# Patient Record
Sex: Female | Born: 1950 | Race: Asian | Hispanic: No | Marital: Married | Smoking: Never smoker
Health system: Southern US, Community
[De-identification: ages and names within clinical notes are randomized; demographics above are authoritative.]

## PROBLEM LIST (undated history)

## (undated) DIAGNOSIS — E785 Hyperlipidemia, unspecified: Secondary | ICD-10-CM

## (undated) DIAGNOSIS — K317 Polyp of stomach and duodenum: Secondary | ICD-10-CM

## (undated) DIAGNOSIS — K602 Anal fissure, unspecified: Secondary | ICD-10-CM

## (undated) DIAGNOSIS — N390 Urinary tract infection, site not specified: Secondary | ICD-10-CM

## (undated) DIAGNOSIS — K219 Gastro-esophageal reflux disease without esophagitis: Secondary | ICD-10-CM

## (undated) DIAGNOSIS — E039 Hypothyroidism, unspecified: Secondary | ICD-10-CM

## (undated) DIAGNOSIS — M1990 Unspecified osteoarthritis, unspecified site: Secondary | ICD-10-CM

## (undated) DIAGNOSIS — K5790 Diverticulosis of intestine, part unspecified, without perforation or abscess without bleeding: Secondary | ICD-10-CM

## (undated) DIAGNOSIS — K589 Irritable bowel syndrome without diarrhea: Secondary | ICD-10-CM

## (undated) DIAGNOSIS — M8580 Other specified disorders of bone density and structure, unspecified site: Secondary | ICD-10-CM

## (undated) DIAGNOSIS — K2970 Gastritis, unspecified, without bleeding: Secondary | ICD-10-CM

## (undated) DIAGNOSIS — F419 Anxiety disorder, unspecified: Secondary | ICD-10-CM

## (undated) DIAGNOSIS — K2270 Barrett's esophagus without dysplasia: Secondary | ICD-10-CM

## (undated) DIAGNOSIS — K8020 Calculus of gallbladder without cholecystitis without obstruction: Secondary | ICD-10-CM

## (undated) HISTORY — PX: DILATION AND CURETTAGE OF UTERUS: SHX78

## (undated) HISTORY — DX: Barrett's esophagus without dysplasia: K22.70

## (undated) HISTORY — DX: Gastritis, unspecified, without bleeding: K29.70

## (undated) HISTORY — PX: CATARACT EXTRACTION: SUR2

## (undated) HISTORY — DX: Anal fissure, unspecified: K60.2

## (undated) HISTORY — DX: Hypothyroidism, unspecified: E03.9

## (undated) HISTORY — DX: Other specified disorders of bone density and structure, unspecified site: M85.80

## (undated) HISTORY — DX: Diverticulosis of intestine, part unspecified, without perforation or abscess without bleeding: K57.90

## (undated) HISTORY — DX: Irritable bowel syndrome, unspecified: K58.9

## (undated) HISTORY — DX: Calculus of gallbladder without cholecystitis without obstruction: K80.20

## (undated) HISTORY — PX: TUBAL LIGATION: SHX77

## (undated) HISTORY — DX: Anxiety disorder, unspecified: F41.9

## (undated) HISTORY — DX: Gastro-esophageal reflux disease without esophagitis: K21.9

## (undated) HISTORY — DX: Hyperlipidemia, unspecified: E78.5

## (undated) HISTORY — DX: Urinary tract infection, site not specified: N39.0

## (undated) HISTORY — DX: Polyp of stomach and duodenum: K31.7

---

## 1998-07-20 ENCOUNTER — Other Ambulatory Visit: Admission: RE | Admit: 1998-07-20 | Discharge: 1998-07-20 | Payer: Self-pay | Admitting: Obstetrics and Gynecology

## 1999-09-13 ENCOUNTER — Other Ambulatory Visit: Admission: RE | Admit: 1999-09-13 | Discharge: 1999-09-13 | Payer: Self-pay | Admitting: Obstetrics and Gynecology

## 2002-05-15 ENCOUNTER — Other Ambulatory Visit: Admission: RE | Admit: 2002-05-15 | Discharge: 2002-05-15 | Payer: Self-pay | Admitting: Obstetrics and Gynecology

## 2003-08-11 ENCOUNTER — Other Ambulatory Visit: Admission: RE | Admit: 2003-08-11 | Discharge: 2003-08-11 | Payer: Self-pay | Admitting: Obstetrics and Gynecology

## 2004-08-31 ENCOUNTER — Other Ambulatory Visit: Admission: RE | Admit: 2004-08-31 | Discharge: 2004-08-31 | Payer: Self-pay | Admitting: Obstetrics and Gynecology

## 2004-09-04 ENCOUNTER — Ambulatory Visit: Payer: Self-pay | Admitting: Internal Medicine

## 2005-07-04 ENCOUNTER — Ambulatory Visit: Payer: Self-pay | Admitting: Internal Medicine

## 2005-07-09 ENCOUNTER — Ambulatory Visit: Payer: Self-pay | Admitting: Internal Medicine

## 2005-10-17 ENCOUNTER — Ambulatory Visit: Payer: Self-pay | Admitting: Internal Medicine

## 2006-02-13 ENCOUNTER — Other Ambulatory Visit: Admission: RE | Admit: 2006-02-13 | Discharge: 2006-02-13 | Payer: Self-pay | Admitting: Obstetrics and Gynecology

## 2006-06-21 ENCOUNTER — Ambulatory Visit: Payer: Self-pay | Admitting: Internal Medicine

## 2006-06-24 ENCOUNTER — Ambulatory Visit: Payer: Self-pay | Admitting: Internal Medicine

## 2006-06-24 ENCOUNTER — Ambulatory Visit (HOSPITAL_COMMUNITY): Admission: RE | Admit: 2006-06-24 | Discharge: 2006-06-24 | Payer: Self-pay | Admitting: Internal Medicine

## 2006-07-01 ENCOUNTER — Ambulatory Visit: Payer: Self-pay | Admitting: Internal Medicine

## 2006-12-09 ENCOUNTER — Ambulatory Visit: Payer: Self-pay | Admitting: Internal Medicine

## 2006-12-10 ENCOUNTER — Ambulatory Visit: Payer: Self-pay | Admitting: Internal Medicine

## 2006-12-10 ENCOUNTER — Encounter: Payer: Self-pay | Admitting: Internal Medicine

## 2006-12-17 ENCOUNTER — Ambulatory Visit: Payer: Self-pay | Admitting: Internal Medicine

## 2006-12-20 ENCOUNTER — Encounter: Payer: Self-pay | Admitting: Pulmonary Disease

## 2006-12-20 ENCOUNTER — Ambulatory Visit: Payer: Self-pay | Admitting: Internal Medicine

## 2006-12-23 ENCOUNTER — Encounter: Payer: Self-pay | Admitting: Pulmonary Disease

## 2007-04-11 ENCOUNTER — Ambulatory Visit: Payer: Self-pay | Admitting: Pulmonary Disease

## 2007-04-11 DIAGNOSIS — F411 Generalized anxiety disorder: Secondary | ICD-10-CM | POA: Insufficient documentation

## 2007-04-11 DIAGNOSIS — J45909 Unspecified asthma, uncomplicated: Secondary | ICD-10-CM | POA: Insufficient documentation

## 2007-04-11 DIAGNOSIS — J309 Allergic rhinitis, unspecified: Secondary | ICD-10-CM | POA: Insufficient documentation

## 2007-04-11 DIAGNOSIS — E785 Hyperlipidemia, unspecified: Secondary | ICD-10-CM | POA: Insufficient documentation

## 2007-04-11 DIAGNOSIS — K219 Gastro-esophageal reflux disease without esophagitis: Secondary | ICD-10-CM | POA: Insufficient documentation

## 2007-06-03 ENCOUNTER — Other Ambulatory Visit: Admission: RE | Admit: 2007-06-03 | Discharge: 2007-06-03 | Payer: Self-pay | Admitting: Obstetrics and Gynecology

## 2007-07-07 ENCOUNTER — Telehealth: Payer: Self-pay | Admitting: Internal Medicine

## 2007-07-07 ENCOUNTER — Encounter: Payer: Self-pay | Admitting: Internal Medicine

## 2007-10-24 ENCOUNTER — Ambulatory Visit: Payer: Self-pay | Admitting: Obstetrics and Gynecology

## 2007-12-11 DIAGNOSIS — Z872 Personal history of diseases of the skin and subcutaneous tissue: Secondary | ICD-10-CM | POA: Insufficient documentation

## 2007-12-11 DIAGNOSIS — K5730 Diverticulosis of large intestine without perforation or abscess without bleeding: Secondary | ICD-10-CM | POA: Insufficient documentation

## 2007-12-11 DIAGNOSIS — E119 Type 2 diabetes mellitus without complications: Secondary | ICD-10-CM | POA: Insufficient documentation

## 2007-12-11 DIAGNOSIS — K2270 Barrett's esophagus without dysplasia: Secondary | ICD-10-CM | POA: Insufficient documentation

## 2007-12-11 DIAGNOSIS — K589 Irritable bowel syndrome without diarrhea: Secondary | ICD-10-CM | POA: Insufficient documentation

## 2007-12-16 ENCOUNTER — Ambulatory Visit: Payer: Self-pay | Admitting: Internal Medicine

## 2008-01-29 ENCOUNTER — Ambulatory Visit: Payer: Self-pay | Admitting: Internal Medicine

## 2008-02-03 ENCOUNTER — Encounter: Payer: Self-pay | Admitting: Internal Medicine

## 2008-02-03 ENCOUNTER — Ambulatory Visit: Payer: Self-pay | Admitting: Internal Medicine

## 2008-02-04 ENCOUNTER — Encounter: Payer: Self-pay | Admitting: Internal Medicine

## 2008-06-01 ENCOUNTER — Telehealth: Payer: Self-pay | Admitting: Internal Medicine

## 2008-06-01 ENCOUNTER — Encounter: Payer: Self-pay | Admitting: Internal Medicine

## 2008-10-28 ENCOUNTER — Telehealth: Payer: Self-pay | Admitting: Internal Medicine

## 2008-11-01 ENCOUNTER — Other Ambulatory Visit: Admission: RE | Admit: 2008-11-01 | Discharge: 2008-11-01 | Payer: Self-pay | Admitting: Obstetrics and Gynecology

## 2008-11-01 ENCOUNTER — Encounter: Payer: Self-pay | Admitting: Obstetrics and Gynecology

## 2008-11-01 ENCOUNTER — Ambulatory Visit: Payer: Self-pay | Admitting: Obstetrics and Gynecology

## 2008-11-05 ENCOUNTER — Ambulatory Visit: Payer: Self-pay | Admitting: Internal Medicine

## 2009-05-24 ENCOUNTER — Ambulatory Visit: Payer: Self-pay | Admitting: Obstetrics and Gynecology

## 2009-06-01 ENCOUNTER — Ambulatory Visit: Payer: Self-pay | Admitting: Obstetrics and Gynecology

## 2009-06-29 ENCOUNTER — Ambulatory Visit: Payer: Self-pay | Admitting: Obstetrics and Gynecology

## 2009-09-08 ENCOUNTER — Ambulatory Visit: Payer: Self-pay | Admitting: Obstetrics and Gynecology

## 2009-11-08 ENCOUNTER — Ambulatory Visit: Payer: Self-pay | Admitting: Obstetrics and Gynecology

## 2009-11-08 ENCOUNTER — Other Ambulatory Visit: Admission: RE | Admit: 2009-11-08 | Discharge: 2009-11-08 | Payer: Self-pay | Admitting: Obstetrics and Gynecology

## 2009-11-22 ENCOUNTER — Ambulatory Visit: Payer: Self-pay | Admitting: Obstetrics and Gynecology

## 2009-11-28 ENCOUNTER — Ambulatory Visit: Payer: Self-pay | Admitting: Internal Medicine

## 2010-01-18 ENCOUNTER — Encounter: Payer: Self-pay | Admitting: Internal Medicine

## 2010-02-07 NOTE — Assessment & Plan Note (Signed)
Summary: ROUTINE F-UP/REFILL F-UP/FH    History of Present Illness Visit Type: follow up Primary GI MD: [name] MD Primary Provider: Johnn Hai MD Chief Complaint: f/u nexium refill History of Present Illness:   This is a 60 year old [place] female with a diagnosis of Barrett's esophagus which was made on an upper endoscopy in December 2008. She has been on Nexium 40 mg twice a day with complete relief of her symptoms. She comes for a refill of her medications. Patient is due for a recall endoscopy. She has been started on cimetidine 400 mg 3 times a day by her dermatologist for histamine 2 receptor suppression. Patient has a history of anal fissure which  is currently asymptomatic.   GI Review of Systems      Denies abdominal pain, acid reflux, belching, bloating, chest pain, dysphagia with liquids, dysphagia with solids, heartburn, loss of appetite, nausea, vomiting, vomiting blood, weight loss, and  weight gain.        Denies anal fissure, black tarry stools, change in bowel habit, constipation, diarrhea, diverticulosis, fecal incontinence, heme positive stool, hemorrhoids, irritable bowel syndrome, jaundice, light color stool, liver problems, rectal bleeding, and  rectal pain.     Prior Medications Reviewed Using: Patient Recall  Updated Prior Medication List: NEXIUM 40 MG  CPDR (ESOMEPRAZOLE MAGNESIUM) One tablet two times a day CIMETIDINE 400 MG TABS (CIMETIDINE) 1 three times a day ALLEGRA-D 24 HOUR 180-240 MG XR24H-TAB (FEXOFENADINE-PSEUDOEPHEDRINE) 1 by mouth once daily  Current Allergies (reviewed today): ! ERYTHROMYCIN ! PCN  Past Medical History:    Reviewed history from 12/11/2007 and no changes required:       Current Problems:        DIABETES MELLITUS (ICD-250.00)       DIVERTICULOSIS, COLON (ICD-562.10)       IRRITABLE BOWEL SYNDROME (ICD-564.1)       ANAL FISSURE, HX OF (ICD-V13.3)       BARRETTS ESOPHAGUS (ICD-530.85)       HYPERLIPIDEMIA  (ICD-272.4)       ASTHMA (ICD-493.90)       ANXIETY STATE, UNSPECIFIED (ICD-300.00)       G E REFLUX (ICD-530.81)       ASTHMA NOS W/O STATUS ASTHMATICUS (ICD-493.90)       ALLERGIC  RHINITIS (ICD-477.9)         Past Surgical History:    Reviewed history from 04/11/2007 and no changes required:       tubal ligation   Family History:    Reviewed history from 12/11/2007 and no changes required:       Allergies - son       Asthma - son       Cancer (lymphoma) - brother        No FH of Colon Cancer:  Social History:    Reviewed history from 12/11/2007 and no changes required:       Married       Futures trader       Recent travel to [place]       Patient never smoked.        Alcohol Use - no       Illicit Drug Use - no    Review of Systems       The patient complains of itching and skin rash.     Vital Signs:  Patient Profile:   60 Years Old Female Height:     63 inches Weight:      157.38 pounds BMI:  27.98 Pulse rate:   70 / minute BP sitting:   98 / 62  (left arm)  Vitals Entered By: Chales Abrahams CMA (December 16, 2007 8:32 AM)                  Physical Exam  General:     Well developed, well nourished, no acute distress. Lungs:     Clear throughout to auscultation. Heart:     Regular rate and rhythm; no murmurs, rubs,  or bruits. Abdomen:     Soft, nontender and nondistended. No masses, hepatosplenomegaly or hernias noted. Normal bowel sounds.    Impression & Recommendations:  Problem # 1:  BARRETTS ESOPHAGUS (ICD-530.85) asymptomatic Barrett's esophagus on Nexium 40 mg twice a day and Tagamet 400 mg 3 tabs a day. Patient denies any dysphagia or chest pain. We will schedule her for an upper endoscopy with repeat biopsies. We discussed the possibility of doing a 24-hour intraesophageal pH probe in case she has breakthrough symptoms on the current regimen.  Problem # 2:  ANAL FISSURE, HX OF (ICD-V13.3) Patient's fissure is asymptomatic. She very  rarely has a small amount of bleeding per rectum. I have suggested probiotics daily to regulate her bowel habits.  Problem # 3:  DIABETES MELLITUS (ICD-250.00) patient has lost intentionally 18 pounds by walking daily. Her hemoglobin A1c has improved and she is no longer on metformin.   Patient Instructions: 1)  refill on Nexium 40 mg twice a day 2)  Continue on Tagamet 400 mg p.o. t.i.d as per her dermatologist 3)  May hold Nexium 40 mg at bedtime and instead use Tagamet 4)  Antireflux measures 5)  Samples of probiotics 6)  Copy Sent To:Dr Johnn Hai  EGD has been scheduled for 02/03/08.  Patient to have a previsit for prep instructions on 01/29/08. Hortense Ramal CMA  December 16, 2007 10:05 AM   Prescriptions: NEXIUM 40 MG  CPDR (ESOMEPRAZOLE MAGNESIUM) One tablet two times a day  #60 x 11   Entered by:   Hortense Ramal CMA   Authorized by:   Hart Carwin MD   Signed by:   Hortense Ramal CMA on 12/16/2007   Method used:   Electronically to        [facility] [phone]* (retail)       [address]       [place], [place]  [address]       Ph: [phone] or [phone]       Fax: [phone]   RxID:   [id number]  ]

## 2010-02-07 NOTE — Progress Notes (Signed)
Summary: REQUESTING SAMPLES ASAP   Phone Note Call from Patient Call back at Home Phone [phone]   Caller: Patient Call For: BRODIE  Reason for Call: Talk to Nurse Summary of Call: BRODIE PT....REQUESTING NEXIUM SAMPLES ENOUGH FOR 2X PER DAY UNTIL [employer] SENDS NEXIUM THRU MAIL, PLEASE CONFIRM W/PT....LM IF NO ANSWER. Initial call taken by: Magdalen Spatz [facility],  July 07, 2007 12:05 PM  Follow-up for Phone Call        I am not sure that we have enough samples for any pt to take two times a day for 10-14 days ( 5 boxes?), she could get 2 boxes and supplement with OTC Prelosec 20mg  Follow-up by: Hart Carwin MD,  July 07, 2007 5:53 PM         Appended Document: REQUESTING SAMPLES ASAP Patient advised I have put some Nexium samples at front desk for her.  Appended Document: REQUESTING SAMPLES ASAP Patient never came to pick up samples.  Will put back in sample cabinet.

## 2010-02-07 NOTE — Procedures (Signed)
Summary: COLON   Colonoscopy  Procedure date:  06/24/2006  Findings:      Location:  [facility].   Patient Name: Catherine [name], Catherine [name] MRN:  Procedure Procedures: Colonoscopy CPT: [phone].  Personnel: Endoscopist: Tyna Huertas L. Juanda Chance, MD.  Exam Location: Exam performed in Endoscopy Suite.  Patient Consent: Procedure, Alternatives, Risks and Benefits discussed, consent obtained, from patient. Consent was obtained by the RN.  Indications Symptoms: Hematochezia. anal fissure, chronic, recurrent, .  Comments: intermittent rectal bleeding with history of recurrent ana fissure, pt not responding to conservative treatment History  Current Medications: Patient is not currently taking Coumadin.  Pre-Exam Physical: Performed Jun 24, 2006. Entire physical exam was normal. Rectal exam abnormal. Abnormal PE findings include: fibrous ridge at 3 o'clock at the site of the anal fissure, feels tender.  Comments: Pt. history reviewed/updated, physical exam performed prior to initiation of sedation?yes Exam Exam: Extent of exam reached: Cecum, extent intended: Cecum.  The cecum was identified by appendiceal orifice and IC valve. Duration of exam: In 3:30, out 6:05 min minutes. Colon retroflexion performed. Images taken. ASA Classification: I. Tolerance: good.  Monitoring: Pulse and BP monitoring, Oximetry used. Supplemental O2 given.  Colon Prep Used Miralax for colon prep. Prep results: good.  Sedation Meds: Patient assessed and found to be appropriate for moderate (conscious) sedation. Fentanyl 100 mcg. given IV. Versed 8 mg. given IV.  Findings - DIVERTICULOSIS: Sigmoid Colon. ICD9: Diverticulosis: 562.10. Comments: mild sigmoid diverticulosis, no obstruction.  - NORMAL EXAM: Cecum.  - FISSURE / FISTULA: Rectum. ICD9: Anal Fissure: 565.   Assessment Abnormal examination, see findings above.  Diagnoses: 562.10: Diverticulosis.  565: Anal Fissure.   Comments: no  polyps, healed anal fissure Events  Unplanned Interventions: No intervention was required.  Unplanned Events: There were no complications. Plans Medication Plan: Analpram @.5 %: prn, starting Jun 24, 2006   Patient Education: Patient given standard instructions for: Patient instructed to get routine colonoscopy every 10 years.  Comments: pt has an appointment at [facility] for assessment of anal fissure/ recurrent rectal bleeding, Disposition: After procedure patient sent to recovery.    This report was created from the original endoscopy report, which was reviewed and signed by the above listed endoscopist.

## 2010-02-07 NOTE — Progress Notes (Signed)
Summary: TRIAGE  9 Phone Note Call from Patient Call back at Home Phone [phone]   Caller: Patient Call For: Dr. Juanda Chance Reason for Call: Talk to Nurse Summary of Call: pt is leaving for a 2 month vacation in late November and would like to be seen by Dr. Juanda Chance for her annual exam before she leaves... nothing available before then... pt says she is not having any GI problems except needing a refill on Nexium esp since she'll be gone for 2 months Initial call taken by: Vallarie Mare,  October 28, 2008 9:57 AM  Follow-up for Phone Call        Pt. will see Dr.Tyona Nilsen on 11-05-08 at 2pm. Pt. instructed to call back as needed.  Follow-up by: Laureen Ochs LPN,  October 28, 2008 10:13 AM

## 2010-02-07 NOTE — Letter (Signed)
Summary: Patient Notice-Barrett's [facility]  [address] [place], [place] [address]   Phone: [phone]  Fax: [phone]        February 04, 2008 MRN: [id number]    [facility] [address] [place], [place]  [address]    Dear Ms. Lemarr,  I am pleased to inform you that the biopsies taken during your recent endoscopic examination did not show any evidence of cancer upon pathologic examination.The biopsies again show evidence of Barrett's esophagus  However, your biopsies indicate you have a condition known as Barrett's esophagus. While not cancer, it is pre-cancerous (can progress to cancer) and needs to be monitored with repeat endoscopic examination and biopsies.  Fortunately, it is quite rare that this develops into cancer, but careful monitoring of the condition along with taking your medication as prescribed is important in reducing the risk of developing cancer.  It is my recommendation that you have a repeat upper gastrointestinal endoscopic examination in _2 years.  Additional information/recommendations:  __Please call [phone] to schedule a return visit to further      evaluate your condition.  _x_Continue with treatment plan as outlined the day of your exam.  Please call us if you have or develop heartburn, reflux symptoms, any swallowing problems, or if you have questions about your condition that have not been fully answered at this time.  Sincerely,  Hart Carwin MD  This letter has been electronically signed by your physician.

## 2010-02-07 NOTE — Procedures (Signed)
Summary: F/U BARRETTS

## 2010-02-07 NOTE — Procedures (Signed)
Summary: EGD   EGD  Procedure date:  12/10/2006  Findings:      Location: [facility]   Patient Name: Catherine [name], Catherine [name] MRN:  Procedure Procedures: Panendoscopy (EGD) CPT: 43235.    with biopsy(s)/brushing(s). CPT: [id number].  Personnel: Endoscopist: Iliany Losier L. Juanda Chance, MD.  Exam Location: Exam performed in Outpatient Clinic. Outpatient  Patient Consent: Procedure, Alternatives, Risks and Benefits discussed, consent obtained, from patient. Consent was obtained by the RN.  Indications Symptoms: Chest Pain. Pulmonary symptoms, including:  Cough. shortness of breath.  History  Current Medications: Patient is not currently taking Coumadin.  Pre-Exam Physical: Performed Dec 10, 2006  Entire physical exam was normal. Abnormal PE findings include: fibrous ridge at 3 o'clock at the site of the anal fissure, feels tender.  Comments: Pt. history reviewed/updated, physical exam performed prior to initiation of sedation?yes Exam Exam Info: Maximum depth of insertion Duodenum, intended Duodenum. Vocal cords visualized. Gastric retroflexion performed. Images taken. ASA Classification: II. Tolerance: good.  Sedation Meds: Patient assessed and found to be appropriate for moderate (conscious) sedation. Fentanyl 50 mcg. given IV. Versed 7 mg. given IV. Cetacaine Spray 2 sprays given aerosolized.  Monitoring: BP and pulse monitoring done. Oximetry used. Supplemental O2 given  Findings - Normal: Distal Esophagus. Biopsy/Normal taken. Comments: r/o Barrett's esophagus, z-line appears fine.  - POLYP: in Fundus. Maximum size: 3 mm. Diminutive, sessile polyp. 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 Comments: multiple small fundic gland polyps present in the proximal stomach.  DIAGNOSTIC TEST: from Antrum. RUT done, results pending  Normal: Duodenal Apex.   Assessment Abnormal examination, see findings above.    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   Comments: small fundic gland polyps, no endoscopic evidence of reflux esophagitis, s/p biopsies from the g-e junction Events  Unplanned Intervention: No unplanned interventions were required.  Unplanned Events: There were no complications. Plans Medication(s): Await pathology. PPI: Esomeprazole/Nexium 40 mg BID, starting Dec 10, 2006   Comments: consider adding Reglan to her regimen Disposition: After procedure patient sent to recovery. After recovery patient sent home.    FINAL DIAGNOSIS    ***MICROSCOPIC EXAMINATION AND DIAGNOSIS***    1. STOMACH, POLYPS: BENIGN FUNDIC GLAND POLYPS.    2. ESOPHAGOGASTRIC JUNCTION, BIOPSIES:   - FRAGMENTS OF INFLAMED ESOPHAGEAL AND GASTRIC-TYPE MUCOSA   WITH FOCAL   INTESTINAL METAPLASIA CONSISTENT WITH BARRETT' S   MUCOSA.   - NO DYSPLASIA IDENTIFIED    COMMENT   1. A Warthin-Starry stain is performed to determine the   possibility of the presence of Helicobacter pylori. The   Warthin-Starry stain is negative for organisms of Helicobacter   pylori. The control(s) stained appropriately.    2. An Alcian Blue stain is performed to determine the presence   of intestinal metaplasia (goblet cell metaplasia). The Alcian   Blue stain shows focal intestinal metaplasia (goblet cell   metaplasia). The control stained appropriately.   (EAA:kv 12-12-06)    kv   Date Reported: 12/12/2006 Lyn Hollingshead. Delila Spence, MD   *** Electronically Signed Out By EAA ***    Clinical information   1. R/O adenoma   2. R/O Barrett' s   Chest pain. (gt)    specimen(s) obtained   1: Stomach, polyp(s)   2: Esophagogastric junction, biopsy    Gross Description   1. Received in formalin are tan, soft tissue fragments that are   submitted in toto. Number: 2   Size: Each 0.3 cm.    2.  Received in formalin are tan, soft tissue fragments that are   submitted in toto. Number: Multiple   Size: 0.3 to 0.5 cm. (GP:cdc  12/11/06)     cc/   Additional Information  [id number] RESULT STATUS : F  External IF Update Timestamp : 2006-12-11:13:21:00.000000 This report was created from the original endoscopy report, which was reviewed and signed by the above listed endoscopist.

## 2010-02-07 NOTE — Progress Notes (Signed)
Summary: prior autho   Phone Note Call from Patient Call back at [facility] Phone [phone]   Caller: Patient Call For: Brodi Nery  Reason for Call: Talk to Nurse Summary of Call: ins co [employer] needs prior approval on Nexium will expire in June please call ins co # [phone] ---ID   #[id number] Initial call taken by: Guadlupe Spanish [facility],  Jun 01, 2008 12:11 PM  Follow-up for Phone Call        Approved thru [facility] for lifetime of patient's plan.  Patient to recieve automated call to  advise her of this. Follow-up by: Hortense Ramal CMA,  Jun 01, 2008 12:22 PM

## 2010-02-07 NOTE — Assessment & Plan Note (Signed)
Summary: PERSISTANT COUGH////KP   Visit Type:  Initial Consult Referred by:  Dr. Laurine Blazer PCP:  Laurine Blazer  Chief Complaint:  Pulmonary Consult - persistant cough & asthma.  History of Present Illness: 60 y.o woman fro [place] emigrated to [place] since '73 developed URI >> 12/08  chest tightness & productive cough, no wheezing cardiac evaln by Dr. Juel Burrow ([place]) neg stress test & echo (for valvular dz) Spirometry - mild obstruction , 'asthma ' since '97 , advair made her jittery & nervous Xanax seemed to help, denies stressors in family or marriage, younger son's wedding planned in 7/09 recurrent URI >> z-pak, mucinex did not help much , Codeine syrup 'really helped' within 2-3 doses.  GERD - since '95, Dr. Juanda Chance, Barrett's esophagus on reecent EGD       Current Allergies: No known allergies   Past Medical History:    Reviewed history and no changes required:       Asthma       Diabetes       Hyperlipidemia  Past Surgical History:    Reviewed history and no changes required:       tubal ligation   Family History:    Reviewed history and no changes required:       Allergies - son       Asthma - son       Cance (lymphoma)r - brother   Social History:    Reviewed history and no changes required:       Married       Futures trader       Recent travel to [place]       Patient never smoked.    Risk Factors:  Tobacco use:  never    Vital Signs:  Patient Profile:   60 Years Old Female Height:     63 inches Weight:      156 pounds O2 Sat:      97 % Temp:     98.4 degrees F oral Pulse rate:   78 / minute BP sitting:   118 / 82  (left arm)  Vitals Entered By: Marinus Maw (April 11, 2007 10:51 AM)             Comments Medications reviewed with patient ..................................................................Marland KitchenMarinus Maw  April 11, 2007 10:52 AM       Physical Exam  General:     HEENT - no thrush, no post nasal drip No  JVD, no lymphadenopathy CVS- s1s2 nml, no murmur RS- clear, no crackles or rhonchi Abd- soft, non-tender, no organomegaly CNS- non focal Ext - no edema  Mouth:     postnasal drip.       Problem # 1:  ALLERGIC  RHINITIS (ICD-477.9) ct nasonex 1 spray each nare q hs  Problem # 2:  ASTHMA NOS W/O STATUS ASTHMATICUS (ICD-493.90) Proventil MDI as needed only, mild obstrauction on spirometry (Dr. Fredna Dow office) Advair made her jittery, nervous FEV1/ FVC 70, FEV1 1.89 CXR neg  Problem # 3:  G E REFLUX (ICD-530.81) Barrett's per Dr. Juanda Chance will need surveillance. No breakthrough symptoms but cough may be the manifestation Her updated medication list for this problem includes:    Nexium 40 Mg Cpdr (Esomeprazole magnesium) ..... One tablet two times a day   Problem # 4:  ANXIETY STATE, UNSPECIFIED (ICD-300.00) No clear stressors except son's planned wedding - she wil stay on hs xanax until 7/09 then attempt to taper - Doubt need for psychiatric evaln.Did not tolerate celexa  Her updated medication list for this problem includes:    Alprazolam 0.25 Mg Tabs (Alprazolam) .Marland Kitchen... Take once daily   Medications Added to Medication List This Visit: 1)  Metformin Hcl 500 Mg Tabs (Metformin hcl) .... Take one tablet once daily 2)  Nexium 40 Mg Cpdr (Esomeprazole magnesium) .... One tablet two times a day 3)  Alprazolam 0.25 Mg Tabs (Alprazolam) .... Take once daily 4)  Centrum Silver Tabs (Multiple vitamins-minerals) 5)  Promethazine-codeine 6.25-10 Mg/109ml Syrp (Promethazine-codeine) .... Take one teaspoon at night -   Patient Instructions: 1)  Please schedule a follow-up appointment in 3 months.    ]

## 2010-02-07 NOTE — Assessment & Plan Note (Signed)
Summary: f/u--ch.    History of Present Illness Visit Type: Follow-up Visit Primary GI MD: [name] MD Primary Provider: Johnn Hai MD Chief Complaint: Annual visit, patient has a small spot of blood from fissure once and a while, with some constipation History of Present Illness:   This is a 60 year old [place] female with long-standing gastroesophageal reflux and Barrett's esophagus found on an upper endoscopy in December 2008 and again in January 2010. 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 A colonoscopy in June 2008 showed an anal fissure. Her anal fissure has been intermittently symptomatic. She uses Vaseline. She denies being constipated. She takes Nexium 40 mg twice a day.Her anal fissure was evaluated at [facility] by Dr Luciano Cutter and he felt that the fissure is very small and did not think surgery was indicated. he was concerned about pt using too much topical steroids .   GI Review of Systems      Denies abdominal pain, acid reflux, belching, bloating, chest pain, dysphagia with liquids, dysphagia with solids, heartburn, loss of appetite, nausea, vomiting, vomiting blood, weight loss, and  weight gain.      Reports anal fissure and constipation.     Denies black tarry stools, change in bowel habit, diarrhea, diverticulosis, fecal incontinence, heme positive stool, hemorrhoids, irritable bowel syndrome, jaundice, light color stool, liver problems, rectal bleeding, and  rectal pain.    Current Medications (verified): 1)  Nexium 40 Mg  Cpdr (Esomeprazole Magnesium) .... Take 1 Tablet By Mouth Two Times A Day 2)  Allegra-D 24 Hour 180-240 Mg Xr24h-Tab (Fexofenadine-Pseudoephedrine) .Marland Kitchen.. 1 By Mouth Once Daily 3)  Culturelle Digestive Health  Caps (Lactobacillus-Inulin) .... One Tablet By Mouth Once Daily 4)  Actos 15 Mg Tabs (Pioglitazone Hcl) .... Once Daily 5)  Multivitamins  Tabs (Multiple Vitamin) .... Once  Daily  Allergies (verified): 1)  ! Erythromycin 2)  ! Pcn  Past History:  Past Medical History: Reviewed history from 12/11/2007 and no changes required. Current Problems:  DIABETES MELLITUS (ICD-250.00) DIVERTICULOSIS, COLON (ICD-562.10) IRRITABLE BOWEL SYNDROME (ICD-564.1) ANAL FISSURE, HX OF (ICD-V13.3) BARRETTS ESOPHAGUS (ICD-530.85) HYPERLIPIDEMIA (ICD-272.4) ASTHMA (ICD-493.90) ANXIETY STATE, UNSPECIFIED (ICD-300.00) G E REFLUX (ICD-530.81) ASTHMA NOS W/O STATUS ASTHMATICUS (ICD-493.90) ALLERGIC  RHINITIS (ICD-477.9)  Past Surgical History: Reviewed history from 04/11/2007 and no changes required. tubal ligation  Family History: Reviewed history from 12/11/2007 and no changes required. Allergies - son Asthma - son Cancer (lymphoma) - brother  No FH of Colon Cancer:  Social History: Reviewed history from 12/11/2007 and no changes required. 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  Alcohol Use - no Illicit Drug Use - no  Review of Systems       The patient complains of allergy/sinus.  The patient denies anemia, anxiety-new, arthritis/joint pain, back pain, blood in urine, breast changes/lumps, change in vision, confusion, cough, coughing up blood, depression-new, fainting, fatigue, fever, headaches-new, hearing problems, heart murmur, heart rhythm changes, itching, menstrual pain, muscle pains/cramps, night sweats, nosebleeds, pregnancy symptoms, shortness of breath, skin rash, sleeping problems, sore throat, swelling of feet/legs, swollen lymph glands, thirst - excessive , urination - excessive , urination changes/pain, urine leakage, vision changes, and voice change.         Pertinent positive and negative review of systems were noted in the above HPI. All other ROS was otherwise negative.   Vital Signs:  Patient profile:   60 year old female Height:  63 inches Weight:      175.13 pounds BMI:     31.14 Pulse rate:   68 /  minute Pulse rhythm:   regular BP sitting:   100 / 60  (right arm) Cuff size:   regular  Vitals Entered By: June McMurray CMA [name]) (November 28, 2009 4:23 PM)  Physical Exam  General:  Well developed, well nourished, no acute distress. Eyes:  PERRLA, no icterus. Mouth:  No deformity or lesions, dentition normal. Breasts:  No masses, tenderness or gynecomastia noted. Lungs:  Clear throughout to auscultation. Heart:  Regular rate and rhythm; no murmurs, rubs,  or bruits. Abdomen:  Soft, nontender and nondistended. No masses, hepatosplenomegaly or hernias noted. Normal bowel sounds. Rectal:  rectal and anoscopic exam showed a small skin tag externally. 2 tiny superficial anal fissures at 3 and 7:00 showed some bleeding. Minimal tenderness. There were small internal hemorrhoids but no prolapse. Stool is Hemoccult negative.   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 She will continue to use topical lubricants, bacitracin or vitamin D ointment. She is up-to-date on her colonoscopy.  Problem # 2:  BARRETTS ESOPHAGUS (ICD-530.85) She is due for repeat upper endoscopy in January 2012. We will schedule it at that time. She should resume Nexium 40 mg twice a day.  Patient Instructions: 1)  Please pick up your prescriptions at the pharmacy. Electronic prescription(s) has already been sent for Nexium 40 mg two times a day. 2)  You have been given samples of Nexium to take. 3)  Copy sent to : Johnn Hai, MD 4)  The medication list was reviewed and reconciled.  All changed / newly prescribed medications were explained.  A complete medication list was provided to the patient / caregiver. Prescriptions: NEXIUM 40 MG  CPDR (ESOMEPRAZOLE MAGNESIUM) Take 1 tablet by mouth two times a day  #60 x 10   Entered by:   Lamona Curl CMA (AAMA)   Authorized by:   Hart Carwin MD    Signed by:   Lamona Curl CMA (AAMA) on 11/28/2009   Method used:   Electronically to        [phone][facility] (retail)       [address]       [place], [place]  [address]       Ph: [phone] or [phone]       Fax: [phone]   RxID:   [id number]

## 2010-02-07 NOTE — Miscellaneous (Signed)
Summary: EGD 02/03/08 - pre visit  Clinical Lists Changes

## 2010-02-07 NOTE — Assessment & Plan Note (Signed)
Summary: ANNUAL OV               Catherine [name]    History of Present Illness Visit Type: Follow-up Visit Primary GI MD: [name] MD Primary Provider: Johnn Hai MD Chief Complaint: Yearly f/u. Pt states she put her self on a probiotic that is helping her BM's become more regular.  History of Present Illness:   This is a 60 year old [place] female with a diagnosis of Barrett's esophagus which was made on an upper endoscopy in December 2008. She has been on Nexium 40 mg twice a day with complete relief of her symptoms. Patient's last endoscopy done 02/03/08 showed a normal GE junction and multiple polyps in the body of the stomach. Patient had a normal duodenum and  normal stomach in the antrum. 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 A stomach polyp was found to be hyperplastic. Patient comes today for refills of her medications and for a routine check up. She denies any current gastroentestinal symptoms.    GI Review of Systems      Denies abdominal pain, acid reflux, belching, bloating, chest pain, dysphagia with liquids, dysphagia with solids, heartburn, loss of appetite, nausea, vomiting, vomiting blood, weight loss, and  weight gain.        Denies anal fissure, black tarry stools, change in bowel habit, constipation, diarrhea, diverticulosis, fecal incontinence, heme positive stool, hemorrhoids, irritable bowel syndrome, jaundice, light color stool, liver problems, rectal bleeding, and  rectal pain.    Current Medications (verified): 1)  Nexium 40 Mg  Cpdr (Esomeprazole Magnesium) .... One Tablet Two Times A Day 2)  Allegra-D 24 Hour 180-240 Mg Xr24h-Tab (Fexofenadine-Pseudoephedrine) .Marland Kitchen.. 1 By Mouth Once Daily 3)  Culturelle Digestive Health  Caps (Lactobacillus-Inulin) .... One Tablet By Mouth Once Daily  Allergies (verified): 1)  ! Erythromycin 2)  ! Pcn  Past History:  Past Medical History: Reviewed history  from 12/11/2007 and no changes required. Current Problems:  DIABETES MELLITUS (ICD-250.00) DIVERTICULOSIS, COLON (ICD-562.10) IRRITABLE BOWEL SYNDROME (ICD-564.1) ANAL FISSURE, HX OF (ICD-V13.3) BARRETTS ESOPHAGUS (ICD-530.85) HYPERLIPIDEMIA (ICD-272.4) ASTHMA (ICD-493.90) ANXIETY STATE, UNSPECIFIED (ICD-300.00) G E REFLUX (ICD-530.81) ASTHMA NOS W/O STATUS ASTHMATICUS (ICD-493.90) ALLERGIC  RHINITIS (ICD-477.9)  Past Surgical History: Reviewed history from 04/11/2007 and no changes required. tubal ligation  Family History: Reviewed history from 12/11/2007 and no changes required. Allergies - son Asthma - son Cancer (lymphoma) - brother  No FH of Colon Cancer:  Social History: Reviewed history from 12/11/2007 and no changes required. 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  Alcohol Use - no Illicit Drug Use - no  Review of Systems  The patient denies allergy/sinus, anemia, anxiety-new, arthritis/joint pain, back pain, blood in urine, breast changes/lumps, change in vision, confusion, cough, coughing up blood, depression-new, fainting, fatigue, fever, headaches-new, hearing problems, heart murmur, heart rhythm changes, itching, menstrual pain, muscle pains/cramps, night sweats, nosebleeds, pregnancy symptoms, shortness of breath, skin rash, sleeping problems, sore throat, swelling of feet/legs, swollen lymph glands, thirst - excessive , urination - excessive , urination changes/pain, urine leakage, vision changes, and voice change.         Pertinent positive and negative review of systems were noted in the above HPI. All other ROS was otherwise negative.   Vital Signs:  Patient profile:   60 year old female Height:      63 inches Weight:      172 pounds BMI:     30.58 Pulse rate:  80 / minute Pulse rhythm:   regular BP sitting:   110 / 68  (right arm) Cuff size:   regular  Vitals Entered By: [name] CMA [name]) (November 05, 2008 2:09  PM)  Physical Exam  General:  alert, oriented anf=d in no distress. Eyes:  PERRLA, no icterus. Mouth:  No deformity or lesions, dentition normal. Neck:  Supple; no masses or thyromegaly. Lungs:  Clear throughout to auscultation. Heart:  Regular rate and rhythm; no murmurs, rubs,  or bruits. Extremities:  No clubbing, cyanosis, edema or deformities noted. Psych:  Alert and cooperative. Normal mood and affect.   Impression & Recommendations:  Problem # 1:  BARRETTS ESOPHAGUS (ICD-530.85) Patient will be due for a recall upper endoscopy in January 2012. She is to continue Nexium 40 mg twice a day.  Problem # 2:  ANAL FISSURE, HX OF (ICD-V13.3) Patient has a history of an anal fissure which has now healed. Her last colonoscopy in June 2008 showed mild diverticulosis of the left colon. She is  due for  repeat colonoscopy in June 2018.  Patient Instructions: 1)  Nexium 40 mg p.o. b.i.d. 2)  Antireflux measures. 3)  Use Vaseline p.r.n. rectal irritation. 4)  Recall colonoscopy June 2018. 5)  Recall upper endoscopy for Barrett's esophagus January 2012. Prescriptions: NEXIUM 40 MG  CPDR (ESOMEPRAZOLE MAGNESIUM) Take 1 tablet by mouth two times a day  #60 x 11   Entered by:   Hortense Ramal CMA (AAMA)   Authorized by:   Hart Carwin MD   Signed by:   Hortense Ramal CMA (AAMA) on 11/05/2008   Method used:   [phone]       [address]       [place], [place]  [address]       Ph: [phone] or [phone]       Fax: [phone]   RxID:   [id number]

## 2010-02-07 NOTE — Procedures (Signed)
Summary: EGD   EGD  Procedure date:  02/03/2008  Findings:      Location: [facility]    Procedures Next Due Date:    EGD: 02/2010  ENDOSCOPY PROCEDURE REPORT  PATIENT:  Shannie, Kontos  MR#:  [id number] BIRTHDATE:   04-Nov-1950   GENDER:   female  ENDOSCOPIST:   Hedwig Morton. Juanda Chance, MD Referred by: Mila Palmer, M.D.  PROCEDURE DATE:  02/03/2008 PROCEDURE:  EGD with biopsy ASA CLASS:   Class I INDICATIONS: h/o Barrett's Esophagus intestinal metaplasia on EGD 12/08, hx of GERD, pt has been on Nexiem 40 mg bid  MEDICATIONS:    Versed 6 mg, Fentanyl 50 mcg TOPICAL ANESTHETIC:   Cetacaine Spray  DESCRIPTION OF PROCEDURE:   After the risks benefits and alternatives of the procedure were thoroughly explained, informed consent was obtained.  The LB GIF-H180 [id number] endoscope was introduced through the mouth and advanced to the second portion of the duodenum, without limitations.  The instrument was slowly withdrawn as the mucosa was fully examined. <<PROCEDUREIMAGES>>        <<OLD IMAGES>>  Normal GE junction was noted. With standard forceps, a biopsy was obtained and sent to pathology (see image5).  There were multiple polyps identified. in the body of the stomach. multiple fundic gland polyps in the body With standard forceps, a biopsy was obtained and sent to pathology (see image4 and image1).  The duodenal bulb was normal in appearance, as was the postbulbar duodenum (see image2).  The stomach was entered and closely examined. The antrum, angularis, and lesser curvature were well visualized, including a retroflexed view of the cardia and fundus. The stomach wall was normally distensable. The scope passed easily through the pylorus into the duodenum. in the antrum (see image3).    Retroflexed views revealed Retroflexion exam demonstrated findings as previously described.  multiple polyps  The scope was then withdrawn from the patient and the procedure  completed.  COMPLICATIONS:   None  ENDOSCOPIC IMPRESSION:  1) Normal GE junction  2) Polyps, multiple in the body of the stomach  3) Normal duodenum  4) Normal stomach in the antrum  s/p biopsies at the g-e junction RECOMMENDATIONS:  1) Nexium 40 mg  2) anti-reflux regimen  bid  repeat EGD in 2 years  REPEAT EXAM:   In 2 year(s) for.   _______________________________ Hedwig Morton. Juanda Chance, MD    CC: Mila Palmer, MD    REPORT OF SURGICAL PATHOLOGY   Case #: [id number] Patient Name: HONESTIE, KULIK A. Office Chart Number:  [id number]   MRN: [id number] Pathologist: Ferd Hibbs. Colonel Bald, MD DOB/Age  March 04, 1950 (Age: 60)    Gender: F Date Taken:  02/03/2008 Date Received: 02/03/2008   FINAL DIAGNOSIS   ***MICROSCOPIC EXAMINATION AND DIAGNOSIS***   1.  STOMACH, POLYP:   - HYPERPLASTIC POLYP.   - MALIGNANT FEATURES ARE NOT IDENTIFIED.  - SEE COMMENT.     2.  GASTROESOPHAGEAL JUNCTION, BIOPSY:  - INTESTINAL METAPLASIA (GOBLET CELL METAPLASIA) CONSISTENT WITH BARRETT' S  ESOPHAGUS.   - THERE IS NO EVIDENCE OF DYSPLASIA OR MALIGNANCY.  - SEE  COMMENT.   COMMENT 1.  A Warthin-Starry stain is performed to determine the possibility of the presence of Helicobacter pylori. The Warthin-Starry stain is negative for organisms of Helicobacter pylori. The control(s) stained appropriately.   2.  An Alcian Blue stain is performed to determine the presence of intestinal metaplasia (goblet cell metaplasia). The Alcian Blue stain shows intestinal metaplasia (goblet cell metaplasia).  The  control stained appropriately.   cc Date Reported:  02/04/2008     Ivin Booty B. Colonel Bald, MD    February 04, 2008 MRN: [id number]  [facility] [address] [place], [place]  [address]  Dear Ms. Palmieri,  I am pleased to inform you that the biopsies taken during your recent endoscopic examination did not show any evidence of cancer upon pathologic examination.The biopsies again show  evidence of Barrett's esophagus  However, your biopsies indicate you have a condition known as Barrett's esophagus. While not cancer, it is pre-cancerous (can progress to cancer) and needs to be monitored with repeat endoscopic examination and biopsies.  Fortunately, it is quite rare that this develops into cancer, but careful monitoring of the condition along with taking your medication as prescribed is important in reducing the risk of developing cancer.  It is my recommendation that you have a repeat upper gastrointestinal endoscopic examination in _2 years.  Additional information/recommendations:  __Please call [phone] to schedule a return visit to further      evaluate your condition.  _x_Continue with treatment plan as outlined the day of your exam.  Please call us if you have or develop heartburn, reflux symptoms, any swallowing problems, or if you have questions about your condition that have not been fully answered at this time.  Sincerely,  Hart Carwin MD  This report was created from the original endoscopy report, which was reviewed and signed by the above listed endoscopist.

## 2010-02-07 NOTE — Medication Information (Signed)
Summary: Approval for Nexium/[employer]  Approval for Nexium/[employer]   Imported By: Esmeralda Links D'jimraou 07/08/2007 10:31:03  _____________________________________________________________________  External Attachment:    Type:   Image     Comment:   External Document

## 2010-02-07 NOTE — Medication Information (Signed)
Summary: NEXIUM approved/[employer]  NEXIUM approved/[employer]   Imported By: Lester [place] 06/04/2008 11:54:58  _____________________________________________________________________  External Attachment:    Type:   Image     Comment:   External Document

## 2010-02-07 NOTE — Miscellaneous (Signed)
Summary: Nexium Prior Auth  Per [employer], Patient's Nexium has been approved from 06/16/07-07/06/2008.  Case ID is [id number]. Faxed approval letter to [employer]. Hortense Ramal CMA  July 07, 2007 9:12 AM

## 2010-02-09 NOTE — Letter (Signed)
Summary: Endoscopy Letter  [facility]  [address] [place], [place] [address]   Phone: [phone]  Fax: [phone]      January 18, 2010 MRN: [id number]   [facility] [address] [place], [place]  [address]   Dear Ms. Lennon,   According to your medical record, it is time for you to schedule an Endoscopy. Endoscopic screening is recommended for patients with certain upper digestive tract conditions because of associated increased risk for cancers of the upper digestive system.  This letter has been generated based on the recommendations made at the time of your prior procedure. If you feel that in your particular situation this may no longer apply, please contact our office.  Please call our office at [phone]) to schedule this appointment or to update your records at your earliest convenience.  Thank you for cooperating with [place] to provide you with the very best care possible.   Sincerely,  Hedwig Morton. Juanda Chance, M.D.  [facility] Gastroenterology Division [phone]

## 2010-04-06 ENCOUNTER — Ambulatory Visit (INDEPENDENT_AMBULATORY_CARE_PROVIDER_SITE_OTHER): Payer: BC Managed Care – PPO | Admitting: Obstetrics and Gynecology

## 2010-04-06 DIAGNOSIS — N764 Abscess of vulva: Secondary | ICD-10-CM

## 2010-04-06 DIAGNOSIS — B373 Candidiasis of vulva and vagina: Secondary | ICD-10-CM

## 2010-04-19 ENCOUNTER — Ambulatory Visit (INDEPENDENT_AMBULATORY_CARE_PROVIDER_SITE_OTHER): Payer: BC Managed Care – PPO | Admitting: Obstetrics and Gynecology

## 2010-04-19 DIAGNOSIS — N764 Abscess of vulva: Secondary | ICD-10-CM

## 2010-04-27 ENCOUNTER — Ambulatory Visit (INDEPENDENT_AMBULATORY_CARE_PROVIDER_SITE_OTHER): Payer: BC Managed Care – PPO | Admitting: Obstetrics and Gynecology

## 2010-04-27 DIAGNOSIS — Z9889 Other specified postprocedural states: Secondary | ICD-10-CM

## 2010-05-23 NOTE — Assessment & Plan Note (Signed)
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 She  was evaluated by Dr. Kendrick Ranch on two occasions and diagnosed with mild  anal stenosis. She was not interested in surgical repair. The second  time she saw Dr. Earlene Plater, he suggested that she be evaluated at [facility] by a  rectal surgeon to assess the need for a surgical repair of her chronic  anal fissure. The patient continues to have intermittent rectal  bleeding, as well as occasional bloating and gas, for which she takes  Gas-X. She has a known history of irritable bowel syndrome,  gastroesophageal reflux disease, and a positive family history of colon  polyps. She has never had a colonoscopy, although I have been trying to  schedule a colonoscopy for her for several years now. She always has  reasons that she does not want it done. Most recently, because of a  bleeding anal fissure, but I told her this time that we need to move  ahead and schedule the colonoscopy because of the recurrent rectal  bleeding.   PHYSICAL EXAMINATION:  VITAL SIGNS:  Blood pressure 106/74, pulse 88,  weight 172 pounds.  GENERAL:  She was alert and oriented.  LUNGS:  Clear to auscultation.  CARDIAC:  Normal S1, normal S2.  ABDOMEN:  Soft, relaxed, with decreased muscle tone, minimal tenderness  in the right lower quadrant, no palpable mass, normal active bowel  sounds.  RECTAL:  Shows normal perianal area. Rectal tone was normal. There was a  palpable anal fissure at 3 o'clock about 1/2 inch long, it was closed  and it did not open up or bleed. It was somewhat tender. There were no  internal hemorrhoids. Stool was hemoccult negative.   IMPRESSION:  1. This is a 60 year old female with chronic anal  fissure causing      intermittent rectal bleeding, at this point not responsive to      topical steroids, stool softeners, or rectal care.  2. Gastroesophageal reflux.  3. History of irritable bowel syndrome.   PLAN:  1. The patient is to keep her appointment at [facility] for 06/25/06.  2. Schedule colonoscopy to assess rectal bleeding.  3. I told the patient that any rectal surgery, before it is done, that      we need to have a certainty that there is no other lesions in the      colon that could cause bleeding.  4. Continue to use Vaseline for the rectum p.r.n. constipation.  5. May use Probiotics p.r.n. digestive symptoms.     Hedwig Morton. Juanda Chance, MD  Electronically Signed    DMB/MedQ  DD: 06/21/2006  DT: 06/22/2006  Job #: [id number]   cc:   [name], M.D.

## 2010-05-23 NOTE — Assessment & Plan Note (Signed)
[facility]                         GASTROENTEROLOGY OFFICE NOTE   Catherine [name], Catherine [name]                       MRN:          [id number]  DATE:12/09/2006                            DOB:          Oct 28, 1950    Catherine [name] is a very nice 60 year old female with history of chronic anal  fissure and a history of gastroesophageal reflux disease.  At this time,  she comes for evaluation of intermittent chest pain and intermittent  sensation of spasm or palpitation as she describes it, which may be  occurring when she walks up the stairs or just sitting watching TV.  Since we have put her on Nexium 40 mg twice a day last week, her  symptoms have improved about 50%.  We have also put her on Donnatal  antispasmodic, which she has taken only at night with complete relief of  her sensation.  She denies any dysphagia or odynophagia.  She is new  onset diabetic controlled by metformin.  Catherine [name] had an  electrocardiogram, TSH, and serum magnesium level last week in Dr.  Isac Sarna office, which were all normal.  She drinks only 1 cup of tea a  day.  No coffee.  No alcohol.  She does not smoke.  She does not have  excessive stress, although she has been somewhat anxious over the years  that we have followed her.  The other medical problems have been  irritable bowel syndrome, gas pains, anal fissure, being evaluated by  Dr. Kendrick Ranch.  Her last colonoscopy was in June 2008 and showed  confirmed presence of anal fissure and mild left colon diverticulosis.   MEDICATIONS:  1. Nexium 40 mg p.o. b.i.d.  2. Calcium.  3. Multiple vitamins.  4. Fish oil.  5. Metformin 500 mg b.i.d.  6. Servira 1 nightly.   PHYSICAL EXAM:  Blood pressure 112/78, pulse 64, weight 172 pounds,  which is the same as last time.  She was alert and oriented.  No distress.  Sclerae not icteric.  There was no resting tremor of the hands.  NECK:  Supple.  No lymphadenopathy.  LUNGS:  Clear to  auscultation.  Her voice was normal.  COR:  Normal S1, normal S2.  No irregularities.  Precordium was quiet.  Costochondral junctions were nontender.  ABDOMEN:  Soft.  Subxyphoid and epigastric area was normal.  I could not  elicit any discomfort.  Lower abdomen was normal.  RECTAL:  Not done.  EXTREMITIES:  No edema.   IMPRESSION:  A 60 year old [place] female with new onset of  palpitation-like sensation in the substernal area.  Negative cardiac  workup.  Improvement of her symptoms about 50% on increased dose of  proton pump inhibitor.  She has never had an upper endoscopy, and the  possibility of hiatal hernia or gastroesophageal reflux disease in a  differential diagnosis.  H. pylori gastropathy as well.  She has been  quite anxious over the years, and esophageal spasm could be a reason for  her chest pain as well.  I still have not ruled out the possibility that  she may  have cardiac arrhythmias, because she has not had an event  monitor.   PLAN:  1. Minimize caffeine intake.  2. Antireflux measures, which will include elevation of the head of      the bed at night.  3. Continue Nexium 40 mg p.o. b.i.d.  4. Upper endoscopy and biopsies scheduled for this week.  5. Continue antispasmodic at night, possible for during the day.      Consider 24 hour pH probe to assess for residual reflux not covered      by Nexium.     Hedwig Morton. Juanda Chance, MD  Electronically Signed    DMB/MedQ  DD: 12/09/2006  DT: 12/09/2006  Job #: [id number]   cc:   [name], M.D.

## 2010-05-23 NOTE — Assessment & Plan Note (Signed)
[facility]                         GASTROENTEROLOGY OFFICE NOTE   LASHEA, GODA                       MRN:          [id number]  DATE:12/17/2006                            DOB:          11/19/1950    Ms. Madore is a 60 year old [place] woman who was just diagnosed with  Barrett's esophagus on upper endoscopy on December 10, 2006.  The biopsy  of the GE junction read by Dr. 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  There was  also mild inflammation.  There was no dysplasia.  She is currently on  Nexium 40 mg p.o. b.i.d.  All of her symptoms of reflux have resolved.  She still has some spasms in her stomach, and I think that is due to  irritable bowel syndrome.   Today we have discussed extensively Barrett's esophagus.  I spent about  30-40 minutes with her.  Many questions concerning her diet and  antireflux measures, possible treatments, and followup.  We will plan to  repeat upper endoscopy in 1 year, keep her on Nexium 40 mg twice a day,  and antireflux measures.  I would like to see her in the office in about  3 months for followup.     Hedwig Morton. Juanda Chance, MD  Electronically Signed    DMB/MedQ  DD: 12/17/2006  DT: 12/17/2006  Job #: [id number]   cc:   [name], M.D.

## 2010-05-26 NOTE — Letter (Signed)
October 17, 2005    Timothy E. Earlene Plater, M.D.  [address]  [place], [place] [address]   RE:  Catherine [name], Catherine [name]  MRN:  [id number]  /  DOB:  11-24-1950   Dear Jorja Loa:   I would appreciate you seeing Catherine [name], who is a 60 year old [place] female,  for chronic anal fissure.  I have followed her for at least 10 years for  nonulcer dyspepsia and irritable bowel syndrome.  In 2005 we treated her  successfully for anal fissure confirmed on anoscopic exam.  She had  recurrence of the fissure earlier this year, and I saw her for it on July 04, 2005, and put her on topical steroid, suppositories, stool softener, as  well as Calmoseptine solution.  Today on anoscopic exam she again has the  same fissure, which has not healed a bit and is causing discomfort as well  as some intermittent rectal bleeding.  She has been scheduled for  colonoscopy now several times but we had to cancel because of the recurrence  of rectal bleeding and fissure, which would make the prep quite painful for  her, so I would prefer for her fissure to be fixed before we proceed with  the colonoscopy.  She does have a positive family history of colon polyps in  her parents but otherwise I really do not suspect any colon malignancy.  She  is a very compliant patient and currently is continued on her Analpram cream  topically.   I appreciate your assistance with this nice lady.   Sincerely,    Hedwig Morton. Juanda Chance, MD   DMB/MedQ  /  Job #:  [id number]  DD:  10/17/2005 / DT:  10/19/2005   CC:    [name], M.D.

## 2010-10-10 ENCOUNTER — Ambulatory Visit (INDEPENDENT_AMBULATORY_CARE_PROVIDER_SITE_OTHER): Payer: BC Managed Care – PPO | Admitting: Internal Medicine

## 2010-10-10 ENCOUNTER — Telehealth: Payer: Self-pay | Admitting: *Deleted

## 2010-10-10 ENCOUNTER — Encounter: Payer: Self-pay | Admitting: Internal Medicine

## 2010-10-10 VITALS — BP 110/72 | HR 76 | Ht 63.0 in | Wt 173.0 lb

## 2010-10-10 DIAGNOSIS — K602 Anal fissure, unspecified: Secondary | ICD-10-CM

## 2010-10-10 DIAGNOSIS — K2270 Barrett's esophagus without dysplasia: Secondary | ICD-10-CM

## 2010-10-10 NOTE — Progress Notes (Signed)
Catherine [name] Jul 26, 1950 MRN [id number]    History of Present Illness:  This is a 60 year old [place] female with a history of Barrett's esophagus. Her last upper endoscopy was in January 2010. She needs a refill on her Nexium. She also has a history of an anal fissure which is currently asymptomatic. Her last colonoscopy in June 2008 showed a small anal fissure. She is due for an upper endoscopy.   Past Medical History  Diagnosis Date  . Diabetes mellitus   . Diverticulosis   . IBS (irritable bowel syndrome)   . Anal fissure   . Barrett esophagus   . Hyperlipidemia   . Asthma   . Anxiety   . GERD (gastroesophageal reflux disease)   . Hypothyroidism    Past Surgical History  Procedure Date  . Tubal ligation     reports that she has never smoked. She has never used smokeless tobacco. She reports that she does not drink alcohol or use illicit drugs. family history includes Asthma in her son; Diabetes in her sister; and Lymphoma in her brother.  There is no history of Colon cancer. Allergies  Allergen Reactions  . Erythromycin   . Penicillins         Review of Systems: Denies dysphagia, odynophagia, shortness of breath or chest pain  The remainder of the 10 point ROS is negative except as outlined in H&P   Physical Exam: General appearance  Well developed, in no distress. Eyes- non icteric. HEENT nontraumatic, normocephalic. Mouth no lesions, tongue papillated, no cheilosis. Neck supple without adenopathy, thyroid not enlarged, no carotid bruits, no JVD. Lungs Clear to auscultation bilaterally. Cor normal S1, normal S2, regular rhythm, no murmur,  quiet precordium. Abdomen: Soft nontender with normoactive bowel sounds. No distention. Liver edge at costal margin.  Rectal and anoscopic exam reveals a skin tag at 3:00 with a normal anal canal. Scar tissue within the anal canal from prior anal fissure. No active fissure seen. Rectal ampulla normal, stool is Hemoccult  negative. Extremities no pedal edema. Skin no lesions. Neurological alert and oriented x 3. Psychological normal mood and affect.  Assessment and Plan:  Problem #1 Barrett's esophagus. We will refill Nexium 40 mg by mouth twice a day. We will give her samples of Nexium for treatment of GERD also will give samples of probiotics. We will schedule an upper endoscopy at this time.  Problem #2 anal fissure. This has currently healed. She is to continue a high fiber diet and topical Vaseline as needed.    10/10/2010 Catherine [name]

## 2010-10-10 NOTE — Patient Instructions (Signed)
We have given you samples of Nexium to take. We have given you samples of Align. This puts good bacteria back into your intestines. You should take 1 capsule by mouth once daily. If this works well for you, it can be purchased over the counter. CC:Dr Mila Palmer

## 2010-10-10 NOTE — Telephone Encounter (Signed)
Originally left message for patient to call back as Dr Juanda Chance wanted her to have an endoscopy for follow up on Barrett's. Patient called back and spoke to [facility] about endoscopy. When told that she needed to schedule the procedure, she declined at this time stating that she had to get permission from her husband first. Dr Juanda Chance has been notified of the above conversation.

## 2010-10-11 NOTE — Telephone Encounter (Signed)
Patient spoke with [facility], Thomasenia Sales.

## 2010-10-27 ENCOUNTER — Other Ambulatory Visit: Payer: Self-pay | Admitting: Internal Medicine

## 2010-10-27 MED ORDER — ESOMEPRAZOLE MAGNESIUM 40 MG PO CPDR: 40.0000 mg | DELAYED_RELEASE_CAPSULE | Freq: Two times a day (BID) | ORAL | Status: DC

## 2010-10-27 NOTE — Telephone Encounter (Signed)
rx sent

## 2010-10-30 ENCOUNTER — Telehealth: Payer: Self-pay | Admitting: *Deleted

## 2010-10-30 NOTE — Telephone Encounter (Signed)
Message copied by Richardson Chiquito on Mon Oct 30, 2010  8:15 AM ------      Message from: Hart Carwin      Created: Fri Oct 27, 2010 11:35 PM      Regarding: colonoscopy       According to the guidelines she will not need colon till 2018,      ----- Message -----         From: Vernia Buff, CMA         Sent: 10/27/2010  12:06 PM           To: Hart Carwin, MD            Dr Juanda Chance-      Looks like patient called back and scheduled endoscopy AND colonoscopy....does she need colon too or was she just due for endo (from your office note, she only needed an endo)

## 2010-10-30 NOTE — Telephone Encounter (Signed)
Changed to endoscopy only.

## 2010-10-31 ENCOUNTER — Telehealth: Payer: Self-pay | Admitting: Internal Medicine

## 2010-10-31 NOTE — Telephone Encounter (Signed)
Pt states she was supposed to have a refill of her Nexium sent to the pharmacy for a year. Rx was only refilled X 1 because pt is to keep her endo appt first and then further refills may be called in at that point. Pt aware.

## 2010-11-13 ENCOUNTER — Ambulatory Visit (AMBULATORY_SURGERY_CENTER): Payer: BC Managed Care – PPO | Admitting: *Deleted

## 2010-11-13 VITALS — Ht 63.0 in | Wt 170.2 lb

## 2010-11-13 DIAGNOSIS — K2270 Barrett's esophagus without dysplasia: Secondary | ICD-10-CM

## 2010-11-16 ENCOUNTER — Ambulatory Visit (AMBULATORY_SURGERY_CENTER): Payer: BC Managed Care – PPO | Admitting: Internal Medicine

## 2010-11-16 ENCOUNTER — Encounter: Payer: Self-pay | Admitting: Internal Medicine

## 2010-11-16 ENCOUNTER — Other Ambulatory Visit: Payer: BC Managed Care – PPO | Admitting: Internal Medicine

## 2010-11-16 VITALS — BP 127/70 | HR 70 | Temp 96.7°F | Resp 18 | Ht 63.0 in | Wt 170.0 lb

## 2010-11-16 DIAGNOSIS — K2270 Barrett's esophagus without dysplasia: Secondary | ICD-10-CM

## 2010-11-16 DIAGNOSIS — K2940 Chronic atrophic gastritis without bleeding: Secondary | ICD-10-CM

## 2010-11-16 DIAGNOSIS — K317 Polyp of stomach and duodenum: Secondary | ICD-10-CM

## 2010-11-16 DIAGNOSIS — D131 Benign neoplasm of stomach: Secondary | ICD-10-CM

## 2010-11-16 DIAGNOSIS — K219 Gastro-esophageal reflux disease without esophagitis: Secondary | ICD-10-CM

## 2010-11-16 LAB — GLUCOSE, CAPILLARY
Glucose-Capillary: 111 mg/dL — ABNORMAL HIGH (ref 70–99)
Glucose-Capillary: 109 mg/dL — ABNORMAL HIGH (ref 70–99)

## 2010-11-16 MED ORDER — ESOMEPRAZOLE MAGNESIUM 40 MG PO CPDR: 40.0000 mg | DELAYED_RELEASE_CAPSULE | Freq: Two times a day (BID) | ORAL | Status: DC

## 2010-11-16 MED ORDER — SODIUM CHLORIDE 0.9 % IV SOLN: 500.0000 mL | INTRAVENOUS | Status: DC

## 2010-11-16 NOTE — Patient Instructions (Signed)
Please refer to your blue and neon green sheets for instructions regarding diet and activity for the rest of today.  You may resume your medications as you would normally take them.   You will receive a letter in the mail in about two weeks regarding the results of the biopsies taken today.  Barrett's Esophagus The esophagus is the muscular tube that carries food and saliva from the mouth to the stomach. Barrett's esophagus involves changes in the esophagus. Some of its lining is replaced by a type of tissue similar to that found in the intestine. This process is called intestinal metaplasia. While Barrett's esophagus may cause no symptoms itself, a small number of people with this condition develop a relatively rare but often deadly type of cancer of the esophagus. It is called esophageal adenocarcinoma. Barrett's esophagus is associated with the common condition called GERD (gastroesophageal reflux disease). HOW THE ESOPHAGUS WORKS The esophagus carries food, liquids, and saliva from the mouth to the stomach. The stomach acts as a container to start digestion and pump food and liquids into the intestines in a controlled process. Food can then be properly digested over time. Nutrients can be taken in (absorbed) by the intestines. The esophagus moves food to the stomach by coordinated contractions of its muscular lining. This process is automatic. People are usually not aware of it. Many people have felt their esophagus when they:  Swallow something too large.   Try to eat too quickly.   Drink very hot or cold liquids.  They then feel the movement of the food or drink down the esophagus into the stomach. This may be an uncomfortable feeling. DIGESTIVE TRACT The muscular layers of the esophagus are normally pinched together at both the upper and lower ends by muscles. These muscles are called sphincters. When a person swallows, the sphincters relax automatically. This allows food or drink to pass  from the mouth, into the stomach. The muscles then close rapidly. This prevents the swallowed food or drink from leaking out of the stomach, back into the esophagus or into the mouth. These muscles make it possible to swallow while lying down or even upside-down. When people belch to release swallowed air or gas from carbonated beverages, the sphincters relax. Then small amounts of food or drink may come back up, briefly. This condition is called reflux. The esophagus quickly squeezes the material back into the stomach. This is considered normal. These functions of the esophagus are an important part of everyday life. However, people who must have their esophagus removed, for example because of cancer, can live a relatively healthy life without it. GERD (GASTROESOPHAGEAL REFLUX DISEASE) Having some stomach contents (liquids or gas) sometimes reflux (come back up from the stomach into the esophagus) is considered normal. When it happens often, and causes other symptoms, it is considered a medical problem or disease. However, it is not necessarily a serious one or one that requires seeing a caregiver. The stomach produces acid and enzymes to digest food. When this mixture refluxes into the esophagus more often than normal or for a longer period of time than normal, it may produce symptoms. These symptoms are often called acid reflux. They are often described by people as heartburn, indigestion, or "gas". The symptoms often consist of a burning sensation below and behind the lower part of the breastbone or sternum. Almost everyone has experienced these symptoms at least once. This is typically a result of overeating. Other things that provoke GERD symptoms include:  Being  overweight.   Eating certain types of foods.   Being pregnant.  In most people, GERD symptoms may last only a short time and require no treatment at all.  More continual symptoms are often quickly relieved by over-the-counter  acid-reducing agents, such as antacids. Other drugs used to relieve GERD symptoms are antisecretory drugs, such as histamine2 (H2) blockers or proton pump inhibitors. People who have symptoms often should talk with their caregiver. Other diseases can have similar symptoms. Prescription medicines, together with other actions, might be needed to reduce reflux. GERD that is untreated over a long period can lead to problems. An example is an ulcer in the esophagus, that could cause bleeding. Another common problem is scar tissue that blocks the movement of swallowed food and drink through the esophagus. This condition is called stricture. Esophageal reflux may also cause certain less common symptoms. These include hoarseness or chronic cough. It sometimes provokes conditions such as asthma. While most patients find that lifestyle changes and acid-blocking drugs relieve their symptoms, caregivers sometimes advise surgery. Overall, GERD is one of the most common medical conditions. About 20 percent of the population can be affected over a lifetime. GERD AND BARRETT'S ESOPHAGUS The exact causes of Barrett's esophagus are not known. It is thought to be caused in part by the same factors that cause GERD. People who do not have heartburn can have Barrett's esophagus. However, it is found about 3 to 5 times more often in people with this condition. Barrett's esophagus is uncommon in children. The average age at diagnosis is 62. But it is usually difficult to know when the problem started. It is about twice as common in men as in women. It is much more common in white men than in men of other races. BARRETT'S ESOPHAGUS AND CANCER OF THE ESOPHAGUS Barrett's esophagus does not cause symptoms itself. However, it seems to precede the development of a particular kind of cancer. This cancer is esophageal adenocarcinoma. The risk of developing this cancer is 30 to 125 times higher in people who have Barrett's esophagus than in  people who do not. This type of cancer is increasing quickly in white men. The increase is possibly related to the rise in obesity and GERD. For people who have Barrett's esophagus, the risk of getting cancer of the esophagus is small. It is less than 1 percent (0.4% to 0.5%) per year. Esophageal adenocarcinoma is often not curable. This is partly because the disease is often discovered at a late stage and treatments are not effective. DIAGNOSIS Diagnosing Barrett's esophagus is not easy. At the present time it cannot be diagnosed based on symptoms, physical exam, or blood tests. The only useful test is upper gastrointestinal endoscopy and biopsy. In this procedure, a flexible tube called an endoscope is used. This tool is like a pencil sized flexible telescope. It has a light and tiny camera. It is passed into the esophagus. If the tissue appears suspicious to your caregiver, biopsies must be done. A biopsy is the removal of a small piece of tissue. This is done using a pincher-like device passed through the endoscope. A pathologist is a specialist who examines body tissue samples. He or she examines the tissue under a microscope to confirm the diagnosis. Looking for a medical problem in people who do not know whether they have one is called screening. Currently, there are no commonly accepted guidelines on who should have endoscopy, to check for Barrett's esophagus. There are many reasons for the lack of  firm recommendations about screening. Among them are the great cost and occasional risk of side effects of the test. Also, the rate of finding Barrett's esophagus is low. Finding the problem early has not been proven to prevent deaths from cancer. Many caregivers advise that adult patients who are over the age of 64 and have had GERD symptoms for a number of years have endoscopy, to see whether they have Barrett's esophagus. Screening for this condition in people who have no symptoms is not advised. TREATMENT   Barrett's esophagus has no cure, other than surgical removal of the esophagus. This is a serious operation. Surgery is advised only for people who have a high risk of developing cancer or who already have it. Most caregivers recommend treating GERD with acid-blocking drugs. This is sometimes linked to improvement in the extent of the Barrett's tissue. But this approach has not been proven to reduce the risk of cancer. Treating reflux with surgery for GERD also does not seem to cure Barrett's esophagus. Several experimental approaches are under study. One attempts to see whether destroying the Barrett's tissue by heat or other means, through an endoscope, can get rid of the condition. But this approach has potential risks and unknown effectiveness. LOOKING FOR DYSPLASIA AND CANCER Occasional endoscopic examinations to look for early warning signs of cancer are generally advised for people who have Barrett's esophagus. This approach is called surveillance. When people who have Barrett's esophagus develop cancer, the process seems to go through an intermediate stage. In this stage cancer cells appear in the Barrett's tissue. This condition is called dysplasia. It can be seen only in biopsies with a microscope. The process is patchy and cannot be seen directly through the endoscope. So, multiple biopsies must be taken. Even then, it can be missed. The process of change from Barrett's to cancer seems to happen only in a few patients. It is less than 1 percent per year. And it happens over a relatively long period of time. Most caregivers advise that patients with Barrett's esophagus undergo occasional endoscopy to have biopsies. The recommended time between endoscopies varies depending on circumstances. The best time interval has not been decided. TREATMENT FOR DYSPLASIA OR ESOPHAGEAL ADENOCARCINOMA If a person with Barrett's esophagus is found to have dysplasia or cancer, the caregiver will usually recommend  surgery. This is if the person is strong enough and has a good chance of being cured. The type of surgery may vary. But it usually involves removing most of the esophagus and pulling the stomach up into the chest to attach it to what remains of the esophagus. Many patients with Barrett's esophagus are elderly. They may have many other medical problems that make surgery unwise. In these patients, other methods to treat dysplasia are being studied. IMPORTANT POINTS TO REMEMBER  In Barrett's esophagus, the cells lining the esophagus change. They become similar to the cells lining the intestine.   Barrett's esophagus is connected with gastroesophageal reflux disease or GERD.   A small number of people with Barrett's esophagus may develop esophageal cancer.   Barrett's esophagus is diagnosed by upper gastrointestinal endoscopy and biopsy.   People who have Barrett's esophagus should have periodic esophageal exams.   Taking acid-blocking drugs for GERD may help improve Barrett's esophagus.   Removal of the esophagus is recommended only for people who have a high risk of developing cancer or who already have it.  FOR MORE INFORMATION International Foundation for Functional Gastrointestinal Disorders (IFFGD): www.iffgd.org Document Released: 03/17/2003 Document  Revised: 09/06/2010 Document Reviewed: 12/22/2007 [facility] Patient Information 2012 [place], [place].

## 2010-11-17 ENCOUNTER — Telehealth: Payer: Self-pay | Admitting: *Deleted

## 2010-11-17 NOTE — Telephone Encounter (Signed)

## 2010-11-21 ENCOUNTER — Encounter: Payer: Self-pay | Admitting: Internal Medicine

## 2011-01-10 ENCOUNTER — Other Ambulatory Visit: Payer: Self-pay | Admitting: Internal Medicine

## 2011-01-10 MED ORDER — ESOMEPRAZOLE MAGNESIUM 40 MG PO CPDR: 40.0000 mg | DELAYED_RELEASE_CAPSULE | Freq: Two times a day (BID) | ORAL | Status: DC

## 2011-01-10 NOTE — Telephone Encounter (Signed)
Spoke to patient and told her that rx has been sent.

## 2011-04-26 ENCOUNTER — Other Ambulatory Visit: Payer: Self-pay | Admitting: Internal Medicine

## 2011-04-26 NOTE — Telephone Encounter (Signed)
Spoke to pharmacist. Patient was given #30 Nexium with multiple refills recently. The 3 month rx sent last time was because patient was going out of town. Pharmacy states that they already filled rx for 30 earlier today and do not need another script.

## 2011-08-29 ENCOUNTER — Encounter: Payer: Self-pay | Admitting: *Deleted

## 2011-09-19 ENCOUNTER — Encounter: Payer: Self-pay | Admitting: Internal Medicine

## 2011-09-19 ENCOUNTER — Ambulatory Visit (INDEPENDENT_AMBULATORY_CARE_PROVIDER_SITE_OTHER): Payer: BC Managed Care – PPO | Admitting: Internal Medicine

## 2011-09-19 VITALS — BP 96/60 | HR 88 | Ht 62.25 in | Wt 160.4 lb

## 2011-09-19 DIAGNOSIS — K602 Anal fissure, unspecified: Secondary | ICD-10-CM

## 2011-09-19 DIAGNOSIS — K2270 Barrett's esophagus without dysplasia: Secondary | ICD-10-CM

## 2011-09-19 MED ORDER — HYDROCORTISONE 1 % EX CREA: TOPICAL_CREAM | CUTANEOUS | Status: DC

## 2011-09-19 MED ORDER — ESOMEPRAZOLE MAGNESIUM 40 MG PO CPDR: 40.0000 mg | DELAYED_RELEASE_CAPSULE | Freq: Two times a day (BID) | ORAL | Status: DC

## 2011-09-19 NOTE — Patient Instructions (Addendum)
We have sent the following medications to your pharmacy for you to pick up at your convenience: Nexium Hydrocortisone 1% cream CC: Dr Mila Palmer

## 2011-09-19 NOTE — Progress Notes (Signed)
YSIDRA SOPHER December 05, 1950 MRN [id number]    History of Present Illness:  This is a 61 year old [place] female who is followed for gastroesophageal reflux and Barrett's esophagus as well as for anal fissure. Her last upper endoscopy in October 2012 did not show Barrett's esophagus. She has been under good control with Nexium 40 mg twice a day. Prior endoscopies in December 2008 and January 2010 showed intestinal metaplasia. This is a one year followup appointment. She needs refills on her medications. Her anal fissure has not been active. She has occasional rectal irritation when she gets constipated. Her last colonoscopy in June 2008 showed scar tissue in the anal canal but no active fissure. She had mild diverticulosis.   Past Medical History  Diagnosis Date  . Diabetes mellitus   . Diverticulosis   . IBS (irritable bowel syndrome)   . Anal fissure   . Barrett esophagus   . Hyperlipidemia   . Asthma   . Anxiety   . GERD (gastroesophageal reflux disease)   . Hypothyroidism   . Hyperplastic polyps of stomach   . Gastritis    Past Surgical History  Procedure Date  . Tubal ligation     reports that she has never smoked. She has never used smokeless tobacco. She reports that she does not drink alcohol or use illicit drugs. family history includes Asthma in her son; Diabetes in her sister; and Lymphoma in her brother.  There is no history of Colon cancer. Allergies  Allergen Reactions  . Erythromycin Diarrhea  . Penicillins Diarrhea        Review of Systems: Denies dysphagia heartburn or odynophagia abdominal pain  The remainder of the 10 point ROS is negative except as outlined in H&P   Physical Exam: General appearance  Well developed, in no distress. Eyes- non icteric. HEENT nontraumatic, normocephalic. Mouth no lesions, tongue papillated, no cheilosis. Neck supple without adenopathy, thyroid not enlarged, no carotid bruits, no JVD. Lungs Clear to auscultation  bilaterally. Cor normal S1, normal S2, regular rhythm, no murmur,  quiet precordium. Abdomen: Soft relaxed abdomen with normal active bowel sounds. No tenderness. Rectal: Skin tag at the rectovaginal wall nontender and noninflamed. Normal rectal sphincter tone. Scar tissue at 3:00 from a healed anal fissure. Stool is Hemoccult negative. Extremities no pedal edema. Skin no lesions. Neurological alert and oriented x 3. Psychological normal mood and affect.  Assessment and Plan:  Problem #1 Gastroesophageal reflux disease with complete control of her reflux on Nexium 40 mg twice a day. She has a history of Barrett's esophagus but this was not confirmed on her last endoscopy in October 2012. Her next endoscopy will be in October 2014.  Problem #2 History of anal fissure. Patient is currently asymptomatic. She will get refills for Analpram cream, generic 1% to apply when necessary. A recall colonoscopy will be due in June 2018.   09/19/2011 Catherine [name]

## 2011-11-19 ENCOUNTER — Other Ambulatory Visit: Payer: Self-pay | Admitting: Sports Medicine

## 2011-11-19 DIAGNOSIS — M79605 Pain in left leg: Secondary | ICD-10-CM

## 2011-11-21 ENCOUNTER — Ambulatory Visit
Admission: RE | Admit: 2011-11-21 | Discharge: 2011-11-21 | Disposition: A | Discharge status: Home / Self Care | Payer: BC Managed Care – PPO | Source: Ambulatory Visit | Attending: Sports Medicine | Admitting: Sports Medicine

## 2011-11-21 DIAGNOSIS — M79605 Pain in left leg: Secondary | ICD-10-CM

## 2011-11-26 ENCOUNTER — Encounter: Payer: Self-pay | Admitting: Gynecology

## 2011-11-26 DIAGNOSIS — K219 Gastro-esophageal reflux disease without esophagitis: Secondary | ICD-10-CM | POA: Insufficient documentation

## 2011-11-26 DIAGNOSIS — J45909 Unspecified asthma, uncomplicated: Secondary | ICD-10-CM | POA: Insufficient documentation

## 2011-11-26 DIAGNOSIS — K5790 Diverticulosis of intestine, part unspecified, without perforation or abscess without bleeding: Secondary | ICD-10-CM | POA: Insufficient documentation

## 2011-11-26 DIAGNOSIS — E785 Hyperlipidemia, unspecified: Secondary | ICD-10-CM | POA: Insufficient documentation

## 2011-11-26 DIAGNOSIS — K317 Polyp of stomach and duodenum: Secondary | ICD-10-CM | POA: Insufficient documentation

## 2011-11-26 DIAGNOSIS — E119 Type 2 diabetes mellitus without complications: Secondary | ICD-10-CM | POA: Insufficient documentation

## 2011-11-26 DIAGNOSIS — K2970 Gastritis, unspecified, without bleeding: Secondary | ICD-10-CM | POA: Insufficient documentation

## 2011-11-26 DIAGNOSIS — M8580 Other specified disorders of bone density and structure, unspecified site: Secondary | ICD-10-CM | POA: Insufficient documentation

## 2011-11-26 DIAGNOSIS — E039 Hypothyroidism, unspecified: Secondary | ICD-10-CM | POA: Insufficient documentation

## 2011-11-26 DIAGNOSIS — F419 Anxiety disorder, unspecified: Secondary | ICD-10-CM | POA: Insufficient documentation

## 2011-11-26 DIAGNOSIS — K2270 Barrett's esophagus without dysplasia: Secondary | ICD-10-CM | POA: Insufficient documentation

## 2011-11-26 DIAGNOSIS — K589 Irritable bowel syndrome without diarrhea: Secondary | ICD-10-CM | POA: Insufficient documentation

## 2011-11-26 DIAGNOSIS — K602 Anal fissure, unspecified: Secondary | ICD-10-CM | POA: Insufficient documentation

## 2011-12-04 ENCOUNTER — Encounter: Payer: BC Managed Care – PPO | Admitting: Obstetrics and Gynecology

## 2011-12-28 ENCOUNTER — Encounter: Payer: BC Managed Care – PPO | Admitting: Obstetrics and Gynecology

## 2012-01-03 ENCOUNTER — Encounter: Payer: Self-pay | Admitting: Obstetrics and Gynecology

## 2012-01-03 ENCOUNTER — Other Ambulatory Visit (HOSPITAL_COMMUNITY)
Admission: RE | Admit: 2012-01-03 | Discharge: 2012-01-03 | Disposition: A | Discharge status: Home / Self Care | Payer: BC Managed Care – PPO | Source: Ambulatory Visit | Attending: Obstetrics and Gynecology | Admitting: Obstetrics and Gynecology

## 2012-01-03 ENCOUNTER — Ambulatory Visit (INDEPENDENT_AMBULATORY_CARE_PROVIDER_SITE_OTHER): Payer: BC Managed Care – PPO | Admitting: Obstetrics and Gynecology

## 2012-01-03 VITALS — BP 130/80 | Ht 63.0 in | Wt 158.0 lb

## 2012-01-03 DIAGNOSIS — Z01419 Encounter for gynecological examination (general) (routine) without abnormal findings: Secondary | ICD-10-CM

## 2012-01-03 NOTE — Progress Notes (Signed)
Patient came to see me today for her annual GYN exam. She is menopausal and is doing well without HRT. She is having no vaginal bleeding. She is having no pelvic pain. She has always had normal Pap smears. Her last Pap smear was 2011. She had a mammogram in 2011. Her last bone density was in 2008. Her worst  T score was -1.7. She had additional bone loss from previous bone density. She did not have an elevated fracture risk. She has not returned  for bone density since then. Her surgical history includes an ectopic pregnancy and a tubal ligation. She was having trouble with vulvitis. It did not respond to either any fungal medicine or steroid cream. Punch biopsy reveal plasma cell vulvitis. Dermatological consult was that she should stop all  medication. It is now resolved. She is not having vaginal dryness clinically.  Physical examination:Catherine [name] present. HEENT within normal limits. Neck: Thyroid not large. No masses. Supraclavicular nodes: not enlarged. Breasts: Examined in both sitting and lying  position. No skin changes and no masses. Abdomen: Soft no guarding rebound or masses or hernia. Pelvic: External: Within normal limits. BUS: Within normal limits. Vaginal:within normal limits. Poor estrogen effect. No evidence of cystocele rectocele or enterocele. Cervix: clean. Uterus: Normal size and shape. Adnexa: No masses. Rectovaginal exam: Confirmatory and negative. Extremities: Within normal limits.  Assessment: #1. Osteopenia #2. Atrophic vaginitis  Plan: Mammogram and  bone density. Pap done. The new Pap smear guidelines were discussed with the patient.

## 2012-01-03 NOTE — Patient Instructions (Signed)
Schedule mammogram and bone density.

## 2012-01-04 LAB — URINALYSIS W MICROSCOPIC + REFLEX CULTURE
Bacteria, UA: NONE SEEN
Bilirubin Urine: NEGATIVE
Casts: NONE SEEN
Crystals: NONE SEEN
Glucose, UA: NEGATIVE mg/dL
Hgb urine dipstick: NEGATIVE
Ketones, ur: NEGATIVE mg/dL
Leukocytes, UA: NEGATIVE
Nitrite: NEGATIVE
Protein, ur: NEGATIVE mg/dL
Specific Gravity, Urine: 1.005 (ref 1.005–1.030)
Squamous Epithelial / LPF: NONE SEEN
Urobilinogen, UA: 0.2 mg/dL (ref 0.0–1.0)
pH: 6.5 (ref 5.0–8.0)

## 2012-02-25 ENCOUNTER — Telehealth: Payer: Self-pay | Admitting: Internal Medicine

## 2012-02-25 NOTE — Telephone Encounter (Signed)
Per patient's pharmacy, this has already been done. Patient advised.

## 2012-02-27 ENCOUNTER — Telehealth: Payer: Self-pay | Admitting: Internal Medicine

## 2012-02-27 NOTE — Telephone Encounter (Signed)
Spoke with patient and she went to the [facility] with her PCP for abdominal pain. She was given Cipro and Flagyl for 10 days. She feels better but she is going out of the country on 03/16/12. She would like to see Dr. Juanda Chance prior to leaving to be sure everything is okay. Scheduled patient on 03/11/12 at 2:30 PM.

## 2012-03-11 ENCOUNTER — Ambulatory Visit: Payer: BC Managed Care – PPO | Admitting: Internal Medicine

## 2012-03-13 ENCOUNTER — Telehealth: Payer: Self-pay | Admitting: Internal Medicine

## 2012-03-13 MED ORDER — CIPROFLOXACIN HCL 250 MG PO TABS: ORAL_TABLET | ORAL | Status: DC

## 2012-03-13 NOTE — Telephone Encounter (Signed)
Rx sent. Patient notified. 

## 2012-03-13 NOTE — Telephone Encounter (Signed)
Patient did not keep her OV scheduled early due to "bad weather." She is calling because she is going out of the country on Sunday. She reports having left sided abdominal pain 2-3 weeks ago. She went to [facility] walk in clinic and was given Cipro and Flagyl x 10 days for "infection." She states she will have [facility] to fax [place] these records today for Dr. Juanda Chance to review. She is asking if Dr. Juanda Chance thinks she should take antibiotics with her on her trip and if so will Dr.Brodie give her the rx. Please, advise.

## 2012-03-13 NOTE — Telephone Encounter (Signed)
As per last colonoscopy in 2008, pt had mild left colon diverticulosis. It is unlikely that she would have a diverticulitis but since she is going to [place] for 2 months, please call her Cipro 250 mg, #20, 1 po bid in case she has an attack while she is there.

## 2012-05-01 ENCOUNTER — Telehealth: Payer: Self-pay | Admitting: Internal Medicine

## 2012-05-01 NOTE — Telephone Encounter (Signed)
OV note from Sigmund Hazel, MD on 01/19/12 is in EPIC. Patient notified

## 2012-05-01 NOTE — Telephone Encounter (Signed)
Patient states she is back from her visit to [place] and she has had 2 rounds of Cipro. She is still having abdominal pain once in awhile and would like to be seen. Explained that Dr. Juanda Chance does not have any appointments until the end of May. Patient scheduled with Doug Sou, PA on 05/02/12 at 10:45 AM.

## 2012-05-02 ENCOUNTER — Encounter: Payer: Self-pay | Admitting: Gastroenterology

## 2012-05-02 ENCOUNTER — Other Ambulatory Visit (INDEPENDENT_AMBULATORY_CARE_PROVIDER_SITE_OTHER): Payer: BC Managed Care – PPO

## 2012-05-02 ENCOUNTER — Ambulatory Visit (INDEPENDENT_AMBULATORY_CARE_PROVIDER_SITE_OTHER): Payer: BC Managed Care – PPO | Admitting: Gastroenterology

## 2012-05-02 ENCOUNTER — Ambulatory Visit (INDEPENDENT_AMBULATORY_CARE_PROVIDER_SITE_OTHER)
Admission: RE | Admit: 2012-05-02 | Discharge: 2012-05-02 | Disposition: A | Discharge status: Home / Self Care | Payer: BC Managed Care – PPO | Source: Ambulatory Visit | Attending: Gastroenterology | Admitting: Gastroenterology

## 2012-05-02 VITALS — BP 112/78 | HR 94 | Ht 63.0 in | Wt 153.1 lb

## 2012-05-02 DIAGNOSIS — R1032 Left lower quadrant pain: Secondary | ICD-10-CM

## 2012-05-02 DIAGNOSIS — R509 Fever, unspecified: Secondary | ICD-10-CM

## 2012-05-02 LAB — BASIC METABOLIC PANEL
BUN: 14 mg/dL (ref 6–23)
CO2: 27 mEq/L (ref 19–32)
Calcium: 9.5 mg/dL (ref 8.4–10.5)
Chloride: 102 mEq/L (ref 96–112)
Creatinine, Ser: 0.7 mg/dL (ref 0.4–1.2)
GFR: 89.67 mL/min (ref >60.00)
Glucose, Bld: 120 mg/dL — ABNORMAL HIGH (ref 70–99)
Potassium: 4.2 mEq/L (ref 3.5–5.1)
Sodium: 136 mEq/L (ref 135–145)

## 2012-05-02 MED ORDER — IOHEXOL 300 MG/ML  SOLN
100.0000 mL | Freq: Once | INTRAMUSCULAR | Status: AC | PRN
  Administered 2012-05-02: 100 mL via INTRAVENOUS

## 2012-05-02 NOTE — Progress Notes (Signed)
05/02/2012 Catherine [name] [id number] 11/05/1950   History of Present Illness: Patient is a pleasant 62 year old female who is a patient of Dr. Regino Schultze.  She presents to our office today with complaints of LLQ abdominal pain.  Says that the pain began in January and she was treated with 10 days of cipro and flagyl.  Had fever at that time and elevated WBC count was slightly elevated at 12000.  She started feeling better but once she stopped the antibiotics the pain seemed to return.  She called our office asking for more anitbiotics because she was leaving for [place].  She completed the cipro that she was given, but the pain recurred again while in [place].  She saw a physician while she was there and they recommended an imagining study, but she wanted to wait until she returned here to have it done.  She was also given cipro and flagyl while she was there, which she finished as well.  Was having diarrhea while in [place], but BM's are back to normal since she returned to the [place].  Now she continues to have pain intermittently in the LLQ that is sharp at times.  Current Medications, Allergies, Past Medical History, Past Surgical History, Family History and Social History were reviewed in [employer] record.   Physical Exam: BP 112/78  Pulse 94  Ht 5\' 3"  (1.6 m)  Wt 153 lb 1.6 oz (69.446 kg)  BMI 27.13 kg/m2  SpO2 98% General: Well developed female in no acute distress Head: Normocephalic and atraumatic Eyes:  sclerae anicteric, conjunctiva pink  Ears: Normal auditory acuity Lungs: Clear throughout to auscultation Heart: Regular rate and rhythm Abdomen: Soft, non-distended. No masses, no hepatomegaly. Normal bowel sounds.  LLQ TTP without R/R/G. Musculoskeletal: Symmetrical with no gross deformities  Extremities: No edema  Neurological: Alert oriented x 4, grossly nonfocal Psychological:  Alert and cooperative. Normal mood and affect  Assessment and  Recommendations: -LLQ abdominal pain with fever:  Has already completed several courses of antibiotics and pain is definitely much improved, but still sharp at times.  I agree that she may have suffered from diverticulitis, but am not convinced that she still has the condition.  Will rule out other causes of pain and evaluated for resolving disease.  In the interim she will try levsin prn since her pain may just be residual discomfort from irritation/spasm of the diverticula.

## 2012-05-02 NOTE — Progress Notes (Signed)
Reviewed, she had mild diverticulosis on colonoscopy 06/2006, CT scan is appropriate, Cipro/Flagyl, may need to modify diet to full liquids for several days. Levsin SL. May consider colonoscopy to r/o segmental colitis pending CT scan.

## 2012-05-02 NOTE — Patient Instructions (Addendum)
You have been scheduled for a CT scan of the abdomen and pelvis at [facility] ([address]---this is in the same building as [facility]).   You are scheduled on TODAY at 2:30PM. You should arrive 15 minutes prior to your appointment time for registration. Please follow the written instructions below on the day of your exam:  WARNING: IF YOU ARE ALLERGIC TO IODINE/X-RAY DYE, PLEASE NOTIFY RADIOLOGY IMMEDIATELY AT [phone]! YOU WILL BE GIVEN A 13 HOUR PREMEDICATION PREP.  1) Do not eat or drink anything after 10:30AM (4 hours prior to your test) 2) You have been given 2 bottles of oral contrast to drink. The solution may taste               better if refrigerated, but do NOT add ice or any other liquid to this solution. Shake             well before drinking.    Drink 1 bottle of contrast @ 12:30PM (2 hours prior to your exam)  Drink 1 bottle of contrast @ 1:30PM (1 hour prior to your exam)  You may take any medications as prescribed with a small amount of water except for the following: Metformin, Glucophage, Glucovance, Avandamet, Riomet, Fortamet, Actoplus Met, Janumet, Glumetza or Metaglip. The above medications must be held the day of the exam AND 48 hours after the exam.  The purpose of you drinking the oral contrast is to aid in the visualization of your intestinal tract. The contrast solution may cause some diarrhea. Before your exam is started, you will be given a small amount of fluid to drink. Depending on your individual set of symptoms, you may also receive an intravenous injection of x-ray contrast/dye. Plan on being at [facility] for 30 minutes or long, depending on the type of exam you are having performed.  This test typically takes 30-45 minutes to complete.  If you have any questions regarding your exam or if you need to reschedule, you may call the CT department at [phone] between the hours of 8:00 am and 5:00 pm,  Monday-Friday.  ________________________________________________________________________ Go to the basement for labs today

## 2012-05-05 ENCOUNTER — Ambulatory Visit (INDEPENDENT_AMBULATORY_CARE_PROVIDER_SITE_OTHER): Payer: BC Managed Care – PPO | Admitting: Obstetrics & Gynecology

## 2012-05-05 ENCOUNTER — Telehealth: Payer: Self-pay | Admitting: *Deleted

## 2012-05-05 ENCOUNTER — Encounter: Payer: Self-pay | Admitting: Obstetrics & Gynecology

## 2012-05-05 VITALS — BP 116/76 | Ht 62.75 in | Wt 152.8 lb

## 2012-05-05 DIAGNOSIS — D271 Benign neoplasm of left ovary: Secondary | ICD-10-CM

## 2012-05-05 DIAGNOSIS — R1032 Left lower quadrant pain: Secondary | ICD-10-CM

## 2012-05-05 DIAGNOSIS — D279 Benign neoplasm of unspecified ovary: Secondary | ICD-10-CM | POA: Insufficient documentation

## 2012-05-05 MED ORDER — CLOBETASOL PROPIONATE 0.05 % EX OINT: TOPICAL_OINTMENT | Freq: Two times a day (BID) | CUTANEOUS | Status: DC

## 2012-05-05 NOTE — Patient Instructions (Signed)
[name], our surgery scheduler, will call you with the information about your surgery.

## 2012-05-05 NOTE — Telephone Encounter (Signed)
Pt called also and said that she would like to see a female MD, and she will call back and make OV if she does not find a female MD in 3 days. I told pt that results have been forwarded to you already to review.

## 2012-05-05 NOTE — Telephone Encounter (Signed)
Pt informed with the below note. 

## 2012-05-05 NOTE — Telephone Encounter (Signed)
Rene Kocher called from Dr.Brodie office called to inform GYN MD of  CT of abdomen and pelvis result on 05/02/12.  Please advise

## 2012-05-05 NOTE — Progress Notes (Signed)
62 y.o. [id number] Married [place] F here for new patient visit.  Patient reports recently traveling to [place] several weeks ago.  While there, she had severe diarrhea that was treated with Ciprofloxin.  This was associated with LLQ pain.  No nausea or emesis.  She did not think she ever had a fever.  Upon returning to the [place] was still having pain although the diarrhea resolved.  She went to see Dr. Juanda Chance, her GI.  She was treated with two more rounds of antibiotics and the pain continued.  It is a constant ache that never really goes away and doesn't seem to get worse.  The pain is maybe  3 or 4 out of 10.  Nothing OTC seems to help.  She had a CT scan last week showing a 4cm dermoid on the left side and was recommended to see her gynecologist.  Dr. Eda Paschal was her gyn and he retired at the end of the year.  She is here as a new patient.    Patient's last menstrual period was 01/09/2007.          Sexually active: yes  The current method of family planning is post menopausal status.    Exercising: yes  walking Smoker:  no  Health Maintenance: Pap:  12/2011 MMG:  2012 Colonoscopy:  2008 with Dr Juanda Chance BMD:   2008 lowest T score -1.7 in spine TDaP:  approx 10 year ago per pt Labs:  With PCP.  Labs done 05/02/12 for pain.  Dr. Mila Palmer.  Dr. Talmage Nap is endocrinologist.     reports that she has never smoked. She has never used smokeless tobacco. She reports that she does not drink alcohol or use illicit drugs.  Past Medical History  Diagnosis Date  . Diverticulosis     Dr Juanda Chance  . IBS (irritable bowel syndrome)   . Anal fissure   . Barrett esophagus     normal now  . Hyperlipidemia   . Asthma   . GERD (gastroesophageal reflux disease)   . Hyperplastic polyps of stomach   . Gastritis   . Osteopenia   . Hypothyroidism   . Anxiety   . Diabetes mellitus     Past Surgical History  Procedure Laterality Date  . Tubal ligation    . Ectopic pregnancy surgery      Current  Outpatient Prescriptions  Medication Sig Dispense Refill  . esomeprazole (NEXIUM) 40 MG capsule Take 1 capsule (40 mg total) by mouth 2 (two) times daily.  60 capsule  10  . hydrocortisone cream 1 % Apply to anal fissure as needed.  30 g  2  . levothyroxine (SYNTHROID, LEVOTHROID) 25 MCG tablet Take 1 by mouth every day but 2 tablets on Tues and Wed      . metFORMIN (GLUCOPHAGE) 500 MG tablet Take 500 mg by mouth 2 (two) times daily with a meal.       . Multiple Vitamins-Minerals (CENTRUM PO) Take 1 tablet by mouth daily.        . Probiotic Product (PROBIOTIC PO) Take by mouth as needed.        No current facility-administered medications for this visit.    Family History  Problem Relation Age of Onset  . Thyroid disease Son   . Lymphoma Brother   . Colon cancer Neg Hx   . Diabetes Sister     maternal aunt  . Hypertension Father   . Stroke Father   . Diabetes Father 42  on po meds  . Diabetes Mother 90    on insulin  . Heart disease Brother     heart stent    ROS:  Pertinent items are noted in HPI.  Otherwise, a comprehensive ROS was negative.  Exam:   BP 116/76  Ht 5' 2.75" (1.594 m)  Wt 152 lb 12.8 oz (69.31 kg)  BMI 27.28 kg/m2  LMP 01/09/2007  Height: 5' 2.75" (159.4 cm)  Ht Readings from Last 3 Encounters:  05/05/12 5' 2.75" (1.594 m)  05/02/12 5\' 3"  (1.6 m)  01/03/12 5\' 3"  (1.6 m)    General appearance: alert, cooperative and appears stated age Head: Normocephalic, without obvious abnormality, atraumatic Neck: no adenopathy, supple, symmetrical, trachea midline and thyroid normal to inspection and palpation Lungs: clear to auscultation bilaterally Heart: regular rate and rhythm Abdomen: soft, non-tender; bowel sounds normal; no masses,  no organomegaly Extremities: extremities normal, atraumatic, no cyanosis or edema Skin: Skin color, texture, turgor normal. No rashes or lesions Lymph nodes: Cervical, supraclavicular, and axillary nodes normal. No abnormal  inguinal nodes palpated Neurologic: Grossly normal   Pelvic: External genitalia:  Thick whitish tissue around vagina              Urethra:  normal appearing urethra with no masses, tenderness or lesions              Bartholins and Skenes: normal                 Vagina: normal appearing vagina with normal color and discharge, no lesions, atrophy              Cervix: no lesions              Pap taken: no Bimanual Exam:  Uterus:  normal size, contour, position, consistency, mobility, non-tender              Adnexa: no mass, fullness, tenderness     Patient and her spouse and I discussed her CT scan and recommendation for removal of the left ovarian dermoid.  Procedure, incision locations, risks including bowel/bladder/ureteral/vascular injury, need for future surgery, DVT/PE, bleeding, infection, transfusion risk all discussed.  We reviewed her medications and she does not need to stop anything pre-operatively.  Surgery will be scheduled and the patient will be called.  We will try to do this in two weeks.               A:  Left 4cm ovarian dermoid LLQ pain H/O plasma cell vulvitis--biopsy proven Diabetes--followed by Dr. Talmage Nap  P:  Laparoscopic BSO planned Pre-operative Ca-125 Clobetasol 0.05% ointment BID for up to 10 days.  Reviewed the many topical irritants that can cause vulvitis.  Pt will change toilet paper.   An After Visit Summary was printed and given to the patient.

## 2012-05-05 NOTE — Telephone Encounter (Signed)
From what the report read it does not appear that this is an emergency although she does need to see a GYN doctor and certainly she can make an appointment to see a female physician at her choice and 3 days does not appear to be a limit as it does not appear to be an emergency.

## 2012-05-06 ENCOUNTER — Encounter: Payer: Self-pay | Admitting: Obstetrics & Gynecology

## 2012-05-09 ENCOUNTER — Encounter (HOSPITAL_COMMUNITY): Payer: Self-pay | Admitting: Pharmacist

## 2012-05-12 ENCOUNTER — Encounter (HOSPITAL_COMMUNITY)
Admission: RE | Admit: 2012-05-12 | Discharge: 2012-05-12 | Disposition: A | Discharge status: Home / Self Care | Payer: BC Managed Care – PPO | Source: Ambulatory Visit | Attending: Obstetrics & Gynecology | Admitting: Obstetrics & Gynecology

## 2012-05-12 ENCOUNTER — Encounter (HOSPITAL_COMMUNITY): Payer: Self-pay

## 2012-05-12 LAB — BASIC METABOLIC PANEL
BUN: 15 mg/dL (ref 6–23)
CO2: 29 mEq/L (ref 19–32)
Calcium: 10 mg/dL (ref 8.4–10.5)
Chloride: 100 mEq/L (ref 96–112)
Creatinine, Ser: 0.65 mg/dL (ref 0.50–1.10)
GFR calc Af Amer: >90 mL/min (ref >90)
GFR calc non Af Amer: >90 mL/min (ref >90)
Glucose, Bld: 103 mg/dL — ABNORMAL HIGH (ref 70–99)
Potassium: 4.5 mEq/L (ref 3.5–5.1)
Sodium: 137 mEq/L (ref 135–145)

## 2012-05-12 LAB — SURGICAL PCR SCREEN
MRSA, PCR: NEGATIVE
Staphylococcus aureus: NEGATIVE

## 2012-05-12 LAB — CBC
HCT: 37.5 % (ref 36.0–46.0)
Hemoglobin: 12.3 g/dL (ref 12.0–15.0)
MCH: 27.6 pg (ref 26.0–34.0)
MCHC: 32.8 g/dL (ref 30.0–36.0)
MCV: 84.3 fL (ref 78.0–100.0)
Platelets: 274 10*3/uL (ref 150–400)
RBC: 4.45 MIL/uL (ref 3.87–5.11)
RDW: 15.3 % (ref 11.5–15.5)
WBC: 8.8 10*3/uL (ref 4.0–10.5)

## 2012-05-12 NOTE — Patient Instructions (Addendum)
[address] KIR[address]GER  05/12/2012   Your procedure is scheduled on:  05/19/12  Enter through the Main Entrance of [facility] at 830 AM.  Pick up the phone at the desk and dial [phone].   Call this number if you have problems the morning of surgery: [phone]   Remember:   Do not eat food:After Midnight.  Do not drink clear liquids: After Midnight.  Take these medicines the morning of surgery with A SIP OF WATER: Do not take Glucophage for 24hrs before surgery. May take Thyroid and do take Nexium.   Do not wear jewelry, make-up or nail polish.  Do not wear lotions, powders, or perfumes. You may wear deodorant.  Do not shave 48 hours prior to surgery.  Do not bring valuables to the hospital.  Contacts, dentures or bridgework may not be worn into surgery.  Leave suitcase in the car. After surgery it may be brought to your room.  For patients admitted to the hospital, checkout time is 11:00 AM the day of discharge.   Patients discharged the day of surgery will not be allowed to drive home.  Name and phone number of your driver: husband  Special Instructions: Shower using CHG 2 nights before surgery and the night before surgery.  If you shower the day of surgery use CHG.  Use special wash - you have one bottle of CHG for all showers.  You should use approximately 1/3 of the bottle for each shower.   Please read over the following fact sheets that you were given: MRSA Information

## 2012-05-13 ENCOUNTER — Other Ambulatory Visit: Payer: Self-pay | Admitting: Obstetrics & Gynecology

## 2012-05-13 ENCOUNTER — Telehealth: Payer: Self-pay | Admitting: *Deleted

## 2012-05-13 ENCOUNTER — Telehealth: Payer: Self-pay | Admitting: Obstetrics & Gynecology

## 2012-05-13 ENCOUNTER — Other Ambulatory Visit (INDEPENDENT_AMBULATORY_CARE_PROVIDER_SITE_OTHER): Payer: BC Managed Care – PPO

## 2012-05-13 DIAGNOSIS — N838 Other noninflammatory disorders of ovary, fallopian tube and broad ligament: Secondary | ICD-10-CM

## 2012-05-13 DIAGNOSIS — N839 Noninflammatory disorder of ovary, fallopian tube and broad ligament, unspecified: Secondary | ICD-10-CM

## 2012-05-13 LAB — CA 125: CA 125: 4.5 U/mL (ref 0.0–30.2)

## 2012-05-13 NOTE — Telephone Encounter (Signed)
Surgery instructions reviewed and post op appointment scheduled.  Patient instruction form mailed.

## 2012-05-13 NOTE — Telephone Encounter (Signed)
Patient states she is returning Sally's call. °

## 2012-05-13 NOTE — Telephone Encounter (Signed)
Advised Dr Hyacinth Meeker recommends CA 125.  Per pt request, tried to add to hosp preop labs but they did not have the correct tube.  Patient will come in today for CA125.

## 2012-05-14 ENCOUNTER — Telehealth: Payer: Self-pay | Admitting: *Deleted

## 2012-05-14 NOTE — Telephone Encounter (Signed)
Advised of normal CA125 result and CT report has been mailed. Patient reports she still has some intermittent pain.  Advised that although we have good reports so far, surgical removal still recommended.  Patient asking if this results means "no cancer cells?' and advised that this is not a guarantee but is reassuring.  Instructed to call back if pain intolerable prior to surgery.

## 2012-05-14 NOTE — Telephone Encounter (Signed)
Message copied by Alisa Graff on Wed May 14, 2012  1:58 PM ------      Message from: Jerene Bears      Created: Wed May 14, 2012  9:13 AM       Please inform Catherine [name] this is normal.  Also let her know about the CT report being mailed. ------

## 2012-05-18 MED ORDER — METRONIDAZOLE IN NACL 5-0.79 MG/ML-% IV SOLN
500.0000 mg | INTRAVENOUS | Status: AC
  Administered 2012-05-19: 500 mg via INTRAVENOUS
  Filled 2012-05-18: qty 100

## 2012-05-18 MED ORDER — CIPROFLOXACIN IN D5W 400 MG/200ML IV SOLN
400.0000 mg | INTRAVENOUS | Status: AC
  Administered 2012-05-19: 400 mg via INTRAVENOUS
  Filled 2012-05-18 (×2): qty 200

## 2012-05-19 ENCOUNTER — Encounter (HOSPITAL_COMMUNITY): Payer: Self-pay | Admitting: Anesthesiology

## 2012-05-19 ENCOUNTER — Encounter (HOSPITAL_COMMUNITY)
Admission: RE | Disposition: A | Discharge status: Home / Self Care | Payer: Self-pay | Source: Ambulatory Visit | Attending: Obstetrics & Gynecology

## 2012-05-19 ENCOUNTER — Ambulatory Visit (HOSPITAL_COMMUNITY): Payer: BC Managed Care – PPO | Admitting: Anesthesiology

## 2012-05-19 ENCOUNTER — Ambulatory Visit (HOSPITAL_COMMUNITY)
Admission: RE | Admit: 2012-05-19 | Discharge: 2012-05-19 | Disposition: A | Discharge status: Home / Self Care | Payer: BC Managed Care – PPO | Source: Ambulatory Visit | Attending: Obstetrics & Gynecology | Admitting: Obstetrics & Gynecology

## 2012-05-19 DIAGNOSIS — D279 Benign neoplasm of unspecified ovary: Secondary | ICD-10-CM | POA: Diagnosis present

## 2012-05-19 DIAGNOSIS — R1032 Left lower quadrant pain: Secondary | ICD-10-CM | POA: Insufficient documentation

## 2012-05-19 HISTORY — PX: LAPAROSCOPY: SHX197

## 2012-05-19 HISTORY — PX: SALPINGOOPHORECTOMY: SHX82

## 2012-05-19 LAB — GLUCOSE, CAPILLARY
Glucose-Capillary: 90 mg/dL (ref 70–99)
Glucose-Capillary: 136 mg/dL — ABNORMAL HIGH (ref 70–99)

## 2012-05-19 SURGERY — LAPAROSCOPY OPERATIVE
Anesthesia: General | Site: Abdomen | Wound class: Clean Contaminated

## 2012-05-19 MED ORDER — METOCLOPRAMIDE HCL 5 MG/ML IJ SOLN: 10.0000 mg | Freq: Once | INTRAMUSCULAR | Status: DC | PRN

## 2012-05-19 MED ORDER — NEOSTIGMINE METHYLSULFATE 1 MG/ML IJ SOLN
INTRAMUSCULAR | Status: AC
  Filled 2012-05-19: qty 1

## 2012-05-19 MED ORDER — NEOSTIGMINE METHYLSULFATE 1 MG/ML IJ SOLN
INTRAMUSCULAR | Status: DC | PRN
  Administered 2012-05-19: 3 mg via INTRAVENOUS

## 2012-05-19 MED ORDER — DEXAMETHASONE SODIUM PHOSPHATE 10 MG/ML IJ SOLN
INTRAMUSCULAR | Status: DC | PRN
  Administered 2012-05-19: 10 mg via INTRAVENOUS

## 2012-05-19 MED ORDER — FENTANYL CITRATE 0.05 MG/ML IJ SOLN
INTRAMUSCULAR | Status: AC
  Filled 2012-05-19: qty 5

## 2012-05-19 MED ORDER — LIDOCAINE HCL (CARDIAC) 20 MG/ML IV SOLN
INTRAVENOUS | Status: DC | PRN
  Administered 2012-05-19: 60 mg via INTRAVENOUS

## 2012-05-19 MED ORDER — ONDANSETRON HCL 4 MG/2ML IJ SOLN
INTRAMUSCULAR | Status: AC
  Filled 2012-05-19: qty 2

## 2012-05-19 MED ORDER — ROCURONIUM BROMIDE 100 MG/10ML IV SOLN
INTRAVENOUS | Status: DC | PRN
  Administered 2012-05-19: 40 mg via INTRAVENOUS

## 2012-05-19 MED ORDER — KETOROLAC TROMETHAMINE 30 MG/ML IJ SOLN
INTRAMUSCULAR | Status: DC | PRN
  Administered 2012-05-19: 30 mg via INTRAVENOUS

## 2012-05-19 MED ORDER — ONDANSETRON HCL 4 MG/2ML IJ SOLN
INTRAMUSCULAR | Status: DC | PRN
  Administered 2012-05-19: 4 mg via INTRAVENOUS

## 2012-05-19 MED ORDER — MIDAZOLAM HCL 5 MG/5ML IJ SOLN
INTRAMUSCULAR | Status: DC | PRN
  Administered 2012-05-19: 1 mg via INTRAVENOUS

## 2012-05-19 MED ORDER — PROMETHAZINE HCL 25 MG RE SUPP
RECTAL | Status: AC
  Filled 2012-05-19: qty 2

## 2012-05-19 MED ORDER — KETOROLAC TROMETHAMINE 30 MG/ML IJ SOLN
INTRAMUSCULAR | Status: AC
  Filled 2012-05-19: qty 1

## 2012-05-19 MED ORDER — BUPIVACAINE HCL (PF) 0.25 % IJ SOLN
INTRAMUSCULAR | Status: AC
  Filled 2012-05-19: qty 30

## 2012-05-19 MED ORDER — HYDROCODONE-ACETAMINOPHEN 5-300 MG PO TABS: 1.0000 | ORAL_TABLET | Freq: Four times a day (QID) | ORAL | Status: DC | PRN

## 2012-05-19 MED ORDER — MEPERIDINE HCL 25 MG/ML IJ SOLN: 6.2500 mg | INTRAMUSCULAR | Status: DC | PRN

## 2012-05-19 MED ORDER — FENTANYL CITRATE 0.05 MG/ML IJ SOLN: 25.0000 ug | INTRAMUSCULAR | Status: DC | PRN

## 2012-05-19 MED ORDER — LIDOCAINE HCL (CARDIAC) 20 MG/ML IV SOLN
INTRAVENOUS | Status: AC
  Filled 2012-05-19: qty 5

## 2012-05-19 MED ORDER — PROPOFOL 10 MG/ML IV BOLUS
INTRAVENOUS | Status: DC | PRN
  Administered 2012-05-19: 160 mg via INTRAVENOUS

## 2012-05-19 MED ORDER — FENTANYL CITRATE 0.05 MG/ML IJ SOLN
INTRAMUSCULAR | Status: DC | PRN
  Administered 2012-05-19 (×4): 50 ug via INTRAVENOUS

## 2012-05-19 MED ORDER — MIDAZOLAM HCL 2 MG/2ML IJ SOLN
INTRAMUSCULAR | Status: AC
  Filled 2012-05-19: qty 2

## 2012-05-19 MED ORDER — PROMETHAZINE HCL 25 MG RE SUPP
25.0000 mg | Freq: Four times a day (QID) | RECTAL | Status: DC | PRN
  Administered 2012-05-19 (×2): 25 mg via RECTAL

## 2012-05-19 MED ORDER — LACTATED RINGERS IV SOLN
INTRAVENOUS | Status: DC
  Administered 2012-05-19 (×2): via INTRAVENOUS

## 2012-05-19 MED ORDER — BUPIVACAINE HCL (PF) 0.25 % IJ SOLN
INTRAMUSCULAR | Status: DC | PRN
  Administered 2012-05-19: 8 mL

## 2012-05-19 MED ORDER — SODIUM CHLORIDE 0.9 % IJ SOLN
INTRAMUSCULAR | Status: DC | PRN
  Administered 2012-05-19: 10 mL via INTRAVENOUS

## 2012-05-19 MED ORDER — GLYCOPYRROLATE 0.2 MG/ML IJ SOLN
INTRAMUSCULAR | Status: AC
  Filled 2012-05-19: qty 3

## 2012-05-19 MED ORDER — PROPOFOL 10 MG/ML IV EMUL
INTRAVENOUS | Status: AC
  Filled 2012-05-19: qty 20

## 2012-05-19 MED ORDER — GLYCOPYRROLATE 0.2 MG/ML IJ SOLN
INTRAMUSCULAR | Status: DC | PRN
  Administered 2012-05-19: .6 mg via INTRAVENOUS

## 2012-05-19 MED ORDER — EPHEDRINE SULFATE 50 MG/ML IJ SOLN
INTRAMUSCULAR | Status: DC | PRN
  Administered 2012-05-19: 10 mg via INTRAVENOUS
  Administered 2012-05-19: 5 mg via INTRAVENOUS

## 2012-05-19 MED ORDER — ROCURONIUM BROMIDE 50 MG/5ML IV SOLN
INTRAVENOUS | Status: AC
  Filled 2012-05-19: qty 1

## 2012-05-19 SURGICAL SUPPLY — 36 items
ADH SKN CLS APL DERMABOND .7 (GAUZE/BANDAGES/DRESSINGS) ×4
APL SKNCLS STERI-STRIP NONHPOA (GAUZE/BANDAGES/DRESSINGS)
BAG SPEC RTRVL LRG 6X4 10 (ENDOMECHANICALS) ×2
BENZOIN TINCTURE PRP APPL 2/3 (GAUZE/BANDAGES/DRESSINGS) IMPLANT
CABLE HIGH FREQUENCY MONO STRZ (ELECTRODE) ×3 IMPLANT
CATH ROBINSON RED A/P 16FR (CATHETERS) IMPLANT
CHLORAPREP W/TINT 26ML (MISCELLANEOUS) ×3 IMPLANT
CLOTH BEACON ORANGE TIMEOUT ST (SAFETY) ×3 IMPLANT
DERMABOND ADVANCED (GAUZE/BANDAGES/DRESSINGS) ×2
DERMABOND ADVANCED .7 DNX12 (GAUZE/BANDAGES/DRESSINGS) IMPLANT
FILTER SMOKE EVAC LAPAROSHD (FILTER) ×1 IMPLANT
FORCEPS CUTTING 33CM 5MM (CUTTING FORCEPS) ×1 IMPLANT
GLOVE BIOGEL PI IND STRL 7.0 (GLOVE) ×4 IMPLANT
GLOVE BIOGEL PI INDICATOR 7.0 (GLOVE) ×2
GLOVE ECLIPSE 6.5 STRL STRAW (GLOVE) ×6 IMPLANT
GOWN PREVENTION PLUS LG XLONG (DISPOSABLE) ×3 IMPLANT
GOWN PREVENTION PLUS XLARGE (GOWN DISPOSABLE) ×3 IMPLANT
NEEDLE INSUFFLATION 120MM (ENDOMECHANICALS) ×3 IMPLANT
PACK LAPAROSCOPY BASIN (CUSTOM PROCEDURE TRAY) ×3 IMPLANT
POUCH SPECIMEN RETRIEVAL 10MM (ENDOMECHANICALS) ×1 IMPLANT
PROTECTOR NERVE ULNAR (MISCELLANEOUS) ×3 IMPLANT
SEALER TISSUE G2 CVD JAW 35 (ENDOMECHANICALS) IMPLANT
SEALER TISSUE G2 CVD JAW 45CM (ENDOMECHANICALS)
SET IRRIG TUBING LAPAROSCOPIC (IRRIGATION / IRRIGATOR) IMPLANT
STRIP CLOSURE SKIN 1/2X4 (GAUZE/BANDAGES/DRESSINGS) IMPLANT
STRIP CLOSURE SKIN 1/4X4 (GAUZE/BANDAGES/DRESSINGS) ×3 IMPLANT
SUT VICRYL 0 UR6 27IN ABS (SUTURE) IMPLANT
SUT VICRYL 4-0 PS2 18IN ABS (SUTURE) ×3 IMPLANT
SYR 30ML LL (SYRINGE) IMPLANT
TOWEL OR 17X24 6PK STRL BLUE (TOWEL DISPOSABLE) ×6 IMPLANT
TRAY FOLEY CATH 14FR (SET/KITS/TRAYS/PACK) ×3 IMPLANT
TROCAR BALLN 12MMX100 BLUNT (TROCAR) IMPLANT
TROCAR XCEL NON-BLD 11X100MML (ENDOMECHANICALS) IMPLANT
TROCAR XCEL NON-BLD 5MMX100MML (ENDOMECHANICALS) IMPLANT
WARMER LAPAROSCOPE (MISCELLANEOUS) ×3 IMPLANT
WATER STERILE IRR 1000ML POUR (IV SOLUTION) ×3 IMPLANT

## 2012-05-19 NOTE — Anesthesia Preprocedure Evaluation (Addendum)
Anesthesia Evaluation  Patient identified by MRN, date of birth, ID band Patient awake    Reviewed: Allergy & Precautions, H&P , NPO status , Patient's Chart, lab work & pertinent test results  Airway Mallampati: III TM Distance: >3 FB Neck ROM: Full    Dental no notable dental hx. (+) Teeth Intact   Pulmonary asthma ,  breath sounds clear to auscultation  Pulmonary exam normal       Cardiovascular negative cardio ROS  Rhythm:Regular Rate:Normal     Neuro/Psych Anxiety negative neurological ROS     GI/Hepatic Neg liver ROS, GERD-  Controlled and Medicated,Hx/o Barrett's Esophagus IBS Anal Fissure   Endo/Other  diabetes, Well Controlled, Type 2, Oral Hypoglycemic AgentsHypothyroidism Hyperlipidemia  Renal/GU negative Renal ROS  negative genitourinary   Musculoskeletal negative musculoskeletal ROS (+)   Abdominal   Peds  Hematology negative hematology ROS (+)   Anesthesia Other Findings   Reproductive/Obstetrics Left Dermoid Cyst LLQ Pain                          Anesthesia Physical Anesthesia Plan  ASA: II  Anesthesia Plan: General   Post-op Pain Management:    Induction: Intravenous  Airway Management Planned: Oral ETT  Additional Equipment:   Intra-op Plan:   Post-operative Plan: Extubation in OR  Informed Consent: I have reviewed the patients History and Physical, chart, labs and discussed the procedure including the risks, benefits and alternatives for the proposed anesthesia with the patient or authorized representative who has indicated his/her understanding and acceptance.   Dental advisory given  Plan Discussed with: CRNA, Anesthesiologist and Surgeon  Anesthesia Plan Comments:         Anesthesia Quick Evaluation

## 2012-05-19 NOTE — Anesthesia Procedure Notes (Signed)
Procedure Name: Intubation Date/Time: 05/19/2012 10:20 AM Performed by: Graciela Husbands Pre-anesthesia Checklist: Suction available, Emergency Drugs available, Timeout performed, Patient identified and Patient being monitored Patient Re-evaluated:Patient Re-evaluated prior to inductionOxygen Delivery Method: Circle system utilized Preoxygenation: Pre-oxygenation with 100% oxygen Intubation Type: IV induction Ventilation: Mask ventilation without difficulty Laryngoscope Size: Mac and 3 Grade View: Grade I Tube type: Oral Tube size: 7.0 mm Number of attempts: 1 Airway Equipment and Method: Stylet Secured at: 20 cm Tube secured with: Tape Dental Injury: Teeth and Oropharynx as per pre-operative assessment

## 2012-05-19 NOTE — Anesthesia Postprocedure Evaluation (Signed)
  Anesthesia Post-op Note  Patient: Catherine [name]  Procedure(s) Performed: Procedure(s) with comments: LAPAROSCOPY OPERATIVE (N/A) SALPINGO OOPHORECTOMY (Bilateral) - including left dermoid cyst  Patient Location: PACU  Anesthesia Type:General  Level of Consciousness: awake, alert  and oriented  Airway and Oxygen Therapy: Patient Spontanous Breathing  Post-op Pain: none  Post-op Assessment: Post-op Vital signs reviewed, Patient's Cardiovascular Status Stable, Respiratory Function Stable, Patent Airway, No signs of Nausea or vomiting and Pain level controlled  Post-op Vital Signs: Reviewed and stable  Complications: No apparent anesthesia complications

## 2012-05-19 NOTE — Op Note (Signed)
05/19/2012  11:42 AM  PATIENT:  Catherine [name]  62 y.o. female  PRE-OPERATIVE DIAGNOSIS:  left dermoid 4cm;left lower quad pain  POST-OPERATIVE DIAGNOSIS:  left dermoid 4cm;left lower quad pain  PROCEDURE:  Procedure(s): LAPAROSCOPY OPERATIVE BILATERAL SALPINGO OOPHORECTOMY  SURGEON:  Loredana Medellin SUZANNE  ASSISTANTS: ROMINE, CYNTHIA   ANESTHESIA:   general  ESTIMATED BLOOD LOSS: 10cc  BLOOD ADMINISTERED:none   FLUIDS: 1500cc LR  UOP: 250cc  SPECIMEN:  Bilateral tubes and ovaries  DISPOSITION OF SPECIMEN:  PATHOLOGY  FINDINGS: normal upper abdomen, filmy adhesions from the left colon to the left ovary, enlarged left ovary (when opened outside of the patient, yellowish thick material was inside the cyst), normal right tube and ovary  DESCRIPTION OF OPERATION: Patient was taken to the operating room. Informed consent was present and on the chart. Once in the operating room the patient's legs were initially in the supine position. General endotracheal anesthesia was administered by the anesthesia staff without difficulty. Once adequate anesthesia was confirmed, the legs were placed in [place] stirrups. SCDs were on her lower legs and functioning properly. A running IV was in the left arm. Her arms were tucked by the side. The legs were then lifted to the high lithotomy position. Chlor prep was used to prep the abdominal skin and the inner thighs. A non-iodine-containing scrub was used to prep the vagina and perineum. A bivalve speculum was placed in the vagina. The anterior lip of the cervix was grasped with single-tooth tenaculum. A Milex dilator was used to dilate the cervix. The uterus then sounded to 7 cm. A Hulka clamp was passed through the cervical os and attached to the anterior lip of the cervix as a means of manipulating uterus during the procedure. The speculum was removed. A Foley catheter was placed to straight drain. The legs were then lowered to the low lithotomy position.  3 minutes had passed into the patient was draped in a normal standard fashion.  Attention was turned abdomen. Quarter percent Marcaine was used anesthetize the umbilicus. A 10 mm skin incision was made with a #11 blade. The subcutaneous fat and tissue was dissected. That was elevated. A Verees needle was inserted directly. A syringe of normal saline was attached the needle. An aspiration of fluid was performed. No blood or fluid was noted. Fluid injected easily. Then a final aspiration was performed. No blood fluid or saline was noted. The syringe was removed from the varies needle and the saline dripped easily down to the pelvis. Pneumoperitoneum was achieved with low flow of gas. Once 2 and half liters was in the abdomen a Verees needle was removed. A 10 mm laparoscope attached to a low millimeter Optiview trocar port were then passed rectally to the abdomen without difficulty. The trocar was removed and intraperitoneal placement was confirmed. CO2 gas was in place on high flow. The abdominal wall was transilluminated a locations for the right and left lower quadrant ports were identified. Quarter percent Marcaine was used anesthetize skin. The skin was nicked with a number level blade. 5 mm disposable non-bladed trocar ports were passed directly in the abdomen. The ports were removed. The patient was placed in Trendelenburg positioning. Using a blunt probe and then a smooth grasper the bowel scooped out of the way so the ureters were seen on each side of the pelvis. The left ovary did have some adhesions to the left colon. These were filmy. The right ovary was normal. The uterus was normal. The bladder is  normal. The posterior cul-de-sac was normal. The upper abdomen was normal. Using endoscopic scissors with out cautery attached to the adhesions were taken down sharply. This freed the IP ligament. Then the endoscopic gyrus was obtained. The left IP ligament was serially clamped cauterized and incised using the  bipolar cautery of the Gyrus. The sound indicator was used to ensure that adequate cautery was present before cutting to the ligament. The IP ligament was completely incised, then the mesosalpinx along the fallopian tube was clamped cauterized and incised. This was done down to the level of the cornua of the fallopian tube. The tube was then clamped across at the edge of the uterus, cauterized, and incised. This freed left tube and ovary which contained a dermoid cyst. Then a similar fashion with the right IP ligament on stretch the IP ligament was serially clamped cauterized and incised. Mesosalpinx was then clamped, cauterized, and incised down to the level of the cornua of the fallopian tube. The tube was then clamped, cauterized, and incised close to the uterus. This freed the right tube and ovary. There is a tiny amount of bleeding from the right IP ligaments and this was re-clamped and cauterized. Excellent hemostasis was present.  The 10 mm scope was removed. A 5 mm scope was then placed in the right lower quadrant port. An Endo Catch bag was placed in the midline port. The tubes and ovaries are placed in this bag. Then they were brought out to the umbilicus. A dermoid was cut in the bag which decompress the size of the ovary and allowed to bag to be brought out through the midline without any spilling of dermoid contents. Then a 10 mm scope was then used to revisualize the pelvis. Irrigation was performed of both IP ligaments the ureter was removed. No bleeding was noted. The pneumoperitoneum was decompressed while visualizing both IP ligaments. Again no bleeding was noted. The right left lower quadrant ports were removed without difficulty. Finally the laparoscope was removed and the umbilical port was removed.  The patient was taken out of Trendelenburg positioning.  The incision at the umbilicus was closed with a figure-of-eight suture of #0 Vicryl. The skin was closed with subcuticular stitch of 3-0  Vicryl. The incisions were cleansed and all 3 were closed with Dermabond. Legs were then lifted to the high lithotomy position. Vaginal instruments were removed. There was minimal vaginal bleeding. Foley catheter was removed. The patient's legs are placed back in the supine position. Sponge, laps, needle, and instrument counts were correct x2.  30 mg IV of Toradol was given. Patient was extubated at this point. She is in stable condition and taken to the recovery room.  COUNTS:  YES  PLAN OF CARE: Transfer to PACU

## 2012-05-19 NOTE — Transfer of Care (Signed)
Immediate Anesthesia Transfer of Care Note  Patient: Catherine [name]  Procedure(s) Performed: Procedure(s) with comments: LAPAROSCOPY OPERATIVE (N/A) SALPINGO OOPHORECTOMY (Bilateral) - including left dermoid cyst  Patient Location: PACU  Anesthesia Type:General  Level of Consciousness: awake, alert  and oriented  Airway & Oxygen Therapy: Patient Spontanous Breathing and Patient connected to nasal cannula oxygen  Post-op Assessment: Report given to PACU RN and Post -op Vital signs reviewed and stable  Post vital signs: Reviewed and stable  Complications: No apparent anesthesia complications

## 2012-05-19 NOTE — H&P (Signed)
Catherine [name] is an 62 y.o. female G4P2022 Married [place] F here for laparoscopic BSO due to LLQ pain and 4cm left dermoid.  Patient has a CT scan showing the finding after experiencing abdominal pain while traveling to [place].  She was treated with antibiotics and saw Dr. [name] when she returned to the [place]. The pain is a constant ache that never really goes away and doesn't seem to get worse. The pain is maybe 3 or 4 out of 10. Nothing OTC seems to help.   Pertinent Gynecological History: Menses: post-menopausal Bleeding: none Contraception: post menopausal status DES exposure: denies Blood transfusions: none Sexually transmitted diseases: no past history Previous GYN Procedures: none  Last mammogram: normal Date: 2013  Last pap: normal Date: 12/13 OB History: G4, P2002/   Menstrual History: Patient's last menstrual period was 01/08/2009.    Past Medical History  Diagnosis Date  . Diverticulosis     Dr Juanda Chance  . IBS (irritable bowel syndrome)   . Anal fissure   . Barrett esophagus     normal now  . Hyperlipidemia   . Asthma   . GERD (gastroesophageal reflux disease)   . Hyperplastic polyps of stomach   . Gastritis   . Osteopenia   . Hypothyroidism   . Anxiety   . Diabetes mellitus     Past Surgical History  Procedure Laterality Date  . Tubal ligation      with D&C due to Incomplete AB    Family History  Problem Relation Age of Onset  . Thyroid disease Son   . Lymphoma Brother   . Colon cancer Neg Hx   . Diabetes Sister     maternal aunt  . Hypertension Father   . Stroke Father   . Diabetes Father 90    on po meds  . Diabetes Mother 90    on insulin  . Heart disease Brother     heart stent    Social History:  reports that she has never smoked. She has never used smokeless tobacco. She reports that she does not drink alcohol or use illicit drugs.  Allergies:  Allergies  Allergen Reactions  . Amoxicillin Diarrhea  . Erythromycin Diarrhea  .  Penicillins Diarrhea    Prescriptions prior to admission  Medication Sig Dispense Refill  . clobetasol ointment (TEMOVATE) 0.05 % Apply topically 2 (two) times daily. Apply as directed twice daily  60 g  1  . esomeprazole (NEXIUM) 40 MG capsule Take 1 capsule (40 mg total) by mouth 2 (two) times daily.  60 capsule  10  . fexofenadine (ALLEGRA) 180 MG tablet Take 180 mg by mouth at bedtime as needed.      . hydrocortisone cream 1 % Apply to anal fissure as needed.  30 g  2  . levothyroxine (SYNTHROID, LEVOTHROID) 25 MCG tablet 25-50 mcg. Take 1 by mouth every day but 2 tablets on Tues and Wed      . metFORMIN (GLUCOPHAGE) 500 MG tablet Take 500 mg by mouth 2 (two) times daily with a meal.       . Multiple Vitamins-Minerals (CENTRUM PO) Take 1 tablet by mouth daily.        . Probiotic Product (PROBIOTIC PO) Take 1 tablet by mouth as needed.         Review of Systems  All other systems reviewed and are negative.    Blood pressure 115/83, pulse 72, temperature 97.7 F (36.5 C), temperature source Oral, resp. rate 18,  last menstrual period 01/08/2009, SpO2 100.00%. Physical Exam  Constitutional: She is oriented to person, place, and time. She appears well-developed and well-nourished.  HENT:  Head: Normocephalic and atraumatic.  Neck: Normal range of motion. Neck supple.  Cardiovascular: Normal rate and regular rhythm.   Respiratory: Effort normal and breath sounds normal.  GI: Soft. Bowel sounds are normal.  Neurological: She is alert and oriented to person, place, and time.  Skin: Skin is warm and dry.  Psychiatric: She has a normal mood and affect.    Results for orders placed during the hospital encounter of 05/19/12 (from the past 24 hour(s))  GLUCOSE, CAPILLARY     Status: None   Collection Time    05/19/12  8:35 AM      Result Value Range   Glucose-Capillary 90  70 - 99 mg/dL    No results found.  Assessment/Plan: 62 y.o. female G4P2022 Married [place] F here for  laparoscopic BSO due to LLQ pain and 4cm left dermoid.  Risks and benefits discussed at last office visit.  No new questions today.  No changes to PMed Hx since prior visit.  Valentina Shaggy SUZANNE 05/19/2012, 9:20 AM

## 2012-05-20 ENCOUNTER — Encounter (HOSPITAL_COMMUNITY): Payer: Self-pay | Admitting: Obstetrics & Gynecology

## 2012-05-21 ENCOUNTER — Telehealth: Payer: Self-pay | Admitting: *Deleted

## 2012-05-21 NOTE — Telephone Encounter (Signed)
Patient notified of pathologuy results per dr Rondel Baton instruction.  Patient states she is feeling better each day.  I asked about bladder and bowel function and patient reports slight discomfort with voiding.  Reports it does seem to be improving but is also irritiated when she washes with water after voiding.  Denies frequency, back pain or fever. 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  She is using a cream that was given to her for daily use and she has increased to BID due to irriatation.  Instructed to monitor, observe and call with update tomm and I will review with Dr Hyacinth Meeker.

## 2012-05-21 NOTE — Telephone Encounter (Signed)
Message copied by Alisa Graff on Wed May 21, 2012  5:49 PM ------      Message from: Jerene Bears      Created: Wed May 21, 2012  3:49 PM       Sallly, Can you please call my surgery pt from [facility] and let her know pathology is negative.  She had a benign dermoid on the left and normal ovary on the right.  Both tubes were normal as well.  Thanks. ------

## 2012-05-21 NOTE — Telephone Encounter (Signed)
Message copied by Alisa Graff on Wed May 21, 2012  5:34 PM ------      Message from: Jerene Bears      Created: Wed May 21, 2012  3:49 PM       Sallly, Can you please call my surgery pt from [facility] and let her know pathology is negative.  She had a benign dermoid on the left and normal ovary on the right.  Both tubes were normal as well.  Thanks. ------

## 2012-05-22 NOTE — Telephone Encounter (Signed)
Patient reports that she has an open sore area with red irritated skin around vagina that is not getting any better since her surgery.  She feels like if it was related to betadine prep it would be beginning to improve.  She does not think this is urinary any more but the discomfort is when urine contacts her skin and she is very worried about this.  States the ointment she is using twice daily to protect the skin does not seem to be making any improvement. ?OV

## 2012-05-22 NOTE — Telephone Encounter (Signed)
Offered appt today and patient declines, requests tomm appt in PM.  Given appt at 10 am (no pm avail).

## 2012-05-22 NOTE — Telephone Encounter (Signed)
OV is fine.  OK to add on to schedule

## 2012-05-22 NOTE — Telephone Encounter (Signed)
Patient reporting back to [name] she is still having some trouble with a spot in her vagina. Patient returning Catherine [name]'s call.

## 2012-05-22 NOTE — Telephone Encounter (Signed)
Not a problem.  Not uncommon.  Please reassure her.  Irritation very normal because of Betadine prep.  Please watch for any worsening symptoms or any symptoms of UTI.  Has post op appt in about two weeks.

## 2012-05-23 ENCOUNTER — Encounter: Payer: Self-pay | Admitting: Obstetrics & Gynecology

## 2012-05-23 ENCOUNTER — Ambulatory Visit (INDEPENDENT_AMBULATORY_CARE_PROVIDER_SITE_OTHER): Payer: BC Managed Care – PPO | Admitting: Obstetrics & Gynecology

## 2012-05-23 VITALS — BP 130/76 | HR 80 | Ht 63.0 in | Wt 152.0 lb

## 2012-05-23 DIAGNOSIS — N9089 Other specified noninflammatory disorders of vulva and perineum: Secondary | ICD-10-CM

## 2012-05-23 MED ORDER — ESTRADIOL 0.1 MG/GM VA CREA: TOPICAL_CREAM | VAGINAL | Status: DC

## 2012-05-23 NOTE — Progress Notes (Signed)
62 y.o.Married [place] female [phone] three days post-op from laparoscopic BSO.  C/O vulvar irritation since surgery.  Still spotting some.  No urinary symptoms.    Exam:  Gen:  Alert, NAD     Abd:  Soft, mildly tender, not distended GYN:      Ext: erythematous c/w atrophy                AVW:[id number], no dishcarge   Reviewed pathology and pictures with patient.  All questions answered.    NW:[id number] changes, s/p BSO due to dermoid  QM:VHQIONG externally bid until improved.  Samples given.  Has follow-up two weeks.

## 2012-05-23 NOTE — Patient Instructions (Signed)
Use estrace cream as directed

## 2012-05-26 ENCOUNTER — Telehealth: Payer: Self-pay | Admitting: Obstetrics & Gynecology

## 2012-05-26 NOTE — Telephone Encounter (Signed)
Patient came in on Friday morning for checkup and gave her urine sample. She wants to know if her urine sample was clear. She asked to speak directly with Dr.Miller's nurse.

## 2012-05-26 NOTE — Telephone Encounter (Signed)
Patient aware no urine tests were performed.

## 2012-05-28 NOTE — Telephone Encounter (Signed)
i agree. Thanks.

## 2012-05-28 NOTE — Telephone Encounter (Signed)
Patient just had surgery and the stiches are loose on her left abdominal side and she is looking for some assistance as to what to do; she has an appointment this upcoming Tuesday with Dr.Miller but she would like to know what to do in the meantime (such as if she should put neosporin, ext.).

## 2012-05-28 NOTE — Telephone Encounter (Signed)
Patient notified Dr Hyacinth Meeker agrees with info I gave her.

## 2012-05-28 NOTE — Telephone Encounter (Signed)
Patient concerned that "scotch tape" type material came off incision on left side of abdomen and looks "a little open". She is concerned about how to care for incision and if ok to shower. Advied shower fine, rinse well, keep area clean and dry and Neosporin not needed. Call for redness or swelling. Patient wants to be sure dr Hyacinth Meeker agrees with this since coming up on long weekend. Advised Dr Hyacinth Meeker reviews all calls and will let me know if anything else to be done.

## 2012-05-29 ENCOUNTER — Ambulatory Visit (INDEPENDENT_AMBULATORY_CARE_PROVIDER_SITE_OTHER): Payer: BC Managed Care – PPO | Admitting: Obstetrics & Gynecology

## 2012-05-29 ENCOUNTER — Encounter: Payer: Self-pay | Admitting: Obstetrics & Gynecology

## 2012-05-29 ENCOUNTER — Telehealth: Payer: Self-pay | Admitting: Obstetrics & Gynecology

## 2012-05-29 VITALS — BP 100/62 | HR 80 | Temp 98.2°F | Wt 151.0 lb

## 2012-05-29 DIAGNOSIS — R229 Localized swelling, mass and lump, unspecified: Secondary | ICD-10-CM

## 2012-05-29 NOTE — Telephone Encounter (Signed)
Silver dollar sized swelling on left side, near rib cage."Hurts" Please call

## 2012-05-29 NOTE — Patient Instructions (Signed)
Please call [phone] over the weekend and ask for Dr. Farrel Gobble if you have any concerns.

## 2012-05-29 NOTE — Progress Notes (Signed)
62 y.o. [id number] Married [place] female here for c/o left upper quadrant swelling and tenderness.  Pt is 10 days pos-op from laparoscopic BSO due to left dermoid.  Surgery went well.  Pathology showed dermoid.  She has called multiple times and I have already seen her once post-operatively.  She has complained of vaginal irritation, dysuria, a concern that one of her incisions was coming opened, bruising around the incisions, and now this tender and "swollen" area in left upper quadrant.  No nausea, vomiting, diarrhea, constipation.  No vaginal bleeding or discharge.  Dysuria she had immediately after surgery is much better.  I felt that was due to catheterization and postmenopausal atrophic changes. No SOB or palpitations.  No dizziness.  No fevers.  ROS:  Pertinent items are noted in HPI.  Otherwise, a comprehensive ROS was negative.  Exam:   BP 100/62  Pulse 80  Temp(Src) 98.2 F (36.8 C) (Oral)  Wt 151 lb (68.493 kg)  BMI 26.76 kg/m2  LMP 01/08/2009  General appearance: alert, cooperative and appears stated age Lungs: clear to auscultation bilaterally Heart: regular rate and rhythm Abdomen: soft, non-tender; bowel sounds normal; no masses,  no organomegaly, I cannot palpate any swollen areas, she does have some soft tissue tenderness to skin just at edge of ribs on left side Extremities: extremities normal, atraumatic, no cyanosis or edema Skin: Skin color, texture, turgor normal. Non raised erythematous rash just under ribs in the midline Lymph nodes: Cervical, supraclavicular, and axillary nodes normal. No abnormal inguinal nodes palpated Neurologic: Grossly normal  A:  S/P LSO due to dermoid Tenderness along left inferior ribs, subjective tissue swelling to patient  P:   As I cannot find anything significant on physical exam and patient has normal vital signs, I've advised her just to watch.  If symptoms worsen, she is to call on-call physician.  I will see her Tuesday for her  post-op.  An After Visit Summary was printed and given to the patient.

## 2012-05-29 NOTE — Telephone Encounter (Signed)
Pt. Complaining about swelling on the left side near her rib cage that hurts, the size of a silver dollar. I Spoke with Dr. Hyacinth Meeker ok to come in today at 11:45. Pt was ok with this.

## 2012-06-03 ENCOUNTER — Encounter: Payer: Self-pay | Admitting: Obstetrics & Gynecology

## 2012-06-03 ENCOUNTER — Ambulatory Visit (INDEPENDENT_AMBULATORY_CARE_PROVIDER_SITE_OTHER): Payer: BC Managed Care – PPO | Admitting: Obstetrics & Gynecology

## 2012-06-03 VITALS — BP 130/80 | HR 76 | Resp 16 | Wt 151.0 lb

## 2012-06-03 DIAGNOSIS — D271 Benign neoplasm of left ovary: Secondary | ICD-10-CM

## 2012-06-03 DIAGNOSIS — D279 Benign neoplasm of unspecified ovary: Secondary | ICD-10-CM

## 2012-06-03 MED ORDER — LIDOCAINE 5 % EX OINT: TOPICAL_OINTMENT | Freq: Every day | CUTANEOUS | Status: DC

## 2012-06-03 NOTE — Progress Notes (Deleted)
Subjective:     Patient ID: Catherine [name], female   DOB: 10/03/1950, 62 y.o.   MRN: [id number]  HPI   Review of Systems     Objective:   Physical Exam     Assessment:     ***    Plan:     ***

## 2012-06-05 NOTE — Progress Notes (Signed)
Subjective:     Patient ID: Catherine [name], female   DOB: 1950-02-02, 62 y.o.   MRN: [id number]  HPI Comments: Patient here for two week post-op visit from laparoscopic BSO due to left 5cm dermoid.  Pathology reviewed with patient.  She reports vulvar pain is better.  Still using external Premarin cream.  Reports no issues with urination now.  No vaginal bleeding or discharge.  Abdominal "swelling" she noted at last visit is better.    Review of Systems  Gastrointestinal: Negative for abdominal distention.  Genitourinary: Negative for dysuria, vaginal discharge and pelvic pain.       Objective:   Physical Exam  Constitutional: She appears well-developed and well-nourished.  Abdominal: Soft. Bowel sounds are normal. She exhibits no distension. There is no tenderness.  Genitourinary: There is erythema (and atrophic changes) around the vagina.       Assessment:     S/p BSO for LLQ pain, Left dermoid Atrophic changes     Plan:     Continue external estrogen for two more weeks.  Then may resume external steroids as previously prescribed.   AEX 12/14 will be scheduled.

## 2012-08-01 ENCOUNTER — Encounter: Payer: Self-pay | Admitting: *Deleted

## 2012-09-02 ENCOUNTER — Ambulatory Visit: Payer: BC Managed Care – PPO | Admitting: Internal Medicine

## 2012-09-17 ENCOUNTER — Ambulatory Visit (INDEPENDENT_AMBULATORY_CARE_PROVIDER_SITE_OTHER): Payer: BC Managed Care – PPO | Admitting: Internal Medicine

## 2012-09-17 ENCOUNTER — Encounter: Payer: Self-pay | Admitting: Internal Medicine

## 2012-09-17 VITALS — BP 100/60 | HR 76 | Ht 62.5 in | Wt 152.2 lb

## 2012-09-17 DIAGNOSIS — R1032 Left lower quadrant pain: Secondary | ICD-10-CM

## 2012-09-17 DIAGNOSIS — R932 Abnormal findings on diagnostic imaging of liver and biliary tract: Secondary | ICD-10-CM

## 2012-09-17 DIAGNOSIS — K8020 Calculus of gallbladder without cholecystitis without obstruction: Secondary | ICD-10-CM

## 2012-09-17 DIAGNOSIS — K2270 Barrett's esophagus without dysplasia: Secondary | ICD-10-CM

## 2012-09-17 MED ORDER — ESOMEPRAZOLE MAGNESIUM 40 MG PO CPDR: 40.0000 mg | DELAYED_RELEASE_CAPSULE | Freq: Two times a day (BID) | ORAL | Status: AC

## 2012-09-17 NOTE — Progress Notes (Signed)
Upon discussing setting up HIDA scan with patient, she states that she would actually like me to wait to schedule the appointment until she can talk to her family about it. She states that she will call me back when she decides what she would like to do.

## 2012-09-17 NOTE — Progress Notes (Signed)
Catherine [name] 1950/11/08 MRN [id number]        History of Present Illness:  This is a 62 year old [place] female followed for many years for gastroesophageal reflux which is currently under good control with Nexium 40 mg twice a day. She at one point had Barrett's esophagus in 2008 but it was not reproduced on EGD in 2010 and in November 2012. She has been recently treated for diverticulitis with at least 3 courses of Cipro and Flagyl. First time in April 2014 again in May. While in the [place] she was also treated with course of Cipro and Flagyl. CT scan of the abdomen following her return from visit to [place] showed a mass in the left ovary which was removed by Dr. Hyacinth Meeker and found to be a dermoid . She is still having discomfort in the left middle quadrant which is very mild and intermittent and she attributes it to  metformin which causes her to have diarrhea and urgency. She has cut back from 500 mg twice a day to 500 mg at bedtime. 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 The CT scan also found incidental cholelithiasis in otherwise  normal appearing gallbladder, normal thickness wall and common bile duct   Past Medical History  Diagnosis Date  . Diverticulosis     Dr Juanda Chance  . IBS (irritable bowel syndrome)   . Anal fissure   . Barrett esophagus     normal now  . Hyperlipidemia   . Asthma   . GERD (gastroesophageal reflux disease)   . Hyperplastic polyps of stomach   . Gastritis   . Osteopenia   . Hypothyroidism   . Anxiety   . Diabetes mellitus   . Cholelithiasis    Past Surgical History  Procedure Laterality Date  . Tubal ligation      with D&C due to Incomplete AB  . Laparoscopy N/A 05/19/2012    Procedure: LAPAROSCOPY OPERATIVE;  Surgeon: Annamaria Boots, MD;  Location: [facility];  Service: Gynecology;  Laterality: N/A;  . Salpingoophorectomy Bilateral  05/19/2012    Procedure: SALPINGO OOPHORECTOMY;  Surgeon: Annamaria Boots, MD;  Location: [facility];  Service: Gynecology;  Laterality: Bilateral;  including left dermoid cyst    reports that she has never smoked. She has never used smokeless tobacco. She reports that she does not drink alcohol or use illicit drugs. family history includes Diabetes in her sister; Diabetes (age of onset: 61) in her father and mother; Heart disease in her brother; Hypertension in her father; Lymphoma in her brother; Stroke in her father; Thyroid disease in her son. There is no history of Colon cancer. Allergies  Allergen Reactions  . Amoxicillin Diarrhea  . Erythromycin Diarrhea  . Penicillins Diarrhea        Review of Systems: Denies symptomatic reflux. Positive for abdominal pain left middle quadrant. No fever or rectal bleeding  The remainder of the 10 point ROS is negative except as outlined in H&P   Physical Exam: General appearance  Well developed, in no distress. Eyes- non icteric. HEENT nontraumatic, normocephalic. Mouth no lesions, tongue papillated, no cheilosis. Neck supple without adenopathy, thyroid not enlarged, no carotid bruits, no JVD. Lungs Clear to auscultation bilaterally. Cor normal S1, normal S2, regular rhythm, no murmur,  quiet precordium. Abdomen: Soft with tenderness in the left middle quadrant. No rebound or fullness. Normal active bowel sounds. No  distention. Right upper quadrant and costal margin are unremarkable Rectal: Soft Hemoccult negative stool Extremities no pedal edema. Skin no lesions. Neurological alert and oriented x 3. Psychological normal mood and affect.  Assessment and Plan:  Left middle quadrant abdominal discomfort. Very mild and intermittent. Most likely irritable bowel syndrome or symptomatic diverticulosis. Also metformin may be causing diarrhea and colon irritation. She is Hemoccult negative. Last colonoscopy was 2008. She was supposed to be recalled  in 10 years which would be in 2018. She is not interested in repeating her colonoscopy at this time. I have offered Levsin sublingually .125 mg to use when necessary discomfort but she declined  Asymptomatic cholelithiasis by CT scan. She is interested in pursuing a gallbladder function especially in view of the fact that she is a diabetic and that he may need cholecystectomy just on the basis of having gall stones. We will schedule HIDA scan with CCK to assess the function of the gallbladder.  Gastroesophageal reflux disease. History of Barrett's esophagus in 2008 not reproduced on 2010 and 2012.EGD. We will refill her Nexium 40 mg twice a day  History of anal fissure. Currently not a problem  Pt declined to schedule HIDA scan, will call us.    09/17/2012 Catherine [name]

## 2012-09-17 NOTE — Patient Instructions (Addendum)
We have sent the following medications to your pharmacy for you to pick up at your convenience: Nexium twice daily  You have been scheduled for a HIDA scan at ________ (1st floor) on ________. Please arrive 15 minutes prior to your scheduled appointment on ______. Make certain not to have anything to eat or drink at least 6 hours prior to your test. Should this appointment date or time not work well for you, please call radiology scheduling at [phone].  _____________________________________________________________________ hepatobiliary (HIDA) scan is an imaging procedure used to diagnose problems in the liver, gallbladder and bile ducts. In the HIDA scan, a radioactive chemical or tracer is injected into a vein in your arm. The tracer is handled by the liver like bile. Bile is a fluid produced and excreted by your liver that helps your digestive system break down fats in the foods you eat. Bile is stored in your gallbladder and the gallbladder releases the bile when you eat a meal. A special nuclear medicine scanner (gamma camera) tracks the flow of the tracer from your liver into your gallbladder and small intestine.  During your HIDA scan  You'll be asked to change into a hospital gown before your HIDA scan begins. Your health care team will position you on a table, usually on your back. The radioactive tracer is then injected into a vein in your arm.The tracer travels through your bloodstream to your liver, where it's taken up by the bile-producing cells. The radioactive tracer travels with the bile from your liver into your gallbladder and through your bile ducts to your small intestine.You may feel some pressure while the radioactive tracer is injected into your vein. As you lie on the table, a special gamma camera is positioned over your abdomen taking pictures of the tracer as it moves through your body. The gamma camera takes pictures continually for about an hour. You'll need to keep still  during the HIDA scan. This can become uncomfortable, but you may find that you can lessen the discomfort by taking deep breaths and thinking about other things. Tell your health care team if you're uncomfortable. The radiologist will watch on a computer the progress of the radioactive tracer through your body. The HIDA scan may be stopped when the radioactive tracer is seen in the gallbladder and enters your small intestine. This typically takes about an hour. In some cases extra imaging will be performed if original images aren't satisfactory, if morphine is given to help visualize the gallbladder or if the medication CCK is given to look at the contraction of the gallbladder. This test typically takes 2 hours to complete. ________________________________________________________________________ CC: Dr Paulino Rily

## 2012-09-30 ENCOUNTER — Telehealth: Payer: Self-pay | Admitting: Internal Medicine

## 2012-09-30 NOTE — Telephone Encounter (Signed)
Patient states that she would like to cancel the HIDA scan and will "call back to reschedule it" later. I have cancelled the scan with Radiology.

## 2012-10-08 ENCOUNTER — Encounter: Payer: Self-pay | Admitting: Internal Medicine

## 2012-10-08 NOTE — Progress Notes (Signed)
Patient has switched GI care from Dr [name] to Dr Charna Elizabeth as of 10/06/12.

## 2012-10-15 ENCOUNTER — Encounter (HOSPITAL_COMMUNITY): Payer: BC Managed Care – PPO

## 2012-11-13 ENCOUNTER — Other Ambulatory Visit: Payer: Self-pay

## 2013-01-13 ENCOUNTER — Ambulatory Visit: Payer: BC Managed Care – PPO | Admitting: Obstetrics & Gynecology

## 2013-01-15 ENCOUNTER — Ambulatory Visit (INDEPENDENT_AMBULATORY_CARE_PROVIDER_SITE_OTHER): Payer: BC Managed Care – PPO | Admitting: Obstetrics & Gynecology

## 2013-01-15 ENCOUNTER — Encounter: Payer: Self-pay | Admitting: Obstetrics & Gynecology

## 2013-01-15 VITALS — BP 100/58 | HR 64 | Resp 20 | Ht 62.75 in | Wt 148.8 lb

## 2013-01-15 DIAGNOSIS — Z01419 Encounter for gynecological examination (general) (routine) without abnormal findings: Secondary | ICD-10-CM

## 2013-01-15 DIAGNOSIS — Z124 Encounter for screening for malignant neoplasm of cervix: Secondary | ICD-10-CM

## 2013-01-15 NOTE — Progress Notes (Signed)
63 y.o. [id number] Married [place] F here for annual exam.  Pt had a nice holiday.  Going to [place] to see her family for one month.    Had some issues with pain after eating.  She felt like she was seeing her metformin in her stool.  Saw Dr. Collene Mares and started her on pro-biotics.  Had 2 UTIs in the fall.  Saw Dr. Beatrix Fetters.  On antibiotic for supression.  She has follow-up in March.    No vaginal bleeding.    Sees Dr. Chalmers Cater for diabetes.  Last HbA1C was less than one month ago.  Level was good.    Saw ortho on Monday for cortisone shot in left knee.  Has had an MRI showing some inflammation.    Patient's last menstrual period was 01/08/2009. After having pelvic exam         Sexually active: yes  The current method of family planning is none.    Exercising: yes  walking Smoker:  no  Health Maintenance: Pap:  01/03/12 WNL History of abnormal Pap:  no MMG:  05/05/09 ? At [facility] Colonoscopy:  2008, Dr. Olevia Perches BMD:   05/15/06 TDaP:  PCP Kristen Cardinal Screening Labs: PCP, Hb today: PCP, Urine today: Dr Dorina Hoyer [facility]   reports that she has never smoked. She has never used smokeless tobacco. She reports that she does not drink alcohol or use illicit drugs.  Past Medical History  Diagnosis Date  . Diverticulosis     Dr Olevia Perches  . IBS (irritable bowel syndrome)   . Anal fissure   . Barrett esophagus     normal now  . Hyperlipidemia   . Asthma   . GERD (gastroesophageal reflux disease)   . Hyperplastic polyps of stomach   . Gastritis   . Osteopenia   . Hypothyroidism   . Anxiety   . Diabetes mellitus   . Cholelithiasis   . Recurrent UTI     seeing Dr Dahlsted-recent culture negative    Past Surgical History  Procedure Laterality Date  . Tubal ligation      with D&C due to Incomplete AB  . Laparoscopy N/A 05/19/2012    Procedure: LAPAROSCOPY OPERATIVE;  Surgeon: Lyman Speller, MD;  Location: [facility];  Service: Gynecology;  Laterality: N/A;  .  Salpingoophorectomy Bilateral 05/19/2012    Procedure: SALPINGO OOPHORECTOMY;  Surgeon: Lyman Speller, MD;  Location: [facility];  Service: Gynecology;  Laterality: Bilateral;  including left dermoid cyst    Current Outpatient Prescriptions  Medication Sig Dispense Refill  . clobetasol ointment (TEMOVATE) 0.05 % Apply topically 2 (two) times daily. Apply as directed twice daily  60 g  1  . esomeprazole (NEXIUM) 40 MG capsule Take 1 capsule (40 mg total) by mouth 2 (two) times daily.  60 capsule  10  . fexofenadine (ALLEGRA) 180 MG tablet Take 180 mg by mouth at bedtime as needed.      . hydrocortisone cream 1 % Apply to anal fissure as needed.  30 g  2  . levothyroxine (SYNTHROID, LEVOTHROID) 25 MCG tablet 25-50 mcg. Take 1 by mouth every day but 2 tablets on Tues and Wed      . lidocaine (XYLOCAINE) 5 % ointment Apply topically daily. Use as directed  1.25 g  0  . metFORMIN (GLUCOPHAGE) 500 MG tablet Take 500 mg by mouth daily with breakfast.       . Multiple Vitamins-Minerals (CENTRUM PO) Take 1 tablet by mouth daily.        Marland Kitchen  Nitrofurantoin Monohyd Macro (MACROBID PO) Take 50 mg by mouth daily.      . Probiotic Product (PROBIOTIC DAILY PO) Take by mouth. Flora-B daily      . simvastatin (ZOCOR) 5 MG tablet       . desoximetasone (TOPICORT) 0.25 % cream as needed.      Marland Kitchen estradiol (ESTRACE) 0.1 MG/GM vaginal cream Externally twice daily  42.5 g  6  . PROAIR HFA 108 (90 BASE) MCG/ACT inhaler as needed.       No current facility-administered medications for this visit.    Family History  Problem Relation Age of Onset  . Thyroid disease Son   . Lymphoma Brother   . Colon cancer Neg Hx   . Diabetes Sister     maternal aunt  . Hypertension Father   . Stroke Father   . Diabetes Father 90    on po meds  . Diabetes Mother 90    on insulin  . Heart disease Brother     heart stent    ROS:  Pertinent items are noted in HPI.  Otherwise, a comprehensive ROS was negative.  Exam:   BP  100/58  Pulse 64  Resp 20  Ht 5' 2.75" (1.594 m)  Wt 148 lb 12.8 oz (67.495 kg)  BMI 26.56 kg/m2  LMP 01/08/2009   Height: 5' 2.75" (159.4 cm)  Ht Readings from Last 3 Encounters:  01/15/13 5' 2.75" (1.594 m)  09/17/12 5' 2.5" (1.588 m)  05/23/12 5\' 3"  (1.6 m)    General appearance: alert, cooperative and appears stated age Head: Normocephalic, without obvious abnormality, atraumatic Neck: no adenopathy, supple, symmetrical, trachea midline and thyroid normal to inspection and palpation Lungs: clear to auscultation bilaterally Breasts: normal appearance, no masses or tenderness Heart: regular rate and rhythm Abdomen: soft, non-tender; bowel sounds normal; no masses,  no organomegaly Extremities: extremities normal, atraumatic, no cyanosis or edema Skin: Skin color, texture, turgor normal. No rashes or lesions Lymph nodes: Cervical, supraclavicular, and axillary nodes normal. No abnormal inguinal nodes palpated Neurologic: Grossly normal   Pelvic: External genitalia:  no lesions              Urethra:  normal appearing urethra with no masses, tenderness or lesions              Bartholins and Skenes: normal                 Vagina: normal appearing vagina with normal color and discharge, no lesions              Cervix: no lesions              Pap taken: yes Bimanual Exam:  Uterus:  normal size, contour, position, consistency, mobility, non-tender              Adnexa: normal adnexa and no mass, fullness, tenderness               Rectovaginal: Confirms               Anus:  normal sphincter tone, no lesions  A:  Well Woman with normal exam S/p BSO for dermoid 5/14 H/O biopsy proven lichen sclerosus Vaginal atrophic changes Diabetes--followed by Dr. Chalmers Cater Recurrent UTIs--being seen by urology  P:   Mammogram yearly.  3D discussed. pap smear only. Pt will return after trip to [place] for a tdap.   Labs/vaccines with Dr. Stephanie Acre and Dr. Chalmers Cater return annually or prn  An After  Visit Summary was printed and given to the patient.

## 2013-01-20 LAB — IPS PAP TEST WITH HPV

## 2013-01-23 NOTE — Patient Instructions (Signed)

## 2013-03-20 ENCOUNTER — Telehealth: Payer: Self-pay | Admitting: Obstetrics & Gynecology

## 2013-03-20 NOTE — Telephone Encounter (Signed)
Spoke with patient. She has recently been treated for a UTI and was seen by urology. She states that the Urologist recommended her to use otc Luvena. Patient would like Dr. Ammie Ferrier opinion on [place] and if this may aggravate her Vulvitis that has been recently under control. She does want to use Luvena if it may cause vulvitis again.   I advised that I would send a message to Dr. Sabra Heck but that she was out of the office today. Patient verbalizes that she is okay to wait for message back from Dr. Sabra Heck.

## 2013-03-20 NOTE — Telephone Encounter (Signed)
Patient has a medical question would not give any more information.

## 2013-03-24 NOTE — Telephone Encounter (Signed)
Catherine [name] is an OTC vaginal moisturizer so not sure why the urologist would recommend this when her issue was a UTI.  Product does not give all its ingredients on website so i can't be sure it wont cause her new issues.  Does she know why he recommended it?

## 2013-03-25 NOTE — Telephone Encounter (Signed)
I agree with Catherine [name].  It took a while to get her vulvar irritation under control and I don't really want her to use anything that might start a new issue.  I think I would just treat the UTIs.  Did the urologist suggest a "suppressive antibiotic"?  If so, that really should be enough.  Can close encounter if no additional questions.

## 2013-03-25 NOTE — Telephone Encounter (Signed)
Spoke with patient and she was given message from Dr. Sabra Heck. Patient has completed 3 month course of Macrobid.  She will wait on the Luvena and "see what happens".   Routing to provider for final review. Patient agreeable to disposition. Will close encounter

## 2013-03-25 NOTE — Telephone Encounter (Signed)
Spoke with patient. Still having concerns about using Luvena. Stating that urologist recommended Luvena as a probiotic for vagina as patient states she has been getting E. Coli. UTIs and this product may help. Advised patient would call patient back with advice from Dr. Sabra Heck. Tried to call company with no answer they are on [place].

## 2013-07-09 ENCOUNTER — Telehealth: Payer: Self-pay | Admitting: Obstetrics & Gynecology

## 2013-07-09 NOTE — Telephone Encounter (Signed)
Patient has a question regarding her a bill she just received. Patient thinks her claim was filed with the wrong insurance. Patient says the bill says her claim was filed with [employer] [place]. 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

## 2013-10-19 ENCOUNTER — Encounter: Payer: Self-pay | Admitting: Internal Medicine

## 2013-10-20 DIAGNOSIS — M25511 Pain in right shoulder: Secondary | ICD-10-CM | POA: Diagnosis not present

## 2013-10-20 DIAGNOSIS — M47812 Spondylosis without myelopathy or radiculopathy, cervical region: Secondary | ICD-10-CM | POA: Diagnosis not present

## 2013-10-26 ENCOUNTER — Telehealth: Payer: Self-pay | Admitting: Obstetrics & Gynecology

## 2013-10-26 NOTE — Telephone Encounter (Signed)
Order for BMD and screening mammogram to Dr.Miller's desk for review and signature before fax.

## 2013-10-26 NOTE — Telephone Encounter (Signed)
Signed and back on your desk.  Encounter closed.  Thanks.

## 2013-10-26 NOTE — Telephone Encounter (Signed)
Spoke with patient. Advised order for BMD and mammogram sent to [facility]. Patient is agreeable and states that she is scheduled for 10/11 at 11:30am.   Encounter previously closed.

## 2013-10-26 NOTE — Telephone Encounter (Signed)
Pt is calling asking for orders for bone density and mammogram to go to [facility].

## 2013-11-06 DIAGNOSIS — M47892 Other spondylosis, cervical region: Secondary | ICD-10-CM | POA: Diagnosis not present

## 2013-11-09 ENCOUNTER — Encounter: Payer: Self-pay | Admitting: Obstetrics & Gynecology

## 2013-11-09 DIAGNOSIS — L818 Other specified disorders of pigmentation: Secondary | ICD-10-CM | POA: Diagnosis not present

## 2013-11-11 DIAGNOSIS — M47892 Other spondylosis, cervical region: Secondary | ICD-10-CM | POA: Diagnosis not present

## 2013-11-13 DIAGNOSIS — M47892 Other spondylosis, cervical region: Secondary | ICD-10-CM | POA: Diagnosis not present

## 2013-11-16 DIAGNOSIS — M47892 Other spondylosis, cervical region: Secondary | ICD-10-CM | POA: Diagnosis not present

## 2013-11-17 DIAGNOSIS — M8580 Other specified disorders of bone density and structure, unspecified site: Secondary | ICD-10-CM | POA: Diagnosis not present

## 2013-11-17 DIAGNOSIS — Z1231 Encounter for screening mammogram for malignant neoplasm of breast: Secondary | ICD-10-CM | POA: Diagnosis not present

## 2013-11-19 DIAGNOSIS — M47892 Other spondylosis, cervical region: Secondary | ICD-10-CM | POA: Diagnosis not present

## 2013-11-24 DIAGNOSIS — E780 Pure hypercholesterolemia: Secondary | ICD-10-CM | POA: Diagnosis not present

## 2013-11-24 DIAGNOSIS — E039 Hypothyroidism, unspecified: Secondary | ICD-10-CM | POA: Diagnosis not present

## 2013-11-24 DIAGNOSIS — E1165 Type 2 diabetes mellitus with hyperglycemia: Secondary | ICD-10-CM | POA: Diagnosis not present

## 2013-12-02 DIAGNOSIS — E1165 Type 2 diabetes mellitus with hyperglycemia: Secondary | ICD-10-CM | POA: Diagnosis not present

## 2013-12-02 DIAGNOSIS — E780 Pure hypercholesterolemia: Secondary | ICD-10-CM | POA: Diagnosis not present

## 2013-12-02 DIAGNOSIS — E039 Hypothyroidism, unspecified: Secondary | ICD-10-CM | POA: Diagnosis not present

## 2013-12-08 ENCOUNTER — Telehealth: Payer: Self-pay

## 2013-12-08 NOTE — Telephone Encounter (Signed)
Patient notified of BMD results from [facility]. Copy will be scanned into epic.//kn

## 2013-12-09 DIAGNOSIS — M47892 Other spondylosis, cervical region: Secondary | ICD-10-CM | POA: Diagnosis not present

## 2013-12-11 DIAGNOSIS — M47892 Other spondylosis, cervical region: Secondary | ICD-10-CM | POA: Diagnosis not present

## 2013-12-22 ENCOUNTER — Encounter: Payer: Self-pay | Admitting: Obstetrics & Gynecology

## 2013-12-28 DIAGNOSIS — M7541 Impingement syndrome of right shoulder: Secondary | ICD-10-CM | POA: Diagnosis not present

## 2014-01-18 DIAGNOSIS — L509 Urticaria, unspecified: Secondary | ICD-10-CM | POA: Diagnosis not present

## 2014-01-25 DIAGNOSIS — H40013 Open angle with borderline findings, low risk, bilateral: Secondary | ICD-10-CM | POA: Diagnosis not present

## 2014-01-25 DIAGNOSIS — H2513 Age-related nuclear cataract, bilateral: Secondary | ICD-10-CM | POA: Diagnosis not present

## 2014-01-25 DIAGNOSIS — H35413 Lattice degeneration of retina, bilateral: Secondary | ICD-10-CM | POA: Diagnosis not present

## 2014-01-25 DIAGNOSIS — E119 Type 2 diabetes mellitus without complications: Secondary | ICD-10-CM | POA: Diagnosis not present

## 2014-02-05 ENCOUNTER — Other Ambulatory Visit: Payer: Self-pay | Admitting: Obstetrics & Gynecology

## 2014-02-05 DIAGNOSIS — N3000 Acute cystitis without hematuria: Secondary | ICD-10-CM | POA: Diagnosis not present

## 2014-02-05 NOTE — Telephone Encounter (Signed)
Medication refill request: Clobetasol ointment  Last AEX:  01/2013 Next AEX: 07/02/14 Last MMG (if hormonal medication request): 11/17/13 [id number]:Neg Refill authorized: 05/05/12 #60g/ 1 R. Today #60g/1R?  Called patient to confirm cream. Patient requesting Clobetasol refill.  - Asking  Dr. Sabra Heck to please see her for her AEX before April because she is traveling overseas from 04[place]15/14 - 05/17/14. Patient really persistent stating she thought she had an appt in january but when she called her appt was in May and she can't make it because she is traveling so she had to reschedule appt for June.    She also has questions about premarin and estrace cream she states she had frequent urine infections and is using premarin prescribed by urologist. She states it helps her with infections but sometimes it aggravates her vulvitis.  Offered OV with Dr. Sabra Heck stating it won't be AEX it would be a problem visit, but we can see her before April if she wants to. - Patient states she wants me to send message to Dr. Sabra Heck to see if she would see pt for AEX instead of OV.    Inform pt we will call her Monday with Dr. Ammie Ferrier response. - Patient appreciative and verbalized understanding.

## 2014-02-05 NOTE — Telephone Encounter (Signed)
Patient is asking for refill of "cream" that Dr.Miller prescribed. Patient cannot remember the name of this prescription. Patient would like to talk to a nurse when this has ben called in. Confirmed pharmacy on file.

## 2014-02-09 MED ORDER — CLOBETASOL PROPIONATE 0.05 % EX OINT: TOPICAL_OINTMENT | Freq: Two times a day (BID) | CUTANEOUS | Status: DC

## 2014-02-09 NOTE — Telephone Encounter (Signed)
RF authorized.  Pt's appt was rescheduled.  If there is an open AEX, please put her in it.  O/w will need to be OV.

## 2014-02-11 NOTE — Telephone Encounter (Signed)
Scheduled patient for AEX 02/15/14 at 12:45 pm with Dr. Sabra Heck. Aware that rx has been sent, patient appreciative.

## 2014-02-15 ENCOUNTER — Encounter: Payer: Self-pay | Admitting: Obstetrics & Gynecology

## 2014-02-15 ENCOUNTER — Ambulatory Visit (INDEPENDENT_AMBULATORY_CARE_PROVIDER_SITE_OTHER): Payer: Medicare Other | Admitting: Obstetrics & Gynecology

## 2014-02-15 VITALS — BP 108/72 | HR 64 | Resp 16 | Ht 62.5 in | Wt 157.4 lb

## 2014-02-15 DIAGNOSIS — Z01419 Encounter for gynecological examination (general) (routine) without abnormal findings: Secondary | ICD-10-CM

## 2014-02-15 MED ORDER — CLOBETASOL PROPIONATE 0.05 % EX OINT: TOPICAL_OINTMENT | Freq: Two times a day (BID) | CUTANEOUS | Status: DC

## 2014-02-15 MED ORDER — ESTRADIOL 0.1 MG/GM VA CREA: TOPICAL_CREAM | VAGINAL | Status: DC

## 2014-02-15 NOTE — Progress Notes (Signed)
64 y.o. [id number] Married IndianF here for annual exam.  Doing well.  No vaginal bleeding.  Pr reports she saw Dr. Dorina Hoyer.  She was having recurrent UTIs.  He recommended using a small amount of external Premarin cream.  She reports no UTIs for six months.    PCP:  Dr. Darcus Austin.  Appt scheduled for February.    Patient's last menstrual period was 01/08/2009.          Sexually active: Yes.    The current method of family planning is post menopausal status., BTL, and Salpingo oophorectomy   Exercising: Yes.    walking Smoker:  no  Health Maintenance: Pap:  01/15/13 WNL/negative HR HPV History of abnormal Pap:  no MMG:  11/17/13 3D-normal Colonoscopy:  2008 with Dr Fabian Sharp colpo/endo with Dr Collene Mares on 03/24/14 BMD:   11/17/13-worsening, but no treatment needed, -2.4 worse score  TDaP:  Will check with PCP next month Screening Labs: PCP, Hb today: PCP, Urine today: PCP   reports that she has never smoked. She has never used smokeless tobacco. She reports that she does not drink alcohol or use illicit drugs.  Past Medical History  Diagnosis Date  . Diverticulosis     Dr Olevia Perches  . IBS (irritable bowel syndrome)   . Anal fissure   . Barrett esophagus     normal now  . Hyperlipidemia   . Asthma   . GERD (gastroesophageal reflux disease)   . Hyperplastic polyps of stomach   . Gastritis   . Osteopenia   . Hypothyroidism   . Anxiety   . Diabetes mellitus   . Cholelithiasis   . Recurrent UTI     seeing Dr Dahlsted-recent culture negative    Past Surgical History  Procedure Laterality Date  . Tubal ligation      with D&C due to Incomplete AB  . Laparoscopy N/A 05/19/2012    Procedure: LAPAROSCOPY OPERATIVE;  Surgeon: Lyman Speller, MD;  Location: [facility];  Service: Gynecology;  Laterality: N/A;  . Salpingoophorectomy Bilateral 05/19/2012    Procedure: SALPINGO OOPHORECTOMY;  Surgeon: Lyman Speller, MD;  Location: [facility];  Service: Gynecology;  Laterality: Bilateral;   including left dermoid cyst    Current Outpatient Prescriptions  Medication Sig Dispense Refill  . clobetasol ointment (TEMOVATE) 0.05 % Apply topically 2 (two) times daily. Apply as directed twice daily 60 g 1  . desoximetasone (TOPICORT) 0.25 % cream as needed.    Marland Kitchen esomeprazole (NEXIUM) 40 MG capsule Take 1 capsule (40 mg total) by mouth 2 (two) times daily. 60 capsule 10  . fexofenadine (ALLEGRA) 180 MG tablet Take 180 mg by mouth at bedtime as needed.    . hydrocortisone cream 1 % Apply to anal fissure as needed. 30 g 2  . IRON PO Take 65 mg by mouth daily.    Marland Kitchen levothyroxine (SYNTHROID, LEVOTHROID) 25 MCG tablet 25-50 mcg. Take 1 by mouth every day but 2 tablets on Tues and Wed    . lidocaine (XYLOCAINE) 5 % ointment Apply topically daily. Use as directed 1.25 g 0  . metFORMIN (GLUCOPHAGE) 500 MG tablet Take 500 mg by mouth daily with breakfast.     . Multiple Vitamins-Minerals (CENTRUM PO) Take 1 tablet by mouth daily.      Marland Kitchen PREMARIN vaginal cream See admin instructions.  11  . PROAIR HFA 108 (90 BASE) MCG/ACT inhaler as needed.    . Probiotic Product (PROBIOTIC DAILY PO) Take by mouth. Flora-B daily    .  simvastatin (ZOCOR) 5 MG tablet     . estradiol (ESTRACE) 0.1 MG/GM vaginal cream Externally twice daily (Patient not taking: Reported on 02/15/2014) 42.5 g 6   No current facility-administered medications for this visit.    Family History  Problem Relation Age of Onset  . Thyroid disease Son   . Lymphoma Brother   . Colon cancer Neg Hx   . Diabetes Sister     maternal aunt  . Hypertension Father   . Stroke Father   . Diabetes Father 90    on po meds  . Diabetes Mother 90    on insulin  . Heart disease Brother     heart stent    ROS:  Pertinent items are noted in HPI.  Otherwise, a comprehensive ROS was negative.  Exam:   BP 108/72 mmHg  Pulse 64  Resp 16  Ht 5' 2.5" (1.588 m)  Wt 157 lb 6.4 oz (71.396 kg)  BMI 28.31 kg/m2  LMP 01/08/2009  Weight change: +8#   Height: 5' 2.5" (158.8 cm)  Ht Readings from Last 3 Encounters:  02/15/14 5' 2.5" (1.588 m)  01/15/13 5' 2.75" (1.594 m)  09/17/12 5' 2.5" (1.588 m)    General appearance: alert, cooperative and appears stated age Head: Normocephalic, without obvious abnormality, atraumatic Neck: no adenopathy, supple, symmetrical, trachea midline and thyroid normal to inspection and palpation Lungs: clear to auscultation bilaterally Breasts: normal appearance, no masses or tenderness Heart: regular rate and rhythm Abdomen: soft, non-tender; bowel sounds normal; no masses,  no organomegaly Extremities: extremities normal, atraumatic, no cyanosis or edema Skin: Skin color, texture, turgor normal. No rashes or lesions Lymph nodes: Cervical, supraclavicular, and axillary nodes normal. No abnormal inguinal nodes palpated Neurologic: Grossly normal   Pelvic: External genitalia:  no lesions              Urethra:  normal appearing urethra with no masses, tenderness or lesions              Bartholins and Skenes: normal                 Vagina: normal appearing vagina with normal color and discharge, no lesions              Cervix: no lesions              Pap taken: No. Bimanual Exam:  Uterus:  normal size, contour, position, consistency, mobility, non-tender              Adnexa: normal adnexa and no mass, fullness, tenderness               Rectovaginal: Confirms               Anus:  normal sphincter tone, no lesions  Chaperone was present for exam.  A:  Well Woman with normal exam S/p BSO for dermoid 5/14 H/O biopsy proven lichen sclerosus Vaginal atrophic changes  Diabetes--followed by Dr. Chalmers Cater.  Last HbA1c 5.9.   Recurrent UTIs--being seen by urology.  Using topical estrogen cream 3 times weekly.  No UTI in last 1 year.   P: Mammogram yearly. 3D discussed. pap smear 2015.  No Pap today. Pt thinks her Tdap is due.  She will get this with Dr. Inda Merlin.  Labs/vaccines with Dr. Inda Merlin and Dr.  Chalmers Cater return annually or prn

## 2014-02-15 NOTE — Addendum Note (Signed)
Addended by: Megan Salon on: 02/15/2014 01:58 PM   Modules accepted: Miquel Dunn

## 2014-03-04 DIAGNOSIS — E119 Type 2 diabetes mellitus without complications: Secondary | ICD-10-CM | POA: Diagnosis not present

## 2014-03-04 DIAGNOSIS — E780 Pure hypercholesterolemia: Secondary | ICD-10-CM | POA: Diagnosis not present

## 2014-03-04 DIAGNOSIS — D509 Iron deficiency anemia, unspecified: Secondary | ICD-10-CM | POA: Diagnosis not present

## 2014-03-08 DIAGNOSIS — E780 Pure hypercholesterolemia: Secondary | ICD-10-CM | POA: Diagnosis not present

## 2014-03-08 DIAGNOSIS — J45909 Unspecified asthma, uncomplicated: Secondary | ICD-10-CM | POA: Diagnosis not present

## 2014-03-08 DIAGNOSIS — Z23 Encounter for immunization: Secondary | ICD-10-CM | POA: Diagnosis not present

## 2014-03-08 DIAGNOSIS — Z0001 Encounter for general adult medical examination with abnormal findings: Secondary | ICD-10-CM | POA: Diagnosis not present

## 2014-03-08 DIAGNOSIS — E119 Type 2 diabetes mellitus without complications: Secondary | ICD-10-CM | POA: Diagnosis not present

## 2014-03-08 DIAGNOSIS — K22719 Barrett's esophagus with dysplasia, unspecified: Secondary | ICD-10-CM | POA: Diagnosis not present

## 2014-03-08 DIAGNOSIS — J301 Allergic rhinitis due to pollen: Secondary | ICD-10-CM | POA: Diagnosis not present

## 2014-03-24 DIAGNOSIS — K5730 Diverticulosis of large intestine without perforation or abscess without bleeding: Secondary | ICD-10-CM | POA: Diagnosis not present

## 2014-03-24 DIAGNOSIS — K635 Polyp of colon: Secondary | ICD-10-CM | POA: Diagnosis not present

## 2014-03-24 DIAGNOSIS — K219 Gastro-esophageal reflux disease without esophagitis: Secondary | ICD-10-CM | POA: Diagnosis not present

## 2014-03-24 DIAGNOSIS — K317 Polyp of stomach and duodenum: Secondary | ICD-10-CM | POA: Diagnosis not present

## 2014-03-24 DIAGNOSIS — K6389 Other specified diseases of intestine: Secondary | ICD-10-CM | POA: Diagnosis not present

## 2014-03-24 DIAGNOSIS — D128 Benign neoplasm of rectum: Secondary | ICD-10-CM | POA: Diagnosis not present

## 2014-03-24 DIAGNOSIS — D509 Iron deficiency anemia, unspecified: Secondary | ICD-10-CM | POA: Diagnosis not present

## 2014-03-24 DIAGNOSIS — K2970 Gastritis, unspecified, without bleeding: Secondary | ICD-10-CM | POA: Diagnosis not present

## 2014-03-24 DIAGNOSIS — Z1211 Encounter for screening for malignant neoplasm of colon: Secondary | ICD-10-CM | POA: Diagnosis not present

## 2014-04-08 DIAGNOSIS — K219 Gastro-esophageal reflux disease without esophagitis: Secondary | ICD-10-CM | POA: Diagnosis not present

## 2014-04-08 DIAGNOSIS — D131 Benign neoplasm of stomach: Secondary | ICD-10-CM | POA: Diagnosis not present

## 2014-04-08 DIAGNOSIS — Z86010 Personal history of colonic polyps: Secondary | ICD-10-CM | POA: Diagnosis not present

## 2014-05-11 ENCOUNTER — Ambulatory Visit: Payer: BC Managed Care – PPO | Admitting: Obstetrics & Gynecology

## 2014-05-31 DIAGNOSIS — H25043 Posterior subcapsular polar age-related cataract, bilateral: Secondary | ICD-10-CM | POA: Diagnosis not present

## 2014-05-31 DIAGNOSIS — E119 Type 2 diabetes mellitus without complications: Secondary | ICD-10-CM | POA: Diagnosis not present

## 2014-05-31 DIAGNOSIS — H25013 Cortical age-related cataract, bilateral: Secondary | ICD-10-CM | POA: Diagnosis not present

## 2014-05-31 DIAGNOSIS — H35413 Lattice degeneration of retina, bilateral: Secondary | ICD-10-CM | POA: Diagnosis not present

## 2014-05-31 DIAGNOSIS — H2513 Age-related nuclear cataract, bilateral: Secondary | ICD-10-CM | POA: Diagnosis not present

## 2014-06-09 DIAGNOSIS — H35413 Lattice degeneration of retina, bilateral: Secondary | ICD-10-CM | POA: Diagnosis not present

## 2014-06-09 DIAGNOSIS — H25013 Cortical age-related cataract, bilateral: Secondary | ICD-10-CM | POA: Diagnosis not present

## 2014-06-09 DIAGNOSIS — H25043 Posterior subcapsular polar age-related cataract, bilateral: Secondary | ICD-10-CM | POA: Diagnosis not present

## 2014-06-09 DIAGNOSIS — H2513 Age-related nuclear cataract, bilateral: Secondary | ICD-10-CM | POA: Diagnosis not present

## 2014-06-10 DIAGNOSIS — E039 Hypothyroidism, unspecified: Secondary | ICD-10-CM | POA: Diagnosis not present

## 2014-06-10 DIAGNOSIS — E1165 Type 2 diabetes mellitus with hyperglycemia: Secondary | ICD-10-CM | POA: Diagnosis not present

## 2014-06-10 DIAGNOSIS — E780 Pure hypercholesterolemia: Secondary | ICD-10-CM | POA: Diagnosis not present

## 2014-06-15 DIAGNOSIS — E780 Pure hypercholesterolemia: Secondary | ICD-10-CM | POA: Diagnosis not present

## 2014-06-15 DIAGNOSIS — E1165 Type 2 diabetes mellitus with hyperglycemia: Secondary | ICD-10-CM | POA: Diagnosis not present

## 2014-06-15 DIAGNOSIS — D649 Anemia, unspecified: Secondary | ICD-10-CM | POA: Diagnosis not present

## 2014-06-15 DIAGNOSIS — E039 Hypothyroidism, unspecified: Secondary | ICD-10-CM | POA: Diagnosis not present

## 2014-06-21 ENCOUNTER — Encounter (INDEPENDENT_AMBULATORY_CARE_PROVIDER_SITE_OTHER): Payer: Medicare Other | Admitting: Ophthalmology

## 2014-06-21 DIAGNOSIS — H3531 Nonexudative age-related macular degeneration: Secondary | ICD-10-CM

## 2014-06-21 DIAGNOSIS — H33302 Unspecified retinal break, left eye: Secondary | ICD-10-CM | POA: Diagnosis not present

## 2014-06-21 DIAGNOSIS — H35412 Lattice degeneration of retina, left eye: Secondary | ICD-10-CM | POA: Diagnosis not present

## 2014-06-21 DIAGNOSIS — H2513 Age-related nuclear cataract, bilateral: Secondary | ICD-10-CM | POA: Diagnosis not present

## 2014-06-21 DIAGNOSIS — H43813 Vitreous degeneration, bilateral: Secondary | ICD-10-CM | POA: Diagnosis not present

## 2014-07-01 ENCOUNTER — Ambulatory Visit (INDEPENDENT_AMBULATORY_CARE_PROVIDER_SITE_OTHER): Payer: Medicare Other | Admitting: Ophthalmology

## 2014-07-02 ENCOUNTER — Ambulatory Visit: Payer: BC Managed Care – PPO | Admitting: Obstetrics & Gynecology

## 2014-07-05 ENCOUNTER — Other Ambulatory Visit: Payer: Self-pay

## 2014-07-05 ENCOUNTER — Ambulatory Visit (INDEPENDENT_AMBULATORY_CARE_PROVIDER_SITE_OTHER): Payer: Medicare Other | Admitting: Ophthalmology

## 2014-07-05 DIAGNOSIS — J45909 Unspecified asthma, uncomplicated: Secondary | ICD-10-CM | POA: Diagnosis not present

## 2014-07-05 DIAGNOSIS — H33302 Unspecified retinal break, left eye: Secondary | ICD-10-CM

## 2014-07-05 DIAGNOSIS — J301 Allergic rhinitis due to pollen: Secondary | ICD-10-CM | POA: Diagnosis not present

## 2014-07-05 DIAGNOSIS — J069 Acute upper respiratory infection, unspecified: Secondary | ICD-10-CM | POA: Diagnosis not present

## 2014-08-03 DIAGNOSIS — E611 Iron deficiency: Secondary | ICD-10-CM | POA: Diagnosis not present

## 2014-08-03 DIAGNOSIS — R42 Dizziness and giddiness: Secondary | ICD-10-CM | POA: Diagnosis not present

## 2014-08-17 DIAGNOSIS — H2511 Age-related nuclear cataract, right eye: Secondary | ICD-10-CM | POA: Diagnosis not present

## 2014-09-17 DIAGNOSIS — E039 Hypothyroidism, unspecified: Secondary | ICD-10-CM | POA: Diagnosis not present

## 2014-09-22 DIAGNOSIS — H25042 Posterior subcapsular polar age-related cataract, left eye: Secondary | ICD-10-CM | POA: Diagnosis not present

## 2014-09-22 DIAGNOSIS — H25012 Cortical age-related cataract, left eye: Secondary | ICD-10-CM | POA: Diagnosis not present

## 2014-09-22 DIAGNOSIS — H2512 Age-related nuclear cataract, left eye: Secondary | ICD-10-CM | POA: Diagnosis not present

## 2014-09-28 DIAGNOSIS — H2512 Age-related nuclear cataract, left eye: Secondary | ICD-10-CM | POA: Diagnosis not present

## 2014-10-19 DIAGNOSIS — Z23 Encounter for immunization: Secondary | ICD-10-CM | POA: Diagnosis not present

## 2014-11-11 ENCOUNTER — Ambulatory Visit (INDEPENDENT_AMBULATORY_CARE_PROVIDER_SITE_OTHER): Payer: Medicare Other | Admitting: Ophthalmology

## 2014-11-11 DIAGNOSIS — H353132 Nonexudative age-related macular degeneration, bilateral, intermediate dry stage: Secondary | ICD-10-CM | POA: Diagnosis not present

## 2014-11-11 DIAGNOSIS — H33302 Unspecified retinal break, left eye: Secondary | ICD-10-CM

## 2014-11-11 DIAGNOSIS — H43813 Vitreous degeneration, bilateral: Secondary | ICD-10-CM

## 2014-12-16 DIAGNOSIS — E039 Hypothyroidism, unspecified: Secondary | ICD-10-CM | POA: Diagnosis not present

## 2014-12-16 DIAGNOSIS — E7800 Pure hypercholesterolemia, unspecified: Secondary | ICD-10-CM | POA: Diagnosis not present

## 2014-12-16 DIAGNOSIS — E1165 Type 2 diabetes mellitus with hyperglycemia: Secondary | ICD-10-CM | POA: Diagnosis not present

## 2014-12-16 DIAGNOSIS — D649 Anemia, unspecified: Secondary | ICD-10-CM | POA: Diagnosis not present

## 2014-12-23 DIAGNOSIS — E039 Hypothyroidism, unspecified: Secondary | ICD-10-CM | POA: Diagnosis not present

## 2014-12-23 DIAGNOSIS — E1165 Type 2 diabetes mellitus with hyperglycemia: Secondary | ICD-10-CM | POA: Diagnosis not present

## 2014-12-23 DIAGNOSIS — E7800 Pure hypercholesterolemia, unspecified: Secondary | ICD-10-CM | POA: Diagnosis not present

## 2014-12-23 DIAGNOSIS — J069 Acute upper respiratory infection, unspecified: Secondary | ICD-10-CM | POA: Diagnosis not present

## 2015-01-18 DIAGNOSIS — J45909 Unspecified asthma, uncomplicated: Secondary | ICD-10-CM | POA: Diagnosis not present

## 2015-02-10 DIAGNOSIS — R141 Gas pain: Secondary | ICD-10-CM | POA: Diagnosis not present

## 2015-02-10 DIAGNOSIS — K219 Gastro-esophageal reflux disease without esophagitis: Secondary | ICD-10-CM | POA: Diagnosis not present

## 2015-02-10 DIAGNOSIS — R110 Nausea: Secondary | ICD-10-CM | POA: Diagnosis not present

## 2015-03-02 DIAGNOSIS — K219 Gastro-esophageal reflux disease without esophagitis: Secondary | ICD-10-CM | POA: Diagnosis not present

## 2015-03-02 DIAGNOSIS — R1013 Epigastric pain: Secondary | ICD-10-CM | POA: Diagnosis not present

## 2015-03-02 DIAGNOSIS — K317 Polyp of stomach and duodenum: Secondary | ICD-10-CM | POA: Diagnosis not present

## 2015-03-04 ENCOUNTER — Ambulatory Visit (INDEPENDENT_AMBULATORY_CARE_PROVIDER_SITE_OTHER): Payer: Medicare Other | Admitting: Obstetrics & Gynecology

## 2015-03-04 ENCOUNTER — Encounter: Payer: Self-pay | Admitting: Obstetrics & Gynecology

## 2015-03-04 VITALS — BP 102/66 | HR 82 | Resp 14 | Ht 62.5 in | Wt 161.0 lb

## 2015-03-04 DIAGNOSIS — Z124 Encounter for screening for malignant neoplasm of cervix: Secondary | ICD-10-CM

## 2015-03-04 DIAGNOSIS — Z01419 Encounter for gynecological examination (general) (routine) without abnormal findings: Secondary | ICD-10-CM

## 2015-03-04 MED ORDER — CLOBETASOL PROPIONATE 0.05 % EX OINT: TOPICAL_OINTMENT | Freq: Two times a day (BID) | CUTANEOUS | Status: DC

## 2015-03-04 MED ORDER — ESTRADIOL 0.1 MG/GM VA CREA: TOPICAL_CREAM | VAGINAL | Status: DC

## 2015-03-04 NOTE — Progress Notes (Signed)
64 y.o. [id number] Married [place] F here for annual exam.  Doing well.  No vaginal bleeding.  Pt has seen Dr. Chalmers Cater.  Appt was in December.    Pt did have cataracts removed last year with Dr. Herbert Deaner.  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  Saw retinal specialist, Dr. Zigmund Daniel, and has laser treatment.  Has follow-up in six months.  Pt reports vision is really good.  Denies vaginal bleeding.  Has not had a UTI this past year.  Using vaginal estrogen cream three times weekly.    Patient's last menstrual period was 01/08/2009.          Sexually active: Yes.    The current method of family planning is post menopausal status.    Exercising: Yes.    Walk Smoker:  no  Health Maintenance: Pap:  01/15/13 Neg. HR HPV:Neg History of abnormal Pap:  no MMG: 11/17/13 BIRADS1:neg.  Has decreased to every other year.   Colonoscopy:  3/16.  Negative.  Dr. Collene Mares. BMD:  11/17/13 osteopenia  TDaP:  PCP Pneumonia vaccine:  2016 Screening Labs: Endocrinology, Urine today: PCP   reports that she has never smoked. She has never used smokeless tobacco. She reports that she does not drink alcohol or use illicit drugs.  Past Medical History  Diagnosis Date  . Diverticulosis     Dr Olevia Perches  . IBS (irritable bowel syndrome)   . Anal fissure   . Barrett esophagus     normal now  . Hyperlipidemia   . Asthma   . GERD (gastroesophageal reflux disease)   . Hyperplastic polyps of stomach   . Gastritis   . Osteopenia   . Hypothyroidism   . Anxiety   . Diabetes mellitus   . Cholelithiasis   . Recurrent UTI     seeing Dr Dahlsted-recent culture negative    Past Surgical History  Procedure Laterality Date  . Tubal ligation      with D&C due to Incomplete AB  . Laparoscopy N/A 05/19/2012    Procedure: LAPAROSCOPY OPERATIVE;  Surgeon: Lyman Speller, MD;  Location: [facility];  Service: Gynecology;  Laterality: N/A;  . Salpingoophorectomy Bilateral 05/19/2012    Procedure: SALPINGO OOPHORECTOMY;  Surgeon: Lyman Speller, MD;  [facility];  Service: Gynecology;  Laterality: Bilateral;  including left dermoid cyst    Current Outpatient Prescriptions  Medication Sig Dispense Refill  . clobetasol ointment (TEMOVATE) 0.05 % Apply topically 2 (two) times daily. Apply as directed twice daily 60 g 3  . desoximetasone (TOPICORT) 0.25 % cream as needed.    Marland Kitchen esomeprazole (NEXIUM) 40 MG capsule Take 1 capsule (40 mg total) by mouth 2 (two) times daily. (Patient taking differently: Take 40 mg by mouth daily. ) 60 capsule 10  . estradiol (ESTRACE) 0.1 MG/GM vaginal cream Use pea sized amount of cream externally two to three times weekly 42.5 g 3  . fexofenadine (ALLEGRA) 180 MG tablet Take 180 mg by mouth at bedtime as needed.    Marland Kitchen levothyroxine (SYNTHROID, LEVOTHROID) 25 MCG tablet 25-50 mcg. Take 1 by mouth every day but 2 tablets on Tues and Wed    . lidocaine (XYLOCAINE) 5 % ointment Apply topically daily. Use as directed 1.25 g 0  . metFORMIN (GLUCOPHAGE) 500 MG tablet Take 500 mg by mouth daily with breakfast.     . Multiple Vitamins-Minerals (CENTRUM PO) Take 1 tablet by mouth daily.      Marland Kitchen PROAIR HFA 108 (90 BASE) MCG/ACT  inhaler as needed.    . Probiotic Product (PROBIOTIC DAILY PO) Take by mouth. Flora-B daily    . simvastatin (ZOCOR) 5 MG tablet Take 5 mg by mouth at bedtime.      No current facility-administered medications for this visit.    Family History  Problem Relation Age of Onset  . Thyroid disease Son   . Lymphoma Brother   . Colon cancer Neg Hx   . Diabetes Sister     maternal aunt  . Hypertension Father   . Stroke Father   . Diabetes Father 90    on po meds  . Diabetes Mother 90    on insulin  . Heart disease Brother     heart stent    ROS:  Pertinent items are noted in HPI.  Otherwise, a comprehensive ROS was negative.  Exam:   BP 102/66 mmHg  Pulse 82  Resp 14  Ht 5' 2.5" (1.588 m)  Wt 161 lb (73.029 kg)  BMI 28.96 kg/m2  LMP 01/08/2009  Weight change:  +4#   Height: 5' 2.5" (158.8 cm)  Ht Readings from Last 3 Encounters:  03/04/15 5' 2.5" (1.588 m)  02/15/14 5' 2.5" (1.588 m)  01/15/13 5' 2.75" (1.594 m)    General appearance: alert, cooperative and appears stated age Head: Normocephalic, without obvious abnormality, atraumatic Neck: no adenopathy, supple, symmetrical, trachea midline and thyroid normal to inspection and palpation Lungs: clear to auscultation bilaterally Breasts: normal appearance, no masses or tenderness Heart: regular rate and rhythm Abdomen: soft, non-tender; bowel sounds normal; no masses,  no organomegaly Extremities: extremities normal, atraumatic, no cyanosis or edema Skin: Skin color, texture, turgor normal. No rashes or lesions Lymph nodes: Cervical, supraclavicular, and axillary nodes normal. No abnormal inguinal nodes palpated Neurologic: Grossly normal   Pelvic: External genitalia:  no lesions              Urethra:  normal appearing urethra with no masses, tenderness or lesions              Bartholins and Skenes: normal                 Vagina: normal appearing vagina with normal color and discharge, no lesions              Cervix: no lesions              Pap taken: Yes.   Bimanual Exam:  Uterus:  normal size, contour, position, consistency, mobility, non-tender              Adnexa: normal adnexa and no mass, fullness, tenderness               Rectovaginal: Confirms               Anus:  normal sphincter tone, no lesions  Chaperone was present for exam.  A:  Well Woman with normal exam S/p BSO for dermoid 5/14 H/O biopsy proven lichen sclerosus Vaginal atrophic changes  Diabetes--followed by Dr. Chalmers Cater. Last HbA1c 6.1.  Recurrent UTIs.  Has been followed Dr. Dorina Hoyer.  Now on prn follow-up.  he recommended to continue using the vaginal estrogen cream.   P: Mammogram yearly. 3D discussed. pap smear 2015. Pap today. Labs/vaccines with Dr. Inda Merlin and Dr. Chalmers Cater RF for Estrace 1 grm pv  three times weekly and Clobetasol 0.05% ointment prn.  (pt uses less than once weekly) return annually or prn

## 2015-03-07 LAB — IPS PAP SMEAR ONLY

## 2015-03-30 DIAGNOSIS — L309 Dermatitis, unspecified: Secondary | ICD-10-CM | POA: Diagnosis not present

## 2015-03-30 DIAGNOSIS — L304 Erythema intertrigo: Secondary | ICD-10-CM | POA: Diagnosis not present

## 2015-04-19 ENCOUNTER — Ambulatory Visit: Payer: Medicare Other | Admitting: Obstetrics & Gynecology

## 2015-05-03 ENCOUNTER — Ambulatory Visit: Payer: Medicare Other | Admitting: Obstetrics & Gynecology

## 2015-05-06 DIAGNOSIS — M79604 Pain in right leg: Secondary | ICD-10-CM | POA: Diagnosis not present

## 2015-05-06 DIAGNOSIS — M25551 Pain in right hip: Secondary | ICD-10-CM | POA: Diagnosis not present

## 2015-05-26 DIAGNOSIS — M25551 Pain in right hip: Secondary | ICD-10-CM | POA: Diagnosis not present

## 2015-06-01 DIAGNOSIS — M25551 Pain in right hip: Secondary | ICD-10-CM | POA: Diagnosis not present

## 2015-06-03 DIAGNOSIS — M25551 Pain in right hip: Secondary | ICD-10-CM | POA: Diagnosis not present

## 2015-06-10 DIAGNOSIS — M25551 Pain in right hip: Secondary | ICD-10-CM | POA: Diagnosis not present

## 2015-06-15 DIAGNOSIS — E119 Type 2 diabetes mellitus without complications: Secondary | ICD-10-CM | POA: Diagnosis not present

## 2015-06-15 DIAGNOSIS — J45909 Unspecified asthma, uncomplicated: Secondary | ICD-10-CM | POA: Diagnosis not present

## 2015-06-15 DIAGNOSIS — E7800 Pure hypercholesterolemia, unspecified: Secondary | ICD-10-CM | POA: Diagnosis not present

## 2015-06-15 DIAGNOSIS — J40 Bronchitis, not specified as acute or chronic: Secondary | ICD-10-CM | POA: Diagnosis not present

## 2015-06-15 DIAGNOSIS — R42 Dizziness and giddiness: Secondary | ICD-10-CM | POA: Diagnosis not present

## 2015-06-17 DIAGNOSIS — M25551 Pain in right hip: Secondary | ICD-10-CM | POA: Diagnosis not present

## 2015-06-20 DIAGNOSIS — E7800 Pure hypercholesterolemia, unspecified: Secondary | ICD-10-CM | POA: Diagnosis not present

## 2015-06-20 DIAGNOSIS — E039 Hypothyroidism, unspecified: Secondary | ICD-10-CM | POA: Diagnosis not present

## 2015-06-20 DIAGNOSIS — E1165 Type 2 diabetes mellitus with hyperglycemia: Secondary | ICD-10-CM | POA: Diagnosis not present

## 2015-06-21 DIAGNOSIS — M25551 Pain in right hip: Secondary | ICD-10-CM | POA: Diagnosis not present

## 2015-06-23 DIAGNOSIS — E7800 Pure hypercholesterolemia, unspecified: Secondary | ICD-10-CM | POA: Diagnosis not present

## 2015-06-23 DIAGNOSIS — E039 Hypothyroidism, unspecified: Secondary | ICD-10-CM | POA: Diagnosis not present

## 2015-06-23 DIAGNOSIS — E1165 Type 2 diabetes mellitus with hyperglycemia: Secondary | ICD-10-CM | POA: Diagnosis not present

## 2015-06-24 DIAGNOSIS — M25551 Pain in right hip: Secondary | ICD-10-CM | POA: Diagnosis not present

## 2015-06-28 DIAGNOSIS — M25551 Pain in right hip: Secondary | ICD-10-CM | POA: Diagnosis not present

## 2015-06-28 DIAGNOSIS — M79604 Pain in right leg: Secondary | ICD-10-CM | POA: Diagnosis not present

## 2015-08-23 DIAGNOSIS — R1033 Periumbilical pain: Secondary | ICD-10-CM | POA: Diagnosis not present

## 2015-08-23 DIAGNOSIS — K219 Gastro-esophageal reflux disease without esophagitis: Secondary | ICD-10-CM | POA: Diagnosis not present

## 2015-08-23 DIAGNOSIS — R140 Abdominal distension (gaseous): Secondary | ICD-10-CM | POA: Diagnosis not present

## 2015-08-23 DIAGNOSIS — R194 Change in bowel habit: Secondary | ICD-10-CM | POA: Diagnosis not present

## 2015-08-29 DIAGNOSIS — R197 Diarrhea, unspecified: Secondary | ICD-10-CM | POA: Diagnosis not present

## 2015-08-29 DIAGNOSIS — F430 Acute stress reaction: Secondary | ICD-10-CM | POA: Diagnosis not present

## 2015-08-29 DIAGNOSIS — R42 Dizziness and giddiness: Secondary | ICD-10-CM | POA: Diagnosis not present

## 2015-10-04 DIAGNOSIS — Z23 Encounter for immunization: Secondary | ICD-10-CM | POA: Diagnosis not present

## 2015-10-11 DIAGNOSIS — E7800 Pure hypercholesterolemia, unspecified: Secondary | ICD-10-CM | POA: Diagnosis not present

## 2015-10-13 DIAGNOSIS — E7800 Pure hypercholesterolemia, unspecified: Secondary | ICD-10-CM | POA: Diagnosis not present

## 2015-10-13 DIAGNOSIS — Z0000 Encounter for general adult medical examination without abnormal findings: Secondary | ICD-10-CM | POA: Diagnosis not present

## 2015-10-13 DIAGNOSIS — Z7984 Long term (current) use of oral hypoglycemic drugs: Secondary | ICD-10-CM | POA: Diagnosis not present

## 2015-10-13 DIAGNOSIS — E119 Type 2 diabetes mellitus without complications: Secondary | ICD-10-CM | POA: Diagnosis not present

## 2015-10-13 DIAGNOSIS — R42 Dizziness and giddiness: Secondary | ICD-10-CM | POA: Diagnosis not present

## 2015-10-13 DIAGNOSIS — J45909 Unspecified asthma, uncomplicated: Secondary | ICD-10-CM | POA: Diagnosis not present

## 2015-10-24 DIAGNOSIS — R42 Dizziness and giddiness: Secondary | ICD-10-CM | POA: Diagnosis not present

## 2015-10-24 DIAGNOSIS — H903 Sensorineural hearing loss, bilateral: Secondary | ICD-10-CM | POA: Diagnosis not present

## 2015-12-15 DIAGNOSIS — Z79899 Other long term (current) drug therapy: Secondary | ICD-10-CM | POA: Diagnosis not present

## 2015-12-15 DIAGNOSIS — D649 Anemia, unspecified: Secondary | ICD-10-CM | POA: Diagnosis not present

## 2015-12-15 DIAGNOSIS — E1165 Type 2 diabetes mellitus with hyperglycemia: Secondary | ICD-10-CM | POA: Diagnosis not present

## 2015-12-15 DIAGNOSIS — J45909 Unspecified asthma, uncomplicated: Secondary | ICD-10-CM | POA: Diagnosis not present

## 2015-12-15 DIAGNOSIS — E039 Hypothyroidism, unspecified: Secondary | ICD-10-CM | POA: Diagnosis not present

## 2015-12-15 DIAGNOSIS — M7712 Lateral epicondylitis, left elbow: Secondary | ICD-10-CM | POA: Diagnosis not present

## 2015-12-15 DIAGNOSIS — J301 Allergic rhinitis due to pollen: Secondary | ICD-10-CM | POA: Diagnosis not present

## 2015-12-15 DIAGNOSIS — E7800 Pure hypercholesterolemia, unspecified: Secondary | ICD-10-CM | POA: Diagnosis not present

## 2015-12-23 DIAGNOSIS — E7800 Pure hypercholesterolemia, unspecified: Secondary | ICD-10-CM | POA: Diagnosis not present

## 2015-12-23 DIAGNOSIS — E039 Hypothyroidism, unspecified: Secondary | ICD-10-CM | POA: Diagnosis not present

## 2015-12-23 DIAGNOSIS — E1165 Type 2 diabetes mellitus with hyperglycemia: Secondary | ICD-10-CM | POA: Diagnosis not present

## 2015-12-23 DIAGNOSIS — E611 Iron deficiency: Secondary | ICD-10-CM | POA: Diagnosis not present

## 2016-05-04 ENCOUNTER — Telehealth: Payer: Self-pay | Admitting: Hematology and Oncology

## 2016-05-04 NOTE — Telephone Encounter (Signed)
Received a phone call from Lattie Haw to schedule the pt for a hem appt. Appt has been scheduled for the pt to see Dr. Lindi Adie on 5/22 at 1pm. Lattie Haw will notify the pt. Letter mailed.

## 2016-05-23 ENCOUNTER — Telehealth: Payer: Self-pay | Admitting: Hematology

## 2016-05-23 ENCOUNTER — Ambulatory Visit (HOSPITAL_BASED_OUTPATIENT_CLINIC_OR_DEPARTMENT_OTHER): Payer: Medicare Other | Admitting: Hematology

## 2016-05-23 ENCOUNTER — Ambulatory Visit (HOSPITAL_BASED_OUTPATIENT_CLINIC_OR_DEPARTMENT_OTHER): Payer: Medicare Other

## 2016-05-23 ENCOUNTER — Encounter: Payer: Self-pay | Admitting: Hematology

## 2016-05-23 DIAGNOSIS — E611 Iron deficiency: Secondary | ICD-10-CM

## 2016-05-23 DIAGNOSIS — D509 Iron deficiency anemia, unspecified: Secondary | ICD-10-CM | POA: Insufficient documentation

## 2016-05-23 DIAGNOSIS — K219 Gastro-esophageal reflux disease without esophagitis: Secondary | ICD-10-CM | POA: Diagnosis not present

## 2016-05-23 DIAGNOSIS — K2270 Barrett's esophagus without dysplasia: Secondary | ICD-10-CM

## 2016-05-23 LAB — CBC & DIFF AND RETIC
BASO%: 0.4 % (ref 0.0–2.0)
Basophils Absolute: 0 10*3/uL (ref 0.0–0.1)
EOS%: 3.9 % (ref 0.0–7.0)
Eosinophils Absolute: 0.3 10*3/uL (ref 0.0–0.5)
HCT: 38.8 % (ref 34.8–46.6)
HGB: 12.6 g/dL (ref 11.6–15.9)
Immature Retic Fract: 3.8 % (ref 1.60–10.00)
LYMPH%: 42 % (ref 14.0–49.7)
MCH: 27.8 pg (ref 25.1–34.0)
MCHC: 32.5 g/dL (ref 31.5–36.0)
MCV: 85.7 fL (ref 79.5–101.0)
MONO#: 0.3 10*3/uL (ref 0.1–0.9)
MONO%: 3.9 % (ref 0.0–14.0)
NEUT#: 3.6 10*3/uL (ref 1.5–6.5)
NEUT%: 49.8 % (ref 38.4–76.8)
Platelets: 225 10*3/uL (ref 145–400)
RBC: 4.53 10*6/uL (ref 3.70–5.45)
RDW: 15.9 % — ABNORMAL HIGH (ref 11.2–14.5)
Retic %: 1.14 % (ref 0.70–2.10)
Retic Ct Abs: 51.64 10*3/uL (ref 33.70–90.70)
WBC: 7.2 10*3/uL (ref 3.9–10.3)
lymph#: 3 10*3/uL (ref 0.9–3.3)

## 2016-05-23 LAB — IRON AND TIBC
%SAT: 17 % — ABNORMAL LOW (ref 21–57)
Iron: 75 ug/dL (ref 41–142)
TIBC: 443 ug/dL (ref 236–444)
UIBC: 368 ug/dL (ref 120–384)

## 2016-05-23 LAB — FERRITIN: Ferritin: 13 ng/ml (ref 9–269)

## 2016-05-23 NOTE — Progress Notes (Signed)
[facility]  [phone] Fax:([phone]) [place] consult Note   Patient Care Team: Jonathon Jordan, MD as PCP - General (Family Medicine) 05/23/2016  CHIEF COMPLAINTS/PURPOSE OF CONSULTATION:  Mild Iron Deficiency  HISTORY OF PRESENTING ILLNESS: 05/23/16 Catherine [name] 66 y.o. female is here because of history of iron deficiency. She presents to my clinic by herself. She was referred by her gastroenterologist Dr. Collene Mares.   She was found to have low iron level from Endocrinologist, with ferritin in low 10's (per pt). No history of anemia. She is a vegetarian, occasionally eats chicken or fish (once a week). Her last menstrual period was more than 50 years ago. Her husbands also has iron deficiency.  She denies recent chest pain on exertion, shortness of breath on minimal exertion, pre-syncopal episodes but gets palpitations. She had not noticed any recent bleeding such as epistaxis, hematuria or hematochezia The patient denies over the counter NSAID ingestion. She is not on antiplatelets agents. Her last colonoscopy was 03/24/14 and revealed a few scattered diverticula, a hyper plastic polyp was removed from the descending colon and  Tubular adenoma was removed from the rectum.  She had no prior history or diagnosis of cancer. Her age appropriate screening programs are up-to-date. She denies any pica and eats a mostly vegetarian diet with no red meat.  The patient has been taking over-the-counter iron supplement intermittently in the past few years. She tolerated well before, but has developed skin rash lately, so she stopped.     Last CBC differential: 04/26/16 WBC: 9.1 RBC: 441 HCB: 12.7 HCT: 37.4 MCV: 84.7 MCH: 28.7 MCHC: 33.9 RDW: 16.014 PLT: 291   She presents to the clinic today as a referral from Dr. Collene Mares at [facility]. She reports her iron goes low. Her GI put her on iron and it gave her rashes. She also gets rashes from spicy  food and chocolate. Dr. Collene Mares put her iron with Vitamin C, And then iron supplements. Her ferritin was first 11 than went to 13.5 and reduced to 12. She never had anemia before. She goes to her GI, Editor, commissioning, endocrinologist, and PCP. Her Dm is managed and she is on metformin. There was minor bleeding and swelling in her stomach but now it is resolved. She never had blood in her stool. She was diagnosed with IBS but it was a long time ago. She was recommended to go back to her home country and that was resolved. She had a cyst on her ovary and she had a bilateral oophorectomy. She feels that she gets palpitations. She walks and exercises for her right leg pain. She had PT and now does those exercises at home. Her pain and knees is much better now. She reports her husband and her are vegetarians and do not eat red meat do to religious reasons. She is concerned with her levels so she would be fine if she received IV iron. She will get lightheaded after doing some house work. She would get tired and she was not diagnosed with Vertigo. She finds bruises on her right arm and leg.   She will be going to [place] July 3rd and wants to get a treatment or a regimine before she goes.  She has not had a mammogram in 2 years. But gets a pa smear every year. She will be traveling in early august and will do labs and visit after trip.       MEDICAL HISTORY:  Past Medical History:  Diagnosis Date  . Anal fissure   . Anxiety   . Asthma   . Barrett esophagus    normal now  . Cholelithiasis   . Diabetes mellitus   . Diverticulosis    Dr Olevia Perches  . Gastritis   . GERD (gastroesophageal reflux disease)   . Hyperlipidemia   . Hyperplastic polyps of stomach   . Hypothyroidism   . IBS (irritable bowel syndrome)   . Osteopenia   . Recurrent UTI    seeing Dr Dahlsted-recent culture negative    SURGICAL HISTORY: Past Surgical History:  Procedure Laterality Date  . CATARACT EXTRACTION Bilateral    R, then L.   Cataracts excision  . LAPAROSCOPY N/A 05/19/2012   Procedure: LAPAROSCOPY OPERATIVE;  Surgeon: Lyman Speller, MD;  Location: [facility];  Service: Gynecology;  Laterality: N/A;  . SALPINGOOPHORECTOMY Bilateral 05/19/2012   Procedure: SALPINGO OOPHORECTOMY;  Surgeon: Lyman Speller, MD;  Location: [facility];  Service: Gynecology;  Laterality: Bilateral;  including left dermoid cyst  . TUBAL LIGATION     with D&C due to Incomplete AB    SOCIAL HISTORY: Social History   Social History  . Marital status: Married    Spouse name: N/A  . Number of children: 2  . Years of education: N/A   Occupational History  . Not on file.   Social History Main Topics  . Smoking status: Never Smoker  . Smokeless tobacco: Never Used  . Alcohol use No  . Drug use: No  . Sexual activity: Yes    Partners: Male    Birth control/ protection: Surgical, Post-menopausal     Comment: BTL   Other Topics Concern  . Not on file   Social History Narrative   Originally from [place]    FAMILY HISTORY: Family History  Problem Relation Age of Onset  . Lymphoma Brother   . Hypertension Father   . Stroke Father   . Diabetes Father 90       on po meds  . Diabetes Mother 90       on insulin  . Heart disease Brother        heart stent  . Thyroid disease Son   . Diabetes Sister        maternal aunt  . Colon cancer Neg Hx     ALLERGIES:  is allergic to amoxicillin; erythromycin; penicillins; and shrimp [shellfish allergy].  MEDICATIONS:  Current Outpatient Prescriptions  Medication Sig Dispense Refill  . clobetasol ointment (TEMOVATE) 0.05 % Apply topically 2 (two) times daily. Apply as directed twice daily (Patient taking differently: Apply topically 2 (two) times daily as needed. Apply as directed twice daily) 60 g 3  . esomeprazole (NEXIUM) 40 MG capsule Take 1 capsule (40 mg total) by mouth 2 (two) times daily. (Patient taking differently: Take 40 mg by mouth daily. ) 60 capsule 10  . estradiol  (ESTRACE) 0.1 MG/GM vaginal cream Use pea sized amount of cream externally two to three times weekly 42.5 g 3  . fexofenadine (ALLEGRA) 180 MG tablet Take 180 mg by mouth at bedtime as needed.    Marland Kitchen levothyroxine (SYNTHROID, LEVOTHROID) 25 MCG tablet 25-50 mcg. Take 2 tablets on Sat-Tues & one Wed-Fri    . lidocaine (XYLOCAINE) 5 % ointment Apply topically daily. Use as directed 1.25 g 0  . metFORMIN (GLUCOPHAGE) 500 MG tablet Take 500 mg by mouth daily with breakfast.     . Multiple Vitamins-Minerals (CENTRUM PO) Take 1 tablet by mouth  daily.      Marland Kitchen PROAIR HFA 108 (90 BASE) MCG/ACT inhaler as needed.    . Probiotic Product (PROBIOTIC DAILY PO) Take by mouth daily as needed. Flora-B daily     . simvastatin (ZOCOR) 5 MG tablet Take 5 mg by mouth at bedtime. Takes at lunch     No current facility-administered medications for this visit.     REVIEW OF SYSTEMS:   Constitutional: Denies fevers, chills or abnormal night sweats (+) slight dizziness Eyes: Denies blurriness of vision, double vision or watery eyes Ears, nose, mouth, throat, and face: Denies mucositis or sore throat Respiratory: Denies cough, dyspnea or wheezes Cardiovascular: Denies palpitation, chest discomfort or lower extremity swelling Gastrointestinal:  Denies nausea, heartburn or change in bowel habits Skin: (+) skin rash (+) Bruising on her right lower arm and leg Lymphatics: Denies new lymphadenopathy or easy bruising Neurological:Denies numbness, tingling or new weaknesses Behavioral/Psych: Mood is stable, no new changes  All other systems were reviewed with the patient and are negative.  PHYSICAL EXAMINATION:  Vitals:   05/23/16 1121  BP: 130/68  Pulse: 78  Resp: 18  Temp: 98.2 F (36.8 C)   Filed Weights   05/23/16 1121  Weight: 154 lb 8 oz (70.1 kg)    GENERAL:alert, no distress and comfortable SKIN: skin color, texture, turgor are normal, no rashes or significant lesions EYES: normal, conjunctiva are pink  and non-injected, sclera clear OROPHARYNX:no exudate, no erythema and lips, buccal mucosa, and tongue normal  NECK: supple, thyroid normal size, non-tender, without nodularity LYMPH:  no palpable lymphadenopathy in the cervical, axillary or inguinal LUNGS: clear to auscultation and percussion with normal breathing effort HEART: regular rate & rhythm and no murmurs and no lower extremity edema ABDOMEN:abdomen soft, non-tender and normal bowel sounds Musculoskeletal:no cyanosis of digits and no clubbing  PSYCH: alert & oriented x 3 with fluent speech NEURO: no focal motor/sensory deficits  LABORATORY DATA:  I have reviewed the data as listed CBC Latest Ref Rng & Units 05/23/2016 05/12/2012  WBC 3.9 - 10.3 10e3/uL 7.2 8.8  Hemoglobin 11.6 - 15.9 g/dL 12.6 12.3  Hematocrit 34.8 - 46.6 % 38.8 37.5  Platelets 145 - 400 10e3/uL 225 274    CMP Latest Ref Rng & Units 05/12/2012 05/02/2012  Glucose 70 - 99 mg/dL 103(H) 120(H)  BUN 6 - 23 mg/dL 15 14  Creatinine 0.50 - 1.10 mg/dL 0.65 0.7  Sodium 135 - 145 mEq/L 137 136  Potassium 3.5 - 5.1 mEq/L 4.5 4.2  Chloride 96 - 112 mEq/L 100 102  CO2 19 - 32 mEq/L 29 27  Calcium 8.4 - 10.5 mg/dL 10.0 9.5   Results for KELIE, GAINEY (MRN [id number]) as of 05/23/2016 21:33  Ref. Range 05/23/2016 12:46  Iron Latest Ref Range: 41 - 142 ug/dL 75  UIBC Latest Ref Range: 120 - 384 ug/dL 368  TIBC Latest Ref Range: 236 - 444 ug/dL 443  %SAT Latest Ref Range: 21 - 57 % 17 (L)  Ferritin Latest Ref Range: 9 - 269 ng/ml 13    RADIOGRAPHIC STUDIES: I have personally reviewed the radiological images as listed and agreed with the findings in the report. No results found.  ASSESSMENT & PLAN: ALEETA SCHMALTZ is a 66 y.o. female who is has a history of GERD, hypothyroidism, Asthma, DVT, HLD, DM, colon polyp and is here for her consult on Mild iron deficiency.   1. Mild Iron deficiency, without anemia  -We discussed the possible reasons of why  her iron would remain  low: either malabsorption or mild slow bleeding -She has been postmenopausal for many years. Her recent GI workup including EGD and colonoscopy was unremarkable.  -I think her iron deficiency is mild, likely related to her diet, she is a vegetarian.   -She has not been anemic before.  -She has had iron pills and supplements before but she reacts with a rash lately  -I repeated her CBC and iron study today, which showed hemoglobin 12.6, normal MCV, ferritin 13, normal serum iron, TIBC on the high end of normal limits. This is consistent with mild iron deficiency.  -I encouraged her to increase her iron rich food I gave her a list -I also encouraged her to take a multivitamin with minerals including iron. I think she will likely tolerate low-dose iron supplement well.  -We also discussed the option of intravenous iron, if her iron deficiency gets worse  -Patient voiced good understanding about above, and he agrees with plan.   2. GERD and Barrett's esophagus -Follow up with GI Dr. Collene Mares   PLAN: - she will try iron rich to food and multivitamin with minerals, I did not recommend IV iron at this point. -lab today -f/u in 3 months and labs a weeks before visit   All questions were answered. The patient knows to call the clinic with any problems, questions or concerns. I spent 30 minutes counseling the patient face to face. The total time spent in the appointment was 40 minutes and more than 50% was on counseling.    This document serves as a record of services personally performed by Truitt Merle, MD. It was created on her behalf by Joslyn Devon, a trained medical scribe. The creation of this record is based on the scribe's personal observations and the provider's statements to them. This document has been checked and approved by the attending provider.    Truitt Merle, MD 05/23/2016

## 2016-05-23 NOTE — Telephone Encounter (Signed)
Gave patient AVS and calender per 5/16 los.  

## 2016-05-25 ENCOUNTER — Telehealth: Payer: Self-pay

## 2016-05-25 NOTE — Telephone Encounter (Signed)
Pt called for lab results from 5/16. Gave her results. Discussed iron rich diet and multivitamin with iron.  She asked for labs to be faxed to Darcus Austin PCP, GI and endocrinologist. Done. And mailed labs to pt per her request.

## 2016-05-27 ENCOUNTER — Other Ambulatory Visit: Payer: Self-pay | Admitting: Obstetrics & Gynecology

## 2016-05-28 ENCOUNTER — Telehealth: Payer: Self-pay | Admitting: *Deleted

## 2016-05-28 NOTE — Telephone Encounter (Signed)
Patient has scheduled her mammogram for 06/07/16 at [facility].

## 2016-05-28 NOTE — Telephone Encounter (Signed)
Medication refill request: Estradiol & clobetasol ointment Last AEX:  03-04-15  Next AEX: 06-22-16  Last MMG (if hormonal medication request): 11-17-13 WNL (Spoke with patient regarding MMG- patient states she will call and schedule- gave Patient number for [facility])  Refill authorized: Please advise

## 2016-05-28 NOTE — Telephone Encounter (Signed)
Called pt per Dr Ernestina Penna instructions & informed of mild iron deficiency similar to last result but no anemia.  She reports that her ferritin was 16 in Feb after being on oral iron & 13.2 in April at Dr Inda Merlin office.

## 2016-05-28 NOTE — Telephone Encounter (Signed)
-----   Message from Truitt Merle, MD sent at 05/27/2016 10:26 PM EDT ----- Please call pt and let her know the lab results. Her mild iron deficiency is similar to her previous outside test results, no anemia.   Thanks  Truitt Merle  05/27/2016

## 2016-05-29 ENCOUNTER — Encounter: Payer: BC Managed Care – PPO | Admitting: Hematology and Oncology

## 2016-06-15 ENCOUNTER — Telehealth: Payer: Self-pay | Admitting: *Deleted

## 2016-06-15 NOTE — Telephone Encounter (Signed)
Received call this am from triage stating that pt called.  Returned call & pt states that she has tried oral iron-Prenatal & Centrum with iron & both made her itch & caused rash.  She states she is unable to tol oral iron.  She is trying to add foods higher in iron to her diet.  She has an appt with her endocrinologist-Dr Bubba Camp soon & will ask them to draw labs to look at levels & also ask them to send to Dr Burr Medico & then she would like to talk with Dr Burr Medico.  She plans to travel to [place] for a week & would like to have her levels.  FYI to Dr Feng/Pod RN.

## 2016-06-21 ENCOUNTER — Encounter: Payer: Self-pay | Admitting: Obstetrics & Gynecology

## 2016-06-21 ENCOUNTER — Ambulatory Visit: Payer: Medicare Other | Admitting: Obstetrics & Gynecology

## 2016-06-21 VITALS — BP 118/60 | HR 80 | Resp 16 | Ht 62.5 in | Wt 155.0 lb

## 2016-06-21 DIAGNOSIS — Z01419 Encounter for gynecological examination (general) (routine) without abnormal findings: Secondary | ICD-10-CM

## 2016-06-21 MED ORDER — CLOBETASOL PROPIONATE 0.05 % EX OINT: TOPICAL_OINTMENT | CUTANEOUS | 0 refills | Status: DC

## 2016-06-21 NOTE — Progress Notes (Signed)
66 y.o. [id number] Married [place] F here for annual exam.  Doing well.  Going to [place] in July for a wedding reception for a family  Referred to Dr. Burr Medico due to low ferritin.  Pt was recommended to take iron supplements and iron rich foods.  Is going to have blood work retested in August.  If this doesn't help, she will have IV iron.    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  SheDoesn't use the topical steroid very much.  Reports she needs to use this no more than one dosing a week.    Denies vaginal bleeding.  PCP:  Dr. Inda Merlin  Patient's last menstrual period was 01/08/2009.          Sexually active: Yes.    The current method of family planning is post menopausal status.    Exercising: Yes.    walking Smoker:  no  Health Maintenance: Pap:  03/04/15 Neg   01/15/13 Neg. HR HPV:neg  History of abnormal Pap:  no MMG:  06/2016 normal. Will call for report.  Done at [facility].   Colonoscopy:  03/2014 Normal BMD:   11/17/13 Osteopenia  TDaP:  Unsure Pneumonia vaccine(s):  Done   Zostavax:   No Hep C testing: Unsure Screening Labs: PCP   reports that she has never smoked. She has never used smokeless tobacco. She reports that she does not drink alcohol or use drugs.  Past Medical History:  Diagnosis Date  . Anal fissure   . Anxiety   . Asthma   . Barrett esophagus    normal now  . Cholelithiasis   . Diabetes mellitus   . Diverticulosis    Dr Olevia Perches  . Gastritis   . GERD (gastroesophageal reflux disease)   . Hyperlipidemia   . Hyperplastic polyps of stomach   . Hypothyroidism   . IBS (irritable bowel syndrome)   . Osteopenia   . Recurrent UTI    seeing Dr Dahlsted-recent culture negative    Past Surgical History:  Procedure Laterality Date  . CATARACT EXTRACTION Bilateral    R, then L.  Cataracts excision  . LAPAROSCOPY N/A 05/19/2012   Procedure: LAPAROSCOPY OPERATIVE;  Surgeon: Lyman Speller, MD;  Location: [facility];  Service: Gynecology;  Laterality: N/A;  .  SALPINGOOPHORECTOMY Bilateral 05/19/2012   Procedure: SALPINGO OOPHORECTOMY;  Surgeon: Lyman Speller, MD;  Location: [facility];  Service: Gynecology;  Laterality: Bilateral;  including left dermoid cyst  . TUBAL LIGATION     with D&C due to Incomplete AB    Current Outpatient Prescriptions  Medication Sig Dispense Refill  . clobetasol ointment (TEMOVATE) 0.05 % Apply topically.    Marland Kitchen esomeprazole (NEXIUM) 40 MG capsule Take 1 capsule (40 mg total) by mouth 2 (two) times daily. (Patient taking differently: Take 40 mg by mouth daily. ) 60 capsule 10  . ESTRACE VAGINAL 0.1 MG/GM vaginal cream USE PEA SIZED AMOUNT OF CREAM EXTERNALLY TWO TO THREE TIMES WEEKLY 42.5 g 0  . fexofenadine (ALLEGRA) 180 MG tablet Take 180 mg by mouth at bedtime as needed.    Marland Kitchen levothyroxine (SYNTHROID, LEVOTHROID) 25 MCG tablet 25-50 mcg. Take 2 tablets on Sat-Tues & one Wed-Fri    . metFORMIN (GLUCOPHAGE) 500 MG tablet Take 500 mg by mouth daily with breakfast.     . Multiple Vitamins-Minerals (CENTRUM PO) Take 1 tablet by mouth daily.      Marland Kitchen PROAIR HFA 108 (90 BASE) MCG/ACT inhaler as needed.    Marland Kitchen  Probiotic Product (PROBIOTIC DAILY PO) Take by mouth daily as needed. Flora-B daily     . simvastatin (ZOCOR) 5 MG tablet Take 5 mg by mouth at bedtime. Takes at lunch     No current facility-administered medications for this visit.     Family History  Problem Relation Age of Onset  . Lymphoma Brother   . Hypertension Father   . Stroke Father   . Diabetes Father 90       on po meds  . Diabetes Mother 90       on insulin  . Heart disease Brother        heart stent  . Thyroid disease Son   . Diabetes Sister        maternal aunt  . Colon cancer Neg Hx     ROS:  Pertinent items are noted in HPI.  Otherwise, a comprehensive ROS was negative.  Exam:   BP 118/60 (BP Location: Right Arm, Patient Position: Sitting, Cuff Size: Normal)   Pulse 80   Resp 16   Ht 5' 2.5" (1.588 m)   Wt 155 lb (70.3 kg)   LMP  01/08/2009   BMI 27.90 kg/m   Weight change: -6#'s  Height: 5' 2.5" (158.8 cm)  Ht Readings from Last 3 Encounters:  06/21/16 5' 2.5" (1.588 m)  05/23/16 5\' 2"  (1.575 m)  03/04/15 5' 2.5" (1.588 m)    General appearance: alert, cooperative and appears stated age Head: Normocephalic, without obvious abnormality, atraumatic Neck: no adenopathy, supple, symmetrical, trachea midline and thyroid normal to inspection and palpation Lungs: clear to auscultation bilaterally Breasts: normal appearance, no masses or tenderness Heart: regular rate and rhythm Abdomen: soft, non-tender; bowel sounds normal; no masses,  no organomegaly Extremities: extremities normal, atraumatic, no cyanosis or edema Skin: Skin color, texture, turgor normal. No rashes or lesions Lymph nodes: Cervical, supraclavicular, and axillary nodes normal. No abnormal inguinal nodes palpated Neurologic: Grossly normal   Pelvic: External genitalia:  no lesions              Urethra:  normal appearing urethra with no masses, tenderness or lesions              Bartholins and Skenes: normal                 Vagina: normal appearing vagina with normal color and discharge, no lesions              Cervix: no lesions              Pap taken: No. Bimanual Exam:  Uterus:  normal size, contour, position, consistency, mobility, non-tender              Adnexa: no mass, fullness, tenderness               Rectovaginal: Confirms               Anus:  normal sphincter tone, no lesions  Chaperone was present for exam.  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  Released from Dr. Dorina Hoyer to be seen prn.  P:   Mammogram yearly pap smear negative 2017.  No pap smear today. Recommended BMD this year.  Order will be faxed.  Pt will call to schedule.   Does need RFs for clobetasol and estradiol return annually or prn

## 2016-06-22 ENCOUNTER — Ambulatory Visit: Payer: Medicare Other | Admitting: Obstetrics & Gynecology

## 2016-06-29 ENCOUNTER — Other Ambulatory Visit: Payer: Self-pay | Admitting: Family Medicine

## 2016-06-29 ENCOUNTER — Ambulatory Visit
Admission: RE | Admit: 2016-06-29 | Discharge: 2016-06-29 | Disposition: A | Discharge status: Home / Self Care | Payer: Medicare Other | Source: Ambulatory Visit | Attending: Family Medicine | Admitting: Family Medicine

## 2016-06-29 ENCOUNTER — Encounter: Payer: Self-pay | Admitting: Obstetrics & Gynecology

## 2016-06-29 DIAGNOSIS — R509 Fever, unspecified: Secondary | ICD-10-CM

## 2016-06-29 DIAGNOSIS — R05 Cough: Secondary | ICD-10-CM

## 2016-06-29 DIAGNOSIS — R059 Cough, unspecified: Secondary | ICD-10-CM

## 2016-08-13 ENCOUNTER — Other Ambulatory Visit: Payer: Self-pay | Admitting: Family Medicine

## 2016-08-13 ENCOUNTER — Ambulatory Visit
Admission: RE | Admit: 2016-08-13 | Discharge: 2016-08-13 | Disposition: A | Discharge status: Home / Self Care | Payer: Medicare Other | Source: Ambulatory Visit | Attending: Family Medicine | Admitting: Family Medicine

## 2016-08-13 DIAGNOSIS — Z09 Encounter for follow-up examination after completed treatment for conditions other than malignant neoplasm: Secondary | ICD-10-CM

## 2016-08-16 ENCOUNTER — Other Ambulatory Visit (HOSPITAL_BASED_OUTPATIENT_CLINIC_OR_DEPARTMENT_OTHER): Payer: Medicare Other

## 2016-08-16 ENCOUNTER — Ambulatory Visit: Payer: Medicare Other

## 2016-08-16 DIAGNOSIS — D509 Iron deficiency anemia, unspecified: Secondary | ICD-10-CM

## 2016-08-16 DIAGNOSIS — E611 Iron deficiency: Secondary | ICD-10-CM

## 2016-08-16 LAB — CBC & DIFF AND RETIC
BASO%: 0.7 % (ref 0.0–2.0)
Basophils Absolute: 0.1 10*3/uL (ref 0.0–0.1)
EOS%: 6.5 % (ref 0.0–7.0)
Eosinophils Absolute: 0.5 10*3/uL (ref 0.0–0.5)
HCT: 36.8 % (ref 34.8–46.6)
HGB: 12.1 g/dL (ref 11.6–15.9)
Immature Retic Fract: 6.3 % (ref 1.60–10.00)
LYMPH%: 44.7 % (ref 14.0–49.7)
MCH: 27.7 pg (ref 25.1–34.0)
MCHC: 32.9 g/dL (ref 31.5–36.0)
MCV: 84.2 fL (ref 79.5–101.0)
MONO#: 0.3 10*3/uL (ref 0.1–0.9)
MONO%: 4.8 % (ref 0.0–14.0)
NEUT#: 3 10*3/uL (ref 1.5–6.5)
NEUT%: 43.3 % (ref 38.4–76.8)
Platelets: 229 10*3/uL (ref 145–400)
RBC: 4.37 10*6/uL (ref 3.70–5.45)
RDW: 15 % — ABNORMAL HIGH (ref 11.2–14.5)
Retic %: 1.09 % (ref 0.70–2.10)
Retic Ct Abs: 47.63 10*3/uL (ref 33.70–90.70)
WBC: 7 10*3/uL (ref 3.9–10.3)
lymph#: 3.1 10*3/uL (ref 0.9–3.3)
nRBC: 0 % (ref 0–0)

## 2016-08-16 LAB — IRON AND TIBC
%SAT: 20 % — ABNORMAL LOW (ref 21–57)
Iron: 80 ug/dL (ref 41–142)
TIBC: 405 ug/dL (ref 236–444)
UIBC: 325 ug/dL (ref 120–384)

## 2016-08-16 LAB — FERRITIN: Ferritin: 11 ng/ml (ref 9–269)

## 2016-08-19 ENCOUNTER — Telehealth: Payer: Self-pay | Admitting: Hematology

## 2016-08-19 NOTE — Telephone Encounter (Signed)
Open in error

## 2016-08-20 ENCOUNTER — Telehealth: Payer: Self-pay

## 2016-08-20 NOTE — Telephone Encounter (Signed)
Pt called to talk with Dr Ernestina Penna nurse. She stated it was private and would not leave a message. Call forwarded.

## 2016-08-22 NOTE — Progress Notes (Signed)
[facility]  [phone] [phone]  Clinic Follow-up Note   Patient Care Team: Jonathon Jordan, MD as PCP - General (Family Medicine) Juanita Craver, MD as Consulting Physician (Gastroenterology) 08/23/2016  CHIEF COMPLAINTS:  Follow up for Mild Iron Deficiency  HISTORY OF PRESENTING ILLNESS: Catherine [name] 66 y.o. female is here because of history of iron deficiency. She presents to my clinic by herself. She was referred by her gastroenterologist Dr. Collene Mares.   She was found to have low iron level from Endocrinologist, with ferritin in low 10's (per pt). No history of anemia. She is a vegetarian, occasionally eats chicken or fish (once a week). Her last menstrual period was more than 50 years ago. Her husbands also has iron deficiency.  She denies recent chest pain on exertion, shortness of breath on minimal exertion, pre-syncopal episodes but gets palpitations. She had not noticed any recent bleeding such as epistaxis, hematuria or hematochezia The patient denies over the counter NSAID ingestion. She is not on antiplatelets agents. Her last colonoscopy was 03/24/14 and revealed a few scattered diverticula, a hyper plastic polyp was removed from the descending colon and  Tubular adenoma was removed from the rectum.  She had no prior history or diagnosis of cancer. Her age appropriate screening programs are up-to-date. She denies any pica and eats a mostly vegetarian diet with no red meat.  The patient has been taking over-the-counter iron supplement intermittently in the past few years. She tolerated well before, but has developed skin rash lately, so she stopped.     Last CBC differential: 04/26/16 WBC: 9.1 RBC: 441 HCB: 12.7 HCT: 37.4 MCV: 84.7 MCH: 28.7 MCHC: 33.9 RDW: 16.014 PLT: 291   She presents to the clinic today as a referral from Dr. Collene Mares at [facility]. She reports her iron goes low. Her GI put her on iron and it  gave her rashes. She also gets rashes from spicy food and chocolate. Dr. Collene Mares put her iron with Vitamin C, And then iron supplements. Her ferritin was first 11 than went to 13.5 and reduced to 12. She never had anemia before. She goes to her GI, Editor, commissioning, endocrinologist, and PCP. Her Dm is managed and she is on metformin. There was minor bleeding and swelling in her stomach but now it is resolved. She never had blood in her stool. She was diagnosed with IBS but it was a long time ago. She was recommended to go back to her home country and that was resolved. She had a cyst on her ovary and she had a bilateral oophorectomy. She feels that she gets palpitations. She walks and exercises for her right leg pain. She had PT and now does those exercises at home. Her pain and knees is much better now. She reports her husband and her are vegetarians and do not eat red meat do to religious reasons. She is concerned with her levels so she would be fine if she received IV iron. She will get lightheaded after doing some house work. She would get tired and she was not diagnosed with Vertigo. She finds bruises on her right arm and leg.   She will be going to [place] July 3rd and wants to get a treatment or a regimine before she goes.  She has not had a mammogram in 2 years. But gets a pa smear every year. She will be traveling in early august and will do labs and visit after trip.    CURRENT THERAPY:  Observation   INTERVAL HISTORY:  PAYTAN RECINE is here for a follow up of his mild Iron deficiency. She presents to the clinic today reporting she had another ash from oral iron, centrum silver. She now does not have that much itching. She does not feel fatigued daily. Once in a while she will feel palpation when she goes to bed she can hear. She denies chest pain or SOB. She will walk for exercise. She sees her endocrinologist who noticed her low Hg. She had her last colonoscopy about 2 years ago and nothing was found.  She started eating some meat lately. She eats fish and chicken and some ground beef.  She was tested for a wheat allergy and she was negative for it. She will see her PCP in 2 months and her endocrinologist in december 2018 and June 2019.     MEDICAL HISTORY:  Past Medical History:  Diagnosis Date  . Anal fissure   . Anxiety   . Asthma   . Barrett esophagus    normal now  . Cholelithiasis   . Diabetes mellitus   . Diverticulosis    Dr Olevia Perches  . Gastritis   . GERD (gastroesophageal reflux disease)   . Hyperlipidemia   . Hyperplastic polyps of stomach   . Hypothyroidism   . IBS (irritable bowel syndrome)   . Osteopenia   . Recurrent UTI    seeing Dr Dahlsted-recent culture negative    SURGICAL HISTORY: Past Surgical History:  Procedure Laterality Date  . CATARACT EXTRACTION Bilateral    R, then L.  Cataracts excision  . LAPAROSCOPY N/A 05/19/2012   Procedure: LAPAROSCOPY OPERATIVE;  Surgeon: Lyman Speller, MD;  Location: [facility];  Service: Gynecology;  Laterality: N/A;  . SALPINGOOPHORECTOMY Bilateral 05/19/2012   Procedure: SALPINGO OOPHORECTOMY;  Surgeon: Lyman Speller, MD;  Location: [facility];  Service: Gynecology;  Laterality: Bilateral;  including left dermoid cyst  . TUBAL LIGATION     with D&C due to Incomplete AB    SOCIAL HISTORY: Social History   Social History  . Marital status: Married    Spouse name: N/A  . Number of children: 2  . Years of education: N/A   Occupational History  . Not on file.   Social History Main Topics  . Smoking status: Never Smoker  . Smokeless tobacco: Never Used  . Alcohol use No  . Drug use: No  . Sexual activity: Yes    Partners: Male    Birth control/ protection: Surgical, Post-menopausal     Comment: BTL   Other Topics Concern  . Not on file   Social History Narrative   Originally from [place]    FAMILY HISTORY: Family History  Problem Relation Age of Onset  . Lymphoma Brother   . Hypertension  Father   . Stroke Father   . Diabetes Father 90       on po meds  . Diabetes Mother 90       on insulin  . Heart disease Brother        heart stent  . Thyroid disease Son   . Diabetes Sister        maternal aunt  . Colon cancer Neg Hx     ALLERGIES:  is allergic to chocolate; erythromycin; pneumococcal vaccines; and shrimp [shellfish allergy].  MEDICATIONS:  Current Outpatient Prescriptions  Medication Sig Dispense Refill  . clobetasol ointment (TEMOVATE) 0.05 % Apply a small amount topically bid up to 7 days. (Patient  taking differently: Apply 1 application topically 2 (two) times a week. Apply a small amount topically bid up to 7 days.) 60 g 0  . esomeprazole (NEXIUM) 40 MG capsule Take 1 capsule (40 mg total) by mouth 2 (two) times daily. (Patient taking differently: Take 40 mg by mouth daily. ) 60 capsule 10  . ESTRACE VAGINAL 0.1 MG/GM vaginal cream USE PEA SIZED AMOUNT OF CREAM EXTERNALLY TWO TO THREE TIMES WEEKLY (Patient taking differently: USE PEA SIZED AMOUNT OF CREAM EXTERNALLY TWO  TIMES WEEKLY) 42.5 g 0  . fexofenadine (ALLEGRA) 180 MG tablet Take 180 mg by mouth at bedtime as needed.    Marland Kitchen levothyroxine (SYNTHROID, LEVOTHROID) 25 MCG tablet 25-50 mcg. Take 2 tablets on  Mon -  Fri ;  1 tablet  Sat & Sun.    . metFORMIN (GLUCOPHAGE) 500 MG tablet Take 500 mg by mouth daily with breakfast.     . PROAIR HFA 108 (90 BASE) MCG/ACT inhaler as needed.    . simvastatin (ZOCOR) 5 MG tablet Take 5 mg by mouth daily. Takes at lunch     No current facility-administered medications for this visit.     REVIEW OF SYSTEMS:  Constitutional: Denies fevers, chills or abnormal night sweats (+) slight dizziness (+) diet change Eyes: Denies blurriness of vision, double vision or watery eyes Ears, nose, mouth, throat, and face: Denies mucositis or sore throat Respiratory: Denies cough, dyspnea or wheezes Cardiovascular: Denies chest discomfort or lower extremity swelling (+) occasional  palpatations Gastrointestinal:  Denies nausea, heartburn or change in bowel habits Skin: (+) slight itching  Lymphatics: Denies new lymphadenopathy or easy bruising Neurological:Denies numbness, tingling or new weaknesses Behavioral/Psych: Mood is stable, no new changes  All other systems were reviewed with the patient and are negative.  PHYSICAL EXAMINATION:  Vitals:   08/23/16 1138  BP: 121/64  Pulse: 79  Resp: 20  Temp: 98.7 F (37.1 C)  SpO2: 99%   Filed Weights   08/23/16 1138  Weight: 157 lb 3.2 oz (71.3 kg)    GENERAL:alert, no distress and comfortable SKIN: skin color, texture, turgor are normal, no rashes or significant lesions EYES: normal, conjunctiva are pink and non-injected, sclera clear OROPHARYNX:no exudate, no erythema and lips, buccal mucosa, and tongue normal  NECK: supple, thyroid normal size, non-tender, without nodularity LYMPH:  no palpable lymphadenopathy in the cervical, axillary or inguinal LUNGS: clear to auscultation and percussion with normal breathing effort HEART: regular rate & rhythm and no murmurs and no lower extremity edema ABDOMEN:abdomen soft, non-tender and normal bowel sounds Musculoskeletal:no cyanosis of digits and no clubbing  PSYCH: alert & oriented x 3 with fluent speech NEURO: no focal motor/sensory deficits  LABORATORY DATA:  I have reviewed the data as listed CBC Latest Ref Rng & Units 08/16/2016 05/23/2016 05/12/2012  WBC 3.9 - 10.3 10e3/uL 7.0 7.2 8.8  Hemoglobin 11.6 - 15.9 g/dL 12.1 12.6 12.3  Hematocrit 34.8 - 46.6 % 36.8 38.8 37.5  Platelets 145 - 400 10e3/uL 229 225 274    CMP Latest Ref Rng & Units 05/12/2012 05/02/2012  Glucose 70 - 99 mg/dL 103(H) 120(H)  BUN 6 - 23 mg/dL 15 14  Creatinine 0.50 - 1.10 mg/dL 0.65 0.7  Sodium 135 - 145 mEq/L 137 136  Potassium 3.5 - 5.1 mEq/L 4.5 4.2  Chloride 96 - 112 mEq/L 100 102  CO2 19 - 32 mEq/L 29 27  Calcium 8.4 - 10.5 mg/dL 10.0 9.5    Results for [facility], Rulon Eisenmenger A (  MRN  [id number]) as of 08/22/2016 17:23  Ref. Range 05/23/2016 12:46 08/16/2016 12:00  Iron Latest Ref Range: 41 - 142 ug/dL 75 80  UIBC Latest Ref Range: 120 - 384 ug/dL 368 325  TIBC Latest Ref Range: 236 - 444 ug/dL 443 405  %SAT Latest Ref Range: 21 - 57 % 17 (L) 20 (L)  Ferritin Latest Ref Range: 9 - 269 ng/ml 13 11     RADIOGRAPHIC STUDIES: I have personally reviewed the radiological images as listed and agreed with the findings in the report. Dg Chest 2 View  Result Date: 08/13/2016 CLINICAL DATA:  Right lower lobe pneumonia. EXAM: CHEST  2 VIEW COMPARISON:  06/29/2016 FINDINGS: The small patchy area of infiltrate present at the right lung base the prior study has cleared. There is slight new atelectasis at the left base laterally. No consolidative infiltrate. No effusions. Heart size is normal. Calcification in the thoracic aorta. No acute bone abnormalities. IMPRESSION: 1. Clearing of the small patchy infiltrate in the right lower lobe. 2. Minimal atelectasis left lung base. 3. Aortic atherosclerosis. Electronically Signed   By: Lorriane Shire M.D.   On: 08/13/2016 12:34    ASSESSMENT & PLAN: Catherine [name] is a 66 y.o. female who is has a history of GERD, hypothyroidism, Asthma, DVT, HLD, DM, colon polyp and is here for her consult on Mild iron deficiency.   1. Mild Iron deficiency, without anemia  -We discussed the possible reasons of why her iron would remain low: either malabsorption or mild slow bleeding -She has been postmenopausal for many years. Her recent GI workup including EGD and colonoscopy was unremarkable.  -celiac panel was negative, done by Dr. Collene Mares  -I think her iron deficiency is mild, likely related to her diet, she is a vegetarian.   -She has not been anemic before.  -She has had iron pills and supplements before but she reacts with a rash lately, she returns herself with low-dose oral iron pill, and developed rash again. She could not tolerate oral iron.  -I repeated her  CBC and iron study (Catherine), which showed hemoglobin 12.6, normal MCV, ferritin 13, normal serum iron, TIBC on the high end of normal limits. This is consistent with mild iron deficiency.  -I encouraged her to increase her iron rich food I gave her a list -We reviewed her iron study and Labs from 08/16/16 and she is now in normal limits. She again is not anemic. She does not have significant fatigue or other symptoms from iron deficiency. She does not need IV iron. She is clinically doing well.  -I discussed symptoms of her being anemic like tiredness and ongoing palpitations. She knows to contact me if needed.  -I think she can check her iron and blood test levels every 6 months with her primary care physician. I will f/u with her in 1 year and if her levels remain stable I can release her.    2. GERD and Barrett's esophagus -Follow up with GI Dr. Collene Mares   PLAN: -She could not tolerate oral iron, we'll continue our rich food.  -No need IV iron -Lab and f/u in 12 months    All questions were answered. The patient knows to call the clinic with any problems, questions or concerns. I spent 15 minutes counseling the patient face to face. The total time spent in the appointment was 20 minutes and more than 50% was on counseling.    This document serves as a record of services personally  performed by Truitt Merle, MD. It was created on her behalf by Joslyn Devon, a trained medical scribe. The creation of this record is based on the scribe's personal observations and the provider's statements to them. This document has been checked and approved by the attending provider.    Truitt Merle, MD 08/23/2016

## 2016-08-23 ENCOUNTER — Ambulatory Visit (HOSPITAL_BASED_OUTPATIENT_CLINIC_OR_DEPARTMENT_OTHER): Payer: Medicare Other | Admitting: Hematology

## 2016-08-23 ENCOUNTER — Telehealth: Payer: Self-pay

## 2016-08-23 ENCOUNTER — Encounter: Payer: Self-pay | Admitting: Hematology

## 2016-08-23 DIAGNOSIS — K219 Gastro-esophageal reflux disease without esophagitis: Secondary | ICD-10-CM

## 2016-08-23 DIAGNOSIS — E611 Iron deficiency: Secondary | ICD-10-CM | POA: Diagnosis not present

## 2016-08-23 NOTE — Telephone Encounter (Signed)
Scheduled return visit for 08/23/17 also printed avs and calender.

## 2016-08-24 ENCOUNTER — Telehealth: Payer: Self-pay

## 2016-08-24 ENCOUNTER — Telehealth: Payer: Self-pay | Admitting: *Deleted

## 2016-08-24 NOTE — Telephone Encounter (Signed)
Pt called requesting labs be faxed to PCP and Dr Collene Mares ,GI. S/w pt this was done.

## 2016-08-24 NOTE — Telephone Encounter (Signed)
Faxed lab results done 08/16/16 to Dr. Darcus Austin and Dr. Juanita Craver as per pt's request and ok per Dr. Burr Medico.

## 2016-10-17 ENCOUNTER — Telehealth: Payer: Self-pay

## 2016-10-17 MED ORDER — FUSION PLUS PO CAPS
1.0000 | ORAL_CAPSULE | Freq: Every day | ORAL | 1 refills | Status: DC
Start: 2016-10-17 — End: 2020-02-18

## 2016-10-17 NOTE — Telephone Encounter (Signed)
Pt called that Dr Darcus Austin did ferritin yesterday and it was 6.7. She is feeling fine except palpitations once in a while. Going to [facility] this Friday until oct 24th. 2 years ago she used Fusion Plus iron tablets and tolerated them well. A rheumatology friend stated that some people absorb iron better in a liquid form (1 gtt in juice) than in a pill b/c of the inert ingredients in the pill. Should she take iron while in [place] or wait until she comes back? And in which form?  She would like to see Dr Burr Medico when she gets back. On 25th or week of the 29th.  Called Dr Inda Merlin office and request labs faxed to Dr Ernestina Penna pod. CBC with diff and Ferritin were drawn.

## 2016-10-17 NOTE — Telephone Encounter (Signed)
S/w pt per Dr Burr Medico note. inbasket sent for lab on 25th and Dr Burr Medico week of 29th.  Fusion plus rx sent to pharmacy

## 2016-10-17 NOTE — Addendum Note (Signed)
Addended by: Janace Hoard on: 10/17/2016 05:01 PM   Modules accepted: Orders

## 2016-10-17 NOTE — Telephone Encounter (Signed)
I am fine for her to take Fusion Plus if she tolerated before. I don't think liquid iron is better than pill, but I know citrate juice or VitC helps the absorption of iron. Yes, please schedule her appointment with me as her wish, thanks.   Truitt Merle MD

## 2016-10-19 ENCOUNTER — Telehealth: Payer: Self-pay | Admitting: Hematology

## 2016-10-19 NOTE — Telephone Encounter (Signed)
Scheduled appt per 10/10 sch message - patient is aware of appt date and time.

## 2016-11-01 ENCOUNTER — Other Ambulatory Visit (HOSPITAL_BASED_OUTPATIENT_CLINIC_OR_DEPARTMENT_OTHER): Payer: Medicare Other

## 2016-11-01 DIAGNOSIS — E611 Iron deficiency: Secondary | ICD-10-CM

## 2016-11-01 DIAGNOSIS — D509 Iron deficiency anemia, unspecified: Secondary | ICD-10-CM

## 2016-11-01 LAB — IRON AND TIBC
%SAT: 26 % (ref 21–57)
Iron: 116 ug/dL (ref 41–142)
TIBC: 441 ug/dL (ref 236–444)
UIBC: 325 ug/dL (ref 120–384)

## 2016-11-01 LAB — CBC & DIFF AND RETIC
BASO%: 0.6 % (ref 0.0–2.0)
Basophils Absolute: 0 10*3/uL (ref 0.0–0.1)
EOS%: 3.5 % (ref 0.0–7.0)
Eosinophils Absolute: 0.2 10*3/uL (ref 0.0–0.5)
HCT: 37.7 % (ref 34.8–46.6)
HGB: 12.2 g/dL (ref 11.6–15.9)
Immature Retic Fract: 5.5 % (ref 1.60–10.00)
LYMPH%: 50.3 % — ABNORMAL HIGH (ref 14.0–49.7)
MCH: 27.2 pg (ref 25.1–34.0)
MCHC: 32.4 g/dL (ref 31.5–36.0)
MCV: 84 fL (ref 79.5–101.0)
MONO#: 0.3 10*3/uL (ref 0.1–0.9)
MONO%: 3.9 % (ref 0.0–14.0)
NEUT#: 2.9 10*3/uL (ref 1.5–6.5)
NEUT%: 41.7 % (ref 38.4–76.8)
Platelets: 233 10*3/uL (ref 145–400)
RBC: 4.49 10*6/uL (ref 3.70–5.45)
RDW: 16.8 % — ABNORMAL HIGH (ref 11.2–14.5)
Retic %: 1.15 % (ref 0.70–2.10)
Retic Ct Abs: 51.64 10*3/uL (ref 33.70–90.70)
WBC: 6.9 10*3/uL (ref 3.9–10.3)
lymph#: 3.5 10*3/uL — ABNORMAL HIGH (ref 0.9–3.3)

## 2016-11-01 LAB — FERRITIN: Ferritin: 20 ng/ml (ref 9–269)

## 2016-11-05 ENCOUNTER — Ambulatory Visit (HOSPITAL_BASED_OUTPATIENT_CLINIC_OR_DEPARTMENT_OTHER): Payer: Medicare Other | Admitting: Hematology

## 2016-11-05 ENCOUNTER — Telehealth: Payer: Self-pay | Admitting: Hematology

## 2016-11-05 VITALS — BP 105/55 | HR 80 | Temp 98.5°F | Resp 17 | Ht 62.5 in | Wt 161.3 lb

## 2016-11-05 DIAGNOSIS — E611 Iron deficiency: Secondary | ICD-10-CM

## 2016-11-05 NOTE — Progress Notes (Signed)
[place]  [phone] [phone]  Clinic Follow-up Note   Patient Care Team: Darcus Austin, MD as PCP - General (Family Medicine) Juanita Craver, MD as Consulting Physician (Gastroenterology) 11/05/2016  CHIEF COMPLAINTS:  Follow up for Mild Iron Deficiency  HISTORY OF PRESENTING ILLNESS: 05/23/16 Catherine [name] 66 y.o. female is here because of history of iron deficiency. She presents to my clinic by herself. She was referred by her gastroenterologist Dr. Collene Mares.   She was found to have low iron level from Endocrinologist, with ferritin in low 10's (per pt). No history of anemia. She is a vegetarian, occasionally eats chicken or fish (once a week). Her last menstrual period was more than 50 years ago. Her husbands also has iron deficiency.  She denies recent chest pain on exertion, shortness of breath on minimal exertion, pre-syncopal episodes but gets palpitations. She had not noticed any recent bleeding such as epistaxis, hematuria or hematochezia The patient denies over the counter NSAID ingestion. She is not on antiplatelets agents. Her last colonoscopy was 03/24/14 and revealed a few scattered diverticula, a hyper plastic polyp was removed from the descending colon and  Tubular adenoma was removed from the rectum.  She had no prior history or diagnosis of cancer. Her age appropriate screening programs are up-to-date. She denies any pica and eats a mostly vegetarian diet with no red meat.  The patient has been taking over-the-counter iron supplement intermittently in the past few years. She tolerated well before, but has developed skin rash lately, so she stopped.     Last CBC differential: 04/26/16 WBC: 9.1 RBC: 441 HCB: 12.7 HCT: 37.4 MCV: 84.7 MCH: 28.7 MCHC: 33.9 RDW: 16.014 PLT: 291   She presents to the clinic today as a referral from Dr. Collene Mares at [facility] She reports her iron goes low. Her GI put her on iron and it  gave her rashes. She also gets rashes from spicy food and chocolate. Dr. Collene Mares put her iron with Vitamin C, And then iron supplements. Her ferritin was first 11 than went to 13.5 and reduced to 12. She never had anemia before. She goes to her GI, Editor, commissioning, endocrinologist, and PCP. Her Dm is managed and she is on metformin. There was minor bleeding and swelling in her stomach but now it is resolved. She never had blood in her stool. She was diagnosed with IBS but it was a long time ago. She was recommended to go back to her home country and that was resolved. She had a cyst on her ovary and she had a bilateral oophorectomy. She feels that she gets palpitations. She walks and exercises for her right leg pain. She had PT and now does those exercises at home. Her pain and knees is much better now. She reports her husband and her are vegetarians and do not eat red meat do to religious reasons. She is concerned with her levels so she would be fine if she received IV iron. She will get lightheaded after doing some house work. She would get tired and she was not diagnosed with Vertigo. She finds bruises on her right arm and leg.   She will be going to [place] July 3rd and wants to get a treatment or a regimine before she goes.  She has not had a mammogram in 2 years. But gets a pa smear every year. She will be traveling in early august and will do labs and visit after trip.    CURRENT THERAPY:  Fusion plus once daily   INTERVAL HISTORY:  Catherine [name] is here for a follow up of his mild Iron deficiency. She presents to the clinic today noting she started fusion plus on 10/18/16. She is no longer having palpitations or fatigue. She feels overall much better. During her physical with her PCP, Dr. Darcus Austin on 10/11 her ferritin was 6.1 and that is why she asked for fusion plus.  She still has itching and uses a cream to manage. She is also taking allegra since 10/11 to prevent rash and allergy. She plans to  skip allegra to see if this is helping her itching. She [place] so she feels comfortable getting repeat iron study and labs in 3 months.     MEDICAL HISTORY:  Past Medical History:  Diagnosis Date  . Anal fissure   . Anxiety   . Asthma   . Barrett esophagus    normal now  . Cholelithiasis   . Diabetes mellitus   . Diverticulosis    Dr Olevia Perches  . Gastritis   . GERD (gastroesophageal reflux disease)   . Hyperlipidemia   . Hyperplastic polyps of stomach   . Hypothyroidism   . IBS (irritable bowel syndrome)   . Osteopenia   . Recurrent UTI    seeing Dr Dahlsted-recent culture negative    SURGICAL HISTORY: Past Surgical History:  Procedure Laterality Date  . CATARACT EXTRACTION Bilateral    R, then L.  Cataracts excision  . LAPAROSCOPY N/A 05/19/2012   Procedure: LAPAROSCOPY OPERATIVE;  Surgeon: Lyman Speller, MD;  Location: [facility];  Service: Gynecology;  Laterality: N/A;  . SALPINGOOPHORECTOMY Bilateral 05/19/2012   Procedure: SALPINGO OOPHORECTOMY;  Surgeon: Lyman Speller, MD;  Location: [facility];  Service: Gynecology;  Laterality: Bilateral;  including left dermoid cyst  . TUBAL LIGATION     with D&C due to Incomplete AB    SOCIAL HISTORY: Social History   Social History  . Marital status: Married    Spouse name: N/A  . Number of children: 2  . Years of education: N/A   Occupational History  . Not on file.   Social History Main Topics  . Smoking status: Never Smoker  . Smokeless tobacco: Never Used  . Alcohol use No  . Drug use: No  . Sexual activity: Yes    Partners: Male    Birth control/ protection: Surgical, Post-menopausal     Comment: BTL   Other Topics Concern  . Not on file   Social History Narrative   Originally from [place]    FAMILY HISTORY: Family History  Problem Relation Age of Onset  . Lymphoma Brother   . Hypertension Father   . Stroke Father   . Diabetes Father 90       on po meds  .  Diabetes Mother 90       on insulin  . Heart disease Brother        heart stent  . Thyroid disease Son   . Diabetes Sister        maternal aunt  . Colon cancer Neg Hx     ALLERGIES:  is allergic to chocolate; erythromycin; pneumococcal vaccines; and shrimp [shellfish allergy].  MEDICATIONS:  Current Outpatient Prescriptions  Medication Sig Dispense Refill  . clobetasol ointment (TEMOVATE) 0.05 % Apply a small amount topically bid up to 7 days. (Patient taking differently: Apply 1 application topically 2 (two) times a week. Apply a small  amount topically bid up to 7 days.) 60 g 0  . esomeprazole (NEXIUM) 40 MG capsule Take 1 capsule (40 mg total) by mouth 2 (two) times daily. (Patient taking differently: Take 40 mg by mouth daily. ) 60 capsule 10  . ESTRACE VAGINAL 0.1 MG/GM vaginal cream USE PEA SIZED AMOUNT OF CREAM EXTERNALLY TWO TO THREE TIMES WEEKLY (Patient taking differently: USE PEA SIZED AMOUNT OF CREAM EXTERNALLY TWO  TIMES WEEKLY) 42.5 g 0  . fexofenadine (ALLEGRA) 180 MG tablet Take 180 mg by mouth at bedtime as needed.    . Iron-FA-B Cmp-C-Biot-Probiotic (FUSION PLUS) CAPS Take 1 capsule by mouth daily. 30 capsule 1  . levothyroxine (SYNTHROID, LEVOTHROID) 25 MCG tablet 25-50 mcg. Take 2 tablets on  Mon -  Fri ;  1 tablet  Sat & Sun.    . metFORMIN (GLUCOPHAGE) 500 MG tablet Take 500 mg by mouth daily with breakfast.     . PROAIR HFA 108 (90 BASE) MCG/ACT inhaler as needed.    . simvastatin (ZOCOR) 5 MG tablet Take 5 mg by mouth daily. Takes at lunch     No current facility-administered medications for this visit.     REVIEW OF SYSTEMS:  Constitutional: Denies fevers, chills or abnormal night sweats  Eyes: Denies blurriness of vision, double vision or watery eyes Ears, nose, mouth, throat, and face: Denies mucositis or sore throat Respiratory: Denies cough, dyspnea or wheezes Cardiovascular: Denies chest discomfort or lower extremity swelling (+) occasional palpitations,  resolved Gastrointestinal:  Denies nausea, heartburn or change in bowel habits Skin: (+) slight itching, managed with allegra Lymphatics: Denies new lymphadenopathy or easy bruising Neurological:Denies numbness, tingling or new weaknesses Behavioral/Psych: Mood is stable, no new changes  All other systems were reviewed with the patient and are negative.  PHYSICAL EXAMINATION:   Vitals:   11/05/16 1527  BP: (!) 105/55  Pulse: 80  Resp: 17  Temp: 98.5 F (36.9 C)  SpO2: 100%   Filed Weights   11/05/16 1527  Weight: 161 lb 4.8 oz (73.2 kg)    GENERAL:alert, no distress and comfortable SKIN: skin color, texture, turgor are normal, no rashes or significant lesions EYES: normal, conjunctiva are pink and non-injected, sclera clear OROPHARYNX:no exudate, no erythema and lips, buccal mucosa, and tongue normal  NECK: supple, thyroid normal size, non-tender, without nodularity LYMPH:  no palpable lymphadenopathy in the cervical, axillary or inguinal LUNGS: clear to auscultation and percussion with normal breathing effort HEART: regular rate & rhythm and no murmurs and no lower extremity edema ABDOMEN:abdomen soft, non-tender and normal bowel sounds Musculoskeletal:no cyanosis of digits and no clubbing  PSYCH: alert & oriented x 3 with fluent speech NEURO: no focal motor/sensory deficits  LABORATORY DATA:  I have reviewed the data as listed CBC Latest Ref Rng & Units 11/01/2016 08/16/2016 05/23/2016  WBC 3.9 - 10.3 10e3/uL 6.9 7.0 7.2  Hemoglobin 11.6 - 15.9 g/dL 12.2 12.1 12.6  Hematocrit 34.8 - 46.6 % 37.7 36.8 38.8  Platelets 145 - 400 10e3/uL 233 229 225    CMP Latest Ref Rng & Units 05/12/2012 05/02/2012  Glucose 70 - 99 mg/dL 103(H) 120(H)  BUN 6 - 23 mg/dL 15 14  Creatinine 0.50 - 1.10 mg/dL 0.65 0.7  Sodium 135 - 145 mEq/L 137 136  Potassium 3.5 - 5.1 mEq/L 4.5 4.2  Chloride 96 - 112 mEq/L 100 102  CO2 19 - 32 mEq/L 29 27  Calcium 8.4 - 10.5 mg/dL 10.0 9.5    Results  for  Catherine [name], Catherine [name] (MRN [id number]) as of 08/22/2016 17:23  Ref. Range 05/23/2016 12:46 08/16/2016 12:00 11/01/2016 13:52  Iron Latest Ref Range: 41 - 142 ug/dL 75 80 116  UIBC Latest Ref Range: 120 - 384 ug/dL 368 325 325  TIBC Latest Ref Range: 236 - 444 ug/dL 443 405 441  %SAT Latest Ref Range: 21 - 57 % 17 (L) 20 (L) 26  Ferritin Latest Ref Range: 9 - 269 ng/ml 13 11 20      RADIOGRAPHIC STUDIES: I have personally reviewed the radiological images as listed and agreed with the findings in the report. No results found.  ASSESSMENT & PLAN: Catherine [name] is a 66 y.o. female who is has a history of GERD, hypothyroidism, Asthma, DVT, HLD, DM, colon polyp and is here for her consult on Mild iron deficiency.   1. Mild Iron deficiency, without anemia  -We discussed the possible reasons of why her iron would remain low: either malabsorption or mild slow bleeding -She has been postmenopausal for many years. Her recent GI workup including EGD and colonoscopy was unremarkable.  -celiac panel was negative, done by Dr. Collene Mares  -I think her iron deficiency is mild, likely related to her diet, she is a vegetarian.   -She has not been anemic before.  -She has had iron pills and supplements before but she reacts with a rash lately, she returns herself with low-dose oral iron pill, and developed rash again. She could not tolerate oral ferrous sulfate -I previously repeated her CBC and iron study (05/23/16), which showed hemoglobin 12.6, normal MCV, ferritin 13, normal serum iron, TIBC on the high end of normal limits. This is consistent with mild iron deficiency.  -I previously encouraged her to increase her iron rich food I gave her a list -I previously discussed symptoms of her being anemic like tiredness and ongoing palpitations. She knows to contact me if needed.  - She does not need IV iron. She is clinically doing well.  -She has started fusion plus as her oral iron supplement, overall tolerating well,  with mild skin itchiness, for which she takes Allegra -We reviewed her latest iron study from 11/01/16 and they are within normal limits, Ferritin at 20, her iron level has improved.  Hg is normal at 12.2  -She will continue Fusion plus once daily, which she started on 10/18/16 -Will copy note to Dr. Collene Mares and Dr. Inda Merlin  -F/u in 6 months with labs every 3 months    2. GERD and Barrett's esophagus -Previously advised to follow up with GI Dr. Collene Mares  3. Itching, allergy reaction to oral iron  -She will continue Allegra and anti-histamine cream  -I suggest she skip allegra 1-2 days to see if her rash returns -no rash on exam    PLAN: -Continue Fusion Plus once daily, if she does have allergy reaction, OK to stop or use intermittently as she can tolerate   -Lab in 3 months -F/u in 6 months with lab a few days before   -Copy note to Dr. Collene Mares and Dr. Inda Merlin    All questions were answered. The patient knows to call the clinic with any problems, questions or concerns. I spent 15 minutes counseling the patient face to face. The total time spent in the appointment was 20 minutes and more than 50% was on counseling.    This document serves as a record of services personally performed by Truitt Merle, MD. It was created on her behalf by Joslyn Devon, a trained  medical scribe. The creation of this record is based on the scribe's personal observations and the provider's statements to them. This document has been checked and approved by the attending provider.    Truitt Merle, MD 11/05/2016

## 2016-11-05 NOTE — Telephone Encounter (Signed)
Scheduled appt per 10/29 los - Gave patient AVS and calender per los.  

## 2016-11-06 ENCOUNTER — Encounter: Payer: Self-pay | Admitting: Hematology

## 2016-11-12 ENCOUNTER — Telehealth: Payer: Self-pay | Admitting: Hematology

## 2016-11-12 NOTE — Telephone Encounter (Signed)
FAXED OFFICE NOTE TO DR Collene Mares [phone]

## 2016-12-24 ENCOUNTER — Telehealth: Payer: Self-pay

## 2016-12-24 NOTE — Telephone Encounter (Signed)
Called pt & informed that results have not been seen.  She reports that she asked Dr Gardiner Barefoot office to send yest & also again today.  Will check with HIM tomorrow to see if it was sent there.  Informed OK to take fusion plus every other day per Dr Burr Medico.

## 2016-12-24 NOTE — Telephone Encounter (Signed)
Pt recently had blood work done at endocrinologist. They should have sent results. Has Dr Burr Medico reviewed them.  From Dr Gardiner Barefoot office. She also has question about her fusion plus. It makes her itch.  If she skips one she has less itching. Now her ferritin level is 56. Can she take fusion plus every other day?

## 2017-02-05 ENCOUNTER — Inpatient Hospital Stay: Payer: Medicare Other | Attending: Hematology

## 2017-02-05 DIAGNOSIS — E611 Iron deficiency: Secondary | ICD-10-CM | POA: Insufficient documentation

## 2017-02-05 DIAGNOSIS — D509 Iron deficiency anemia, unspecified: Secondary | ICD-10-CM

## 2017-02-05 LAB — IRON AND TIBC
Iron: 108 ug/dL (ref 41–142)
Saturation Ratios: 29 % (ref 21–57)
TIBC: 375 ug/dL (ref 236–444)
UIBC: 267 ug/dL

## 2017-02-05 LAB — CBC WITH DIFFERENTIAL (CANCER CENTER ONLY)
Basophils Absolute: 0 10*3/uL (ref 0.0–0.1)
Basophils Relative: 1 %
Eosinophils Absolute: 0.2 10*3/uL (ref 0.0–0.5)
Eosinophils Relative: 3 %
HCT: 39.8 % (ref 34.8–46.6)
Hemoglobin: 13.1 g/dL (ref 11.6–15.9)
Lymphocytes Relative: 52 %
Lymphs Abs: 3.4 10*3/uL — ABNORMAL HIGH (ref 0.9–3.3)
MCH: 28.4 pg (ref 25.1–34.0)
MCHC: 32.9 g/dL (ref 31.5–36.0)
MCV: 86.1 fL (ref 79.5–101.0)
Monocytes Absolute: 0.3 10*3/uL (ref 0.1–0.9)
Monocytes Relative: 5 %
Neutro Abs: 2.5 10*3/uL (ref 1.5–6.5)
Neutrophils Relative %: 39 %
Platelet Count: 233 10*3/uL (ref 145–400)
RBC: 4.62 MIL/uL (ref 3.70–5.45)
RDW: 15.7 % (ref 11.2–16.1)
WBC Count: 6.5 10*3/uL (ref 3.9–10.3)

## 2017-02-05 LAB — RETICULOCYTES
RBC.: 4.62 MIL/uL (ref 3.70–5.45)
Retic Count, Absolute: 37 10*3/uL (ref 33.7–90.7)
Retic Ct Pct: 0.8 % (ref 0.7–2.1)

## 2017-02-05 LAB — FERRITIN: Ferritin: 17 ng/mL (ref 9–269)

## 2017-02-07 ENCOUNTER — Telehealth: Payer: Self-pay | Admitting: *Deleted

## 2017-02-07 NOTE — Telephone Encounter (Signed)
TCT patient regarding recent labs. Spoke with patient and informed her that her recent labs were within normal limits.  She wants Dr. Burr Medico to know that she is taking her iron supplements every M-W-F. Mrs. Catherine [name] is also asking for her lab results be mailed to her.  This will be done. No other questions or concerns voiced.

## 2017-04-19 ENCOUNTER — Telehealth: Payer: Self-pay | Admitting: Hematology

## 2017-04-19 NOTE — Telephone Encounter (Signed)
PAL  Moved 4/26 f/u to 4/30. Lab scheduled 4/23 remains as scheduled. Spoke with patient re change and confirmed both appointment dates/times.

## 2017-04-30 ENCOUNTER — Inpatient Hospital Stay: Payer: Medicare Other | Attending: Hematology

## 2017-04-30 DIAGNOSIS — K2270 Barrett's esophagus without dysplasia: Secondary | ICD-10-CM | POA: Diagnosis not present

## 2017-04-30 DIAGNOSIS — E119 Type 2 diabetes mellitus without complications: Secondary | ICD-10-CM | POA: Diagnosis not present

## 2017-04-30 DIAGNOSIS — E611 Iron deficiency: Secondary | ICD-10-CM | POA: Diagnosis not present

## 2017-04-30 DIAGNOSIS — D509 Iron deficiency anemia, unspecified: Secondary | ICD-10-CM

## 2017-04-30 DIAGNOSIS — E039 Hypothyroidism, unspecified: Secondary | ICD-10-CM | POA: Insufficient documentation

## 2017-04-30 LAB — RETICULOCYTES
RBC.: 4.33 MIL/uL (ref 3.70–5.45)
Retic Count, Absolute: 39 10*3/uL (ref 33.7–90.7)
Retic Ct Pct: 0.9 % (ref 0.7–2.1)

## 2017-04-30 LAB — CBC WITH DIFFERENTIAL (CANCER CENTER ONLY)
Basophils Absolute: 0 10*3/uL (ref 0.0–0.1)
Basophils Relative: 1 %
Eosinophils Absolute: 0.2 10*3/uL (ref 0.0–0.5)
Eosinophils Relative: 3 %
HCT: 37.5 % (ref 34.8–46.6)
Hemoglobin: 12.4 g/dL (ref 11.6–15.9)
Lymphocytes Relative: 45 %
Lymphs Abs: 2.9 10*3/uL (ref 0.9–3.3)
MCH: 28.6 pg (ref 25.1–34.0)
MCHC: 33.1 g/dL (ref 31.5–36.0)
MCV: 86.6 fL (ref 79.5–101.0)
Monocytes Absolute: 0.3 10*3/uL (ref 0.1–0.9)
Monocytes Relative: 5 %
Neutro Abs: 2.9 10*3/uL (ref 1.5–6.5)
Neutrophils Relative %: 46 %
Platelet Count: 256 10*3/uL (ref 145–400)
RBC: 4.33 MIL/uL (ref 3.70–5.45)
RDW: 15.3 % — ABNORMAL HIGH (ref 11.2–14.5)
WBC Count: 6.3 10*3/uL (ref 3.9–10.3)

## 2017-04-30 LAB — FERRITIN: Ferritin: 33 ng/mL (ref 9–269)

## 2017-04-30 LAB — IRON AND TIBC
Iron: 70 ug/dL (ref 41–142)
Saturation Ratios: 20 % — ABNORMAL LOW (ref 21–57)
TIBC: 350 ug/dL (ref 236–444)
UIBC: 280 ug/dL

## 2017-05-03 ENCOUNTER — Ambulatory Visit: Payer: Medicare Other | Admitting: Hematology

## 2017-05-06 NOTE — Progress Notes (Signed)
[place]  [phone] [phone]  Clinic Follow-up Note   Patient Care Team: Darcus Austin, MD as PCP - General (Family Medicine) Juanita Craver, MD as Consulting Physician (Gastroenterology) Jacelyn Pi, MD as Consulting Physician (Endocrinology) 05/07/2017  CHIEF COMPLAINTS:  Follow up for Mild Iron Deficiency  HISTORY OF PRESENTING ILLNESS: 05/23/16 Catherine [name] 67 y.o. female is here because of history of iron deficiency. She presents to my clinic by herself. She was referred by her gastroenterologist Dr. Collene Mares.   She was found to have low iron level from Endocrinologist, with ferritin in low 10's (per pt). No history of anemia. She is a vegetarian, occasionally eats chicken or fish (once a week). Her last menstrual period was more than 50 years ago. Her husbands also has iron deficiency.  She denies recent chest pain on exertion, shortness of breath on minimal exertion, pre-syncopal episodes but gets palpitations. She had not noticed any recent bleeding such as epistaxis, hematuria or hematochezia The patient denies over the counter NSAID ingestion. She is not on antiplatelets agents. Her last colonoscopy was 03/24/14 and revealed a few scattered diverticula, a hyper plastic polyp was removed from the descending colon and  Tubular adenoma was removed from the rectum.  She had no prior history or diagnosis of cancer. Her age appropriate screening programs are up-to-date. She denies any pica and eats a mostly vegetarian diet with no red meat.  The patient has been taking over-the-counter iron supplement intermittently in the past few years. She tolerated well before, but has developed skin rash lately, so she stopped.     Last CBC differential: 04/26/16 WBC: 9.1 RBC: 441 HCB: 12.7 HCT: 37.4 MCV: 84.7 MCH: 28.7 MCHC: 33.9 RDW: 16.014 PLT: 291   She presents to the clinic today as a referral from Dr. Collene Mares at [facility]. She  reports her iron goes low. Her GI put her on iron and it gave her rashes. She also gets rashes from spicy food and chocolate. Dr. Collene Mares put her iron with Vitamin C, And then iron supplements. Her ferritin was first 11 than went to 13.5 and reduced to 12. She never had anemia before. She goes to her GI, Editor, commissioning, endocrinologist, and PCP. Her Dm is managed and she is on metformin. There was minor bleeding and swelling in her stomach but now it is resolved. She never had blood in her stool. She was diagnosed with IBS but it was a long time ago. She was recommended to go back to her home country and that was resolved. She had a cyst on her ovary and she had a bilateral oophorectomy. She feels that she gets palpitations. She walks and exercises for her right leg pain. She had PT and now does those exercises at home. Her pain and knees is much better now. She reports her husband and her are vegetarians and do not eat red meat do to religious reasons. She is concerned with her levels so she would be fine if she received IV iron. She will get lightheaded after doing some house work. She would get tired and she was not diagnosed with Vertigo. She finds bruises on her right arm and leg.   She will be going to [place] July 3rd and wants to get a treatment or a regimine before she goes.  She has not had a mammogram in 2 years. But gets a pa smear every year. She will be traveling in early august and will do labs and visit  after trip.    CURRENT THERAPY: Fusion plus 3 times a week   INTERVAL HISTORY:  Catherine [name] is here for a follow up of his mild Iron deficiency. She is doing well overall. She is taking oral iron MWF due to itching that occurs with daily use. She now only has to take allegra twice a week. She denies fatigue or any symptoms if her iron is low. She is a pretty active person and she exercises. She will follow up with her endocrinologist is May and November 2019. She will follow up in October 2019  with Dr. Inda Merlin.   Her most recent iron studies from 04/30/2017 are as follows: Reticulocytes are WNL, Ferritin is WNL, Iron studies show: UIBC decreased to 20. CBC shows: RDW elevated to 15.3.   On review of systems, she reports fatigue after a long day. She is consuming more fruit as well (almonds, dates, apples, and banana). She is diabetic and she only consumes 2 dates a day and her A1c is 6.5. She denies any other symptoms. She notes that she will be traveling out of the country in June.     MEDICAL HISTORY:  Past Medical History:  Diagnosis Date  . Anal fissure   . Anxiety   . Asthma   . Barrett esophagus    normal now  . Cholelithiasis   . Diabetes mellitus   . Diverticulosis    Dr Olevia Perches  . Gastritis   . GERD (gastroesophageal reflux disease)   . Hyperlipidemia   . Hyperplastic polyps of stomach   . Hypothyroidism   . IBS (irritable bowel syndrome)   . Osteopenia   . Recurrent UTI    seeing Dr Dahlsted-recent culture negative    SURGICAL HISTORY: Past Surgical History:  Procedure Laterality Date  . CATARACT EXTRACTION Bilateral    R, then L.  Cataracts excision  . LAPAROSCOPY N/A 05/19/2012   Procedure: LAPAROSCOPY OPERATIVE;  Surgeon: Lyman Speller, MD;  Location: [facility];  Service: Gynecology;  Laterality: N/A;  . SALPINGOOPHORECTOMY Bilateral 05/19/2012   Procedure: SALPINGO OOPHORECTOMY;  Surgeon: Lyman Speller, MD;  Location: [facility];  Service: Gynecology;  Laterality: Bilateral;  including left dermoid cyst  . TUBAL LIGATION     with D&C due to Incomplete AB    SOCIAL HISTORY: Social History   Socioeconomic History  . Marital status: Married    Spouse name: Not on file  . Number of children: 2  . Years of education: Not on file  . Highest education level: Not on file  Occupational History  . Not on file  Social Needs  . Financial resource strain: Not on file  . Food insecurity:    Worry: Not on file    Inability: Not on file  .  Transportation needs:    Medical: Not on file    Non-medical: Not on file  Tobacco Use  . Smoking status: Never Smoker  . Smokeless tobacco: Never Used  Substance and Sexual Activity  . Alcohol use: No  . Drug use: No  . Sexual activity: Yes    Partners: Male    Birth control/protection: Surgical, Post-menopausal    Comment: BTL  Lifestyle  . Physical activity:    Days per week: Not on file    Minutes per session: Not on file  . Stress: Not on file  Relationships  . Social connections:    Talks on phone: Not on file    Gets together: Not on file  Attends religious service: Not on file    Active member of club or organization: Not on file    Attends meetings of clubs or organizations: Not on file    Relationship status: Not on file  . Intimate partner violence:    Fear of current or ex partner: Not on file    Emotionally abused: Not on file    Physically abused: Not on file    Forced sexual activity: Not on file  Other Topics Concern  . Not on file  Social History Narrative   Originally from [place]    FAMILY HISTORY: Family History  Problem Relation Age of Onset  . Lymphoma Brother   . Hypertension Father   . Stroke Father   . Diabetes Father 90       on po meds  . Diabetes Mother 90       on insulin  . Heart disease Brother        heart stent  . Thyroid disease Son   . Diabetes Sister        maternal aunt  . Colon cancer Neg Hx     ALLERGIES:  is allergic to chocolate; erythromycin; pneumococcal vaccines; and shrimp [shellfish allergy].  MEDICATIONS:  Current Outpatient Medications  Medication Sig Dispense Refill  . clobetasol ointment (TEMOVATE) 0.05 % Apply a small amount topically bid up to 7 days. (Patient taking differently: Apply 1 application topically 2 (two) times a week. Apply a small amount topically bid up to 7 days.) 60 g 0  . esomeprazole (NEXIUM) 40 MG capsule Take 1 capsule (40 mg total) by mouth 2 (two) times daily. (Patient taking  differently: Take 40 mg by mouth daily. ) 60 capsule 10  . ESTRACE VAGINAL 0.1 MG/GM vaginal cream USE PEA SIZED AMOUNT OF CREAM EXTERNALLY TWO TO THREE TIMES WEEKLY (Patient taking differently: USE PEA SIZED AMOUNT OF CREAM EXTERNALLY TWO  TIMES WEEKLY) 42.5 g 0  . fexofenadine (ALLEGRA) 180 MG tablet Take 180 mg by mouth at bedtime as needed.    . Iron-FA-B Cmp-C-Biot-Probiotic (FUSION PLUS) CAPS Take 1 capsule by mouth daily. 30 capsule 1  . levothyroxine (SYNTHROID, LEVOTHROID) 25 MCG tablet Take 25 mcg by mouth as directed. 2 tabs mon-fri then 1 tab on sat and sun    . metFORMIN (GLUCOPHAGE) 500 MG tablet Take 500 mg by mouth daily with breakfast.     . OVER THE COUNTER MEDICATION     . PROAIR HFA 108 (90 BASE) MCG/ACT inhaler as needed.    . simvastatin (ZOCOR) 5 MG tablet Take 5 mg by mouth daily. Takes at lunch    . meclizine (ANTIVERT) 25 MG tablet Take 1 tablet by mouth every 8 (eight) hours as needed.     No current facility-administered medications for this visit.     REVIEW OF SYSTEMS:  Constitutional: Denies fevers, chills or abnormal night sweats  Eyes: Denies blurriness of vision, double vision or watery eyes Ears, nose, mouth, throat, and face: Denies mucositis or sore throat Respiratory: Denies cough, dyspnea or wheezes Cardiovascular: Denies chest discomfort or lower extremity swelling (+) occasional palpitations, resolved Gastrointestinal:  Denies nausea, heartburn or change in bowel habits Skin: (+) slight itching, managed with allegra Lymphatics: Denies new lymphadenopathy or easy bruising Neurological:Denies numbness, tingling or new weaknesses Behavioral/Psych: Mood is stable, no new changes  All other systems were reviewed with the patient and are negative.  PHYSICAL EXAMINATION:   Vitals:   05/07/17 1110  BP: (!) 103/58  Pulse: 81  Resp: 18  Temp: 98.4 F (36.9 C)  SpO2: 100%   Filed Weights   05/07/17 1110  Weight: 157 lb (71.2 kg)     GENERAL:alert, no distress and comfortable SKIN: skin color, texture, turgor are normal, no rashes or significant lesions EYES: normal, conjunctiva are pink and non-injected, sclera clear OROPHARYNX:no exudate, no erythema and lips, buccal mucosa, and tongue normal  NECK: supple, thyroid normal size, non-tender, without nodularity LYMPH:  no palpable lymphadenopathy in the cervical, axillary or inguinal LUNGS: clear to auscultation and percussion with normal breathing effort HEART: regular rate & rhythm and no murmurs and no lower extremity edema ABDOMEN:abdomen soft, non-tender and normal bowel sounds Musculoskeletal:no cyanosis of digits and no clubbing  PSYCH: alert & oriented x 3 with fluent speech NEURO: no focal motor/sensory deficits  LABORATORY DATA:  I have reviewed the data as listed CBC Latest Ref Rng & Units 04/30/2017 02/05/2017 11/01/2016  WBC 3.9 - 10.3 K/uL 6.3 6.5 6.9  Hemoglobin 11.6 - 15.9 g/dL 12.4 13.1 12.2  Hematocrit 34.8 - 46.6 % 37.5 39.8 37.7  Platelets 145 - 400 K/uL 256 233 233    CMP Latest Ref Rng & Units 05/12/2012 05/02/2012  Glucose 70 - 99 mg/dL 103(H) 120(H)  BUN 6 - 23 mg/dL 15 14  Creatinine 0.50 - 1.10 mg/dL 0.65 0.7  Sodium 135 - 145 mEq/L 137 136  Potassium 3.5 - 5.1 mEq/L 4.5 4.2  Chloride 96 - 112 mEq/L 100 102  CO2 19 - 32 mEq/L 29 27  Calcium 8.4 - 10.5 mg/dL 10.0 9.5    Results for Catherine [name], Catherine [name] (MRN [id number]) as of 08/22/2016 17:23  Ref. Range 05/23/2016 12:46 08/16/2016 12:00 11/01/2016 13:52 02/05/2017 10:56 04/30/2017 09:13  Iron Latest Ref Range: 41 - 142 ug/dL 75 80 116 108 70  UIBC Latest Ref Range: 120 - 384 ug/dL 368 325 325 267 280  TIBC Latest Ref Range: 236 - 444 ug/dL 443 405 441 375 350  %SAT Latest Ref Range: 21 - 57 % 17 (L) 20 (L) 26 29 20   Ferritin Latest Ref Range: 9 - 269 ng/ml 13 11 20 17  33     RADIOGRAPHIC STUDIES: I have personally reviewed the radiological images as listed and agreed with the findings  in the report. No results found.  ASSESSMENT & PLAN: Catherine [name] is a 67 y.o. female who is has a history of GERD, hypothyroidism, Asthma, DVT, HLD, DM, colon polyp and is here for her consult on Mild iron deficiency.   1. Mild Iron deficiency, without anemia  -We discussed the possible reasons of why her iron would remain low: either malabsorption or mild slow bleeding -She has been postmenopausal for many years. Her recent GI workup including EGD and colonoscopy was unremarkable.  -celiac panel was negative, done by Dr. Collene Mares  -I think her iron deficiency is mild, likely related to her diet, she is a vegetarian.   -She has not been anemic before.  -She has had iron pills and supplements before but she reacts with a rash lately, she returns herself with low-dose oral iron pill, and developed rash again. She could not tolerate oral ferrous sulfate -I previously repeated her CBC and iron study (05/23/16), which showed hemoglobin 12.6, normal MCV, ferritin 13, normal serum iron, TIBC on the high end of normal limits. This is consistent with mild iron deficiency.  -I previously encouraged her to increase her iron rich food I gave her a list -I previously  discussed symptoms of her being anemic like tiredness and ongoing palpitations. She knows to contact me if needed.  - She does not need IV iron. She is clinically doing well.  -She has started fusion plus as her oral iron supplement, overall tolerating well, with mild skin itchiness, for which she takes Allegra -We previously reviewed her latest iron study from 11/01/16 and they are within normal limits, Ferritin at 20, her iron level has improved.  Hg is normal at 12.2  -She will continue Fusion plus once daily, which she started on 10/18/16.  Due to her skin itchiness, she has decreased to 3 times a week, which she is tolerating well. -We reviewed her latest iron study from 04/30/2017 and Reticulocytes are WNL, Ferritin has improved to 33, Iron  studies show normal serum iron with slight decreased transferrin saturation.  Her iron level overall has improved with oral supplement. -Will copy note to Dr. Collene Mares, Dr. Chalmers Cater, and Dr. Inda Merlin -F/u in one year with lab a few days before    2. GERD and Barrett's esophagus -Previously advised to follow up with GI Dr. Collene Mares  3. Itching, allergy reaction to oral iron  -She will continue Allegra and anti-histamine cream  -no rash on exam    PLAN: -Continue Fusion Plus, intermittently (MWF) as she can tolerate and use allegra PRN.  -F/u in one year with lab a few days before. She will repeat iron study with Dr. Inda Merlin in 10/2017    All questions were answered. The patient knows to call the clinic with any problems, questions or concerns. I spent 10 minutes counseling the patient face to face. The total time spent in the appointment was 15 minutes and more than 50% was on counseling.    This document serves as a record of services personally performed by Truitt Merle, MD. It was created on her behalf by Steva Colder, a trained medical scribe. The creation of this record is based on the scribe's personal observations and the provider's statements to them.   I have reviewed the above documentation for accuracy and completeness, and I agree with the above.     Truitt Merle, MD 05/07/2017

## 2017-05-07 ENCOUNTER — Telehealth: Payer: Self-pay | Admitting: Hematology

## 2017-05-07 ENCOUNTER — Encounter: Payer: Self-pay | Admitting: Hematology

## 2017-05-07 ENCOUNTER — Inpatient Hospital Stay (HOSPITAL_BASED_OUTPATIENT_CLINIC_OR_DEPARTMENT_OTHER): Payer: Medicare Other | Admitting: Hematology

## 2017-05-07 VITALS — BP 103/58 | HR 81 | Temp 98.4°F | Resp 18 | Ht 62.5 in | Wt 157.0 lb

## 2017-05-07 DIAGNOSIS — E119 Type 2 diabetes mellitus without complications: Secondary | ICD-10-CM

## 2017-05-07 DIAGNOSIS — E039 Hypothyroidism, unspecified: Secondary | ICD-10-CM

## 2017-05-07 DIAGNOSIS — K2270 Barrett's esophagus without dysplasia: Secondary | ICD-10-CM | POA: Diagnosis not present

## 2017-05-07 DIAGNOSIS — E611 Iron deficiency: Secondary | ICD-10-CM | POA: Diagnosis not present

## 2017-05-07 NOTE — Telephone Encounter (Signed)
Scheduled appt per 4/30 los - Gave pt AVS and calender per los. Per pt preference labs a week apart from f/u.

## 2017-07-12 ENCOUNTER — Other Ambulatory Visit: Payer: Self-pay | Admitting: Obstetrics & Gynecology

## 2017-07-12 NOTE — Telephone Encounter (Signed)
Medication refill request: Temovante .05% Last AEX:  06/21/16 Next AEX: 10/04/17 Last MMG (if hormonal medication request): 05/11/26   Bi-rads Category 2 Benign  Refill authorized: Please refill if appropriate.   Medication refill request: Esradiol Cream 0.1mg  Last AEX:  06/21/16 Next AEX: 10/04/17 Last MMG (if hormonal medication request): 05/11/26   Bi-rads Category 2 Benign  Refill authorized: Please refill if appropriate.

## 2017-07-12 NOTE — Telephone Encounter (Signed)
Will refill  X 1 only but patient needs to have mammogram for this year

## 2017-08-13 ENCOUNTER — Encounter: Payer: Self-pay | Admitting: Obstetrics & Gynecology

## 2017-08-23 ENCOUNTER — Other Ambulatory Visit: Payer: Medicare Other

## 2017-08-23 ENCOUNTER — Ambulatory Visit: Payer: Medicare Other | Admitting: Hematology

## 2017-09-21 ENCOUNTER — Encounter

## 2017-10-04 ENCOUNTER — Encounter

## 2017-10-04 ENCOUNTER — Other Ambulatory Visit: Payer: Self-pay

## 2017-10-04 ENCOUNTER — Other Ambulatory Visit (HOSPITAL_COMMUNITY)
Admission: RE | Admit: 2017-10-04 | Discharge: 2017-10-04 | Disposition: A | Discharge status: Home / Self Care | Payer: Medicare Other | Source: Ambulatory Visit | Attending: Obstetrics & Gynecology | Admitting: Obstetrics & Gynecology

## 2017-10-04 ENCOUNTER — Encounter: Payer: Self-pay | Admitting: Obstetrics & Gynecology

## 2017-10-04 ENCOUNTER — Ambulatory Visit (INDEPENDENT_AMBULATORY_CARE_PROVIDER_SITE_OTHER): Payer: Medicare Other | Admitting: Obstetrics & Gynecology

## 2017-10-04 VITALS — BP 104/76 | HR 80 | Resp 16 | Ht 62.25 in | Wt 158.6 lb

## 2017-10-04 DIAGNOSIS — Z124 Encounter for screening for malignant neoplasm of cervix: Secondary | ICD-10-CM

## 2017-10-04 DIAGNOSIS — Z01419 Encounter for gynecological examination (general) (routine) without abnormal findings: Secondary | ICD-10-CM

## 2017-10-04 DIAGNOSIS — R42 Dizziness and giddiness: Secondary | ICD-10-CM

## 2017-10-04 NOTE — Progress Notes (Signed)
67 y.o. [id number] Married [name] here for annual exam.  Has been diagnosed with iron deficiency.  Has been followed by Dr. Burr Medico.  Hemoglobin is normal.  Has been taking fusion plus.  Is now just taking this three times a week.    Has experienced issues with feeling a spinning sensation when she lies down.  This only occurs when she is really tied.  She saw Dr. Redmond Baseman 2+ years ago.  Testing was done and was advised this was not likely vertigo.  Was advised to keep a record of symptoms.  She's had this in April and again July.  These two episodes were after she had been very busy for the previous two days.  When she went to bed, these two times, she felt like the room was spinning.  Did not have nausea or emesis.  Took meclizine and this resolved symptoms after several hours.  Would like to see him again.  Called for an appt but was advised she needs a referral.    PCP:  Dr. Inda Merlin.  Has appt in October.  Dr. Inda Merlin is retiring and she is considering changing practice.    Patient's last menstrual period was 01/08/2009.          Sexually active: Yes.    The current method of family planning is post menopausal status.    Exercising: Yes.    walk Smoker:  no  Health Maintenance: Pap:  03/04/15 Neg   01/15/13 Neg  History of abnormal Pap:  no MMG:  07/09/17 BIRADS1:neg  Colonoscopy:  03/2014 Normal  BMD:   11/17/13 osteopenia  TDaP:  Allergic reaction Pneumonia vaccine(s):  2016 Shingrix:   Discussed with pt today Hep C testing: done with PCP Screening Labs: PCP   reports that she has never smoked. She has never used smokeless tobacco. She reports that she does not drink alcohol or use drugs.  Past Medical History:  Diagnosis Date  . Anal fissure   . Anxiety   . Asthma   . Barrett esophagus    normal now  . Cholelithiasis   . Diabetes mellitus   . Diverticulosis    Dr Olevia Perches  . Gastritis   . GERD (gastroesophageal reflux disease)   . Hyperlipidemia   . Hyperplastic polyps of stomach    . Hypothyroidism   . IBS (irritable bowel syndrome)   . Osteopenia   . Recurrent UTI    seeing Dr Dahlsted-recent culture negative    Past Surgical History:  Procedure Laterality Date  . CATARACT EXTRACTION Bilateral    R, then L.  Cataracts excision  . LAPAROSCOPY N/A 05/19/2012   Procedure: LAPAROSCOPY OPERATIVE;  Surgeon: Lyman Speller, MD;  Location: [facility];  Service: Gynecology;  Laterality: N/A;  . SALPINGOOPHORECTOMY Bilateral 05/19/2012   Procedure: SALPINGO OOPHORECTOMY;  Surgeon: Lyman Speller, MD;  Location: [facility];  Service: Gynecology;  Laterality: Bilateral;  including left dermoid cyst  . TUBAL LIGATION     with D&C due to Incomplete AB    Current Outpatient Medications  Medication Sig Dispense Refill  . clobetasol ointment (TEMOVATE) 0.05 % APPLY A SMALL AMOUNT TOPICALLY TWICE DAILY UP TO 7 DAYS when having flare only 60 g 0  . desoximetasone (TOPICORT) 0.25 % cream daily.    Marland Kitchen esomeprazole (NEXIUM) 40 MG capsule Take 1 capsule (40 mg total) by mouth 2 (two) times daily. (Patient taking differently: Take 40 mg by mouth daily. ) 60 capsule 10  . estradiol (ESTRACE) 0.1  MG/GM vaginal cream USE PEA SIZED AMOUNT OF CREAM EXTERNALLY TWO  TIMES WEEKLY 42.5 g 0  . fexofenadine (ALLEGRA) 180 MG tablet Take 180 mg by mouth at bedtime as needed.    . fluticasone (FLONASE) 50 MCG/ACT nasal spray daily as needed.  11  . Iron-FA-B Cmp-C-Biot-Probiotic (FUSION PLUS) CAPS Take 1 capsule by mouth daily. 30 capsule 1  . levothyroxine (SYNTHROID, LEVOTHROID) 25 MCG tablet Take 25 mcg by mouth as directed. 2 tabs mon-fri then 1 tab on sat and sun    . lidocaine (XYLOCAINE) 5 % ointment Apply topically daily as needed.    . meclizine (ANTIVERT) 25 MG tablet Take 1 tablet by mouth every 8 (eight) hours as needed.    . metFORMIN (GLUCOPHAGE) 500 MG tablet Take 500 mg by mouth daily with breakfast.     . PROAIR HFA 108 (90 BASE) MCG/ACT inhaler as needed.    . simvastatin  (ZOCOR) 5 MG tablet Take 5 mg by mouth daily. Takes at lunch     No current facility-administered medications for this visit.     Family History  Problem Relation Age of Onset  . Lymphoma Brother   . Hypertension Father   . Stroke Father   . Diabetes Father 90       on po meds  . Diabetes Mother 90       on insulin  . Heart disease Brother        heart stent  . Thyroid disease Son   . Diabetes Sister        maternal aunt  . Colon cancer Neg Hx     Review of Systems  All other systems reviewed and are negative.   Exam:   BP 104/76 (BP Location: Right Arm, Patient Position: Sitting, Cuff Size: Large)   Pulse 80   Resp 16   Ht 5' 2.25" (1.581 m)   Wt 158 lb 9.6 oz (71.9 kg)   LMP 01/08/2009   BMI 28.78 kg/m    Height: 5' 2.25" (158.1 cm)  Ht Readings from Last 3 Encounters:  10/04/17 5' 2.25" (1.581 m)  05/07/17 5' 2.5" (1.588 m)  11/05/16 5' 2.5" (1.588 m)    General appearance: alert, cooperative and appears stated age Head: Normocephalic, without obvious abnormality, atraumatic Neck: no adenopathy, supple, symmetrical, trachea midline and thyroid normal to inspection and palpation Lungs: clear to auscultation bilaterally Breasts: normal appearance, no masses or tenderness Heart: regular rate and rhythm Abdomen: soft, non-tender; bowel sounds normal; no masses,  no organomegaly Extremities: extremities normal, atraumatic, no cyanosis or edema Skin: Skin color, texture, turgor normal. No rashes or lesions Lymph nodes: Cervical, supraclavicular, and axillary nodes normal. No abnormal inguinal nodes palpated Neurologic: Grossly normal   Pelvic: External genitalia:  no lesions              Urethra:  normal appearing urethra with no masses, tenderness or lesions              Bartholins and Skenes: normal                 Vagina: normal appearing vagina with normal color and discharge, no lesions              Cervix: no lesions              Pap taken: Yes.    Bimanual Exam:  Uterus:  normal size, contour, position, consistency, mobility, non-tender  Adnexa: normal adnexa and no mass, fullness, tenderness               Rectovaginal: Confirms               Anus:  normal sphincter tone, no lesions  Chaperone was present for exam.  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 Chalmers Cater Possible vertigo  P:   Mammogram guidelines reviewed pap smear obtained Does not need RFs for estradiol and clobetasol ointment.  Uses only once weekly.   BMD will be done with MMG in July.  Pt will schedule this. Referral made to Dr. Redmond Baseman. Return annually or prn

## 2017-10-08 LAB — CYTOLOGY - PAP: Diagnosis: NEGATIVE

## 2017-10-09 ENCOUNTER — Telehealth: Payer: Self-pay | Admitting: Obstetrics & Gynecology

## 2017-10-09 NOTE — Telephone Encounter (Signed)
Patient is calling for an update regarding her referral. Patient states she has not heard from the referring office to schedule.

## 2017-10-16 NOTE — Telephone Encounter (Signed)
Catherine [name], okay to close this encounter? °

## 2018-01-07 ENCOUNTER — Telehealth: Payer: Self-pay

## 2018-01-07 NOTE — Telephone Encounter (Signed)
Patient calls stating she needs to cancel her appointments in April of 2020 and reschedule for after July 13th 2020, she is going out of the country in a couple of days and will not be back until that time.  Sent a high priority message because patient insists on knowing these dates prior to leaving.

## 2018-05-01 ENCOUNTER — Other Ambulatory Visit: Payer: Medicare Other

## 2018-05-08 ENCOUNTER — Ambulatory Visit: Payer: Medicare Other | Admitting: Hematology

## 2018-07-14 ENCOUNTER — Telehealth: Payer: Self-pay | Admitting: Hematology

## 2018-07-14 NOTE — Telephone Encounter (Signed)
Unable to reach pt or leave vmail per 7/05 sch message.

## 2018-07-21 ENCOUNTER — Inpatient Hospital Stay: Payer: Medicare Other

## 2018-07-24 ENCOUNTER — Ambulatory Visit: Payer: Medicare Other | Admitting: Hematology

## 2018-07-24 ENCOUNTER — Other Ambulatory Visit: Payer: Medicare Other

## 2018-07-31 ENCOUNTER — Ambulatory Visit: Payer: Medicare Other | Admitting: Hematology

## 2018-08-08 ENCOUNTER — Telehealth: Payer: Self-pay | Admitting: Obstetrics & Gynecology

## 2018-08-08 NOTE — Telephone Encounter (Signed)
Patient is having "vaginal pain" and would like to see Dr.Miller.

## 2018-08-08 NOTE — Telephone Encounter (Signed)
Patient complaining of vaginal pain x2-3 days. Denies any bleeding, discharge, fever or urinary symptoms. She is having difficulty describing the discomfort she is having--but it's not severe. She would like appointment for evaluation. Made appointment with [facility] for 08-11-18 10:30am.

## 2018-08-11 ENCOUNTER — Encounter: Payer: Self-pay | Admitting: Obstetrics & Gynecology

## 2018-08-11 ENCOUNTER — Ambulatory Visit: Payer: Medicare Other | Admitting: Obstetrics & Gynecology

## 2018-08-11 ENCOUNTER — Other Ambulatory Visit: Payer: Self-pay

## 2018-08-11 VITALS — BP 102/66 | HR 76 | Temp 97.2°F | Ht 62.25 in | Wt 153.0 lb

## 2018-08-11 DIAGNOSIS — N9089 Other specified noninflammatory disorders of vulva and perineum: Secondary | ICD-10-CM

## 2018-08-11 DIAGNOSIS — R300 Dysuria: Secondary | ICD-10-CM

## 2018-08-11 LAB — POCT URINALYSIS DIPSTICK
Bilirubin, UA: NEGATIVE
Glucose, UA: NEGATIVE
Ketones, UA: NEGATIVE
Nitrite, UA: NEGATIVE
Protein, UA: NEGATIVE
Urobilinogen, UA: 0.2 E.U./dL
pH, UA: 5 (ref 5.0–8.0)

## 2018-08-11 MED ORDER — CLOBETASOL PROPIONATE 0.05 % EX OINT: TOPICAL_OINTMENT | CUTANEOUS | 0 refills | Status: DC

## 2018-08-11 MED ORDER — ESTRADIOL 0.1 MG/GM VA CREA: TOPICAL_CREAM | VAGINAL | 0 refills | Status: DC

## 2018-08-11 NOTE — Progress Notes (Signed)
GYNECOLOGY  VISIT  CC:   Vaginal pain, dysuria, urgency  HPI: 68 y.o. [id number] Married [name] here for UTI symptoms that have been present for "a few" days.  She used the topical estradiol and topical steroid over the weekend.  She used this twice daily through the weekend.  She denies vaginal bleeding.  She reports she has a lot of vulvar irritation after urination.  She is having a little pain with urination that she thinks may be related to the skin sensitivity.  Denies fever.  Denies vaginal discharge.  Reports she and her husband were in [place] in January for a family wedding.  They were there about a month.    GYNECOLOGIC HISTORY: Patient's last menstrual period was 01/08/2009. Contraception: PMP Menopausal hormone therapy: estradiol vag cream   Patient Active Problem List   Diagnosis Date Noted  . Iron deficiency 08/23/2016  . Iron deficiency anemia 05/23/2016  . LLQ pain 05/02/2012  . Osteopenia   . GERD (gastroesophageal reflux disease)   . Hyperplastic polyps of stomach   . Gastritis   . Hypothyroidism   . DIABETES MELLITUS 12/11/2007  . BARRETTS ESOPHAGUS 12/11/2007  . DIVERTICULOSIS, COLON 12/11/2007  . IRRITABLE BOWEL SYNDROME 12/11/2007  . ANAL FISSURE, HX OF 12/11/2007  . HYPERLIPIDEMIA 04/11/2007  . ANXIETY STATE, UNSPECIFIED 04/11/2007  . ALLERGIC  RHINITIS 04/11/2007  . Unspecified asthma(493.90) 04/11/2007  . G E REFLUX 04/11/2007    Past Medical History:  Diagnosis Date  . Anal fissure   . Anxiety   . Asthma   . Barrett esophagus    normal now  . Cholelithiasis   . Diabetes mellitus   . Diverticulosis    Dr Olevia Perches  . Gastritis   . GERD (gastroesophageal reflux disease)   . Hyperlipidemia   . Hyperplastic polyps of stomach   . Hypothyroidism   . IBS (irritable bowel syndrome)   . Osteopenia   . Recurrent UTI    seeing Dr Dahlsted-recent culture negative    Past Surgical History:  Procedure Laterality Date  . CATARACT EXTRACTION  Bilateral    R, then L.  Cataracts excision  . LAPAROSCOPY N/A 05/19/2012   Procedure: LAPAROSCOPY OPERATIVE;  Surgeon: Lyman Speller, MD;  Location: [facility];  Service: Gynecology;  Laterality: N/A;  . SALPINGOOPHORECTOMY Bilateral 05/19/2012   Procedure: SALPINGO OOPHORECTOMY;  Surgeon: Lyman Speller, MD;  Location: [facility];  Service: Gynecology;  Laterality: Bilateral;  including left dermoid cyst  . TUBAL LIGATION     with D&C due to Incomplete AB    MEDS:   Current Outpatient Medications on File Prior to Visit  Medication Sig Dispense Refill  . clobetasol ointment (TEMOVATE) 0.05 % APPLY A SMALL AMOUNT TOPICALLY TWICE DAILY UP TO 7 DAYS when having flare only 60 g 0  . desoximetasone (TOPICORT) 0.25 % cream daily.    Marland Kitchen esomeprazole (NEXIUM) 40 MG capsule Take 1 capsule (40 mg total) by mouth 2 (two) times daily. (Patient taking differently: Take 40 mg by mouth daily. ) 60 capsule 10  . estradiol (ESTRACE) 0.1 MG/GM vaginal cream USE PEA SIZED AMOUNT OF CREAM EXTERNALLY TWO  TIMES WEEKLY 42.5 g 0  . fexofenadine (ALLEGRA) 180 MG tablet Take 180 mg by mouth at bedtime as needed.    . fluticasone (FLONASE) 50 MCG/ACT nasal spray daily as needed.  11  . Iron-FA-B Cmp-C-Biot-Probiotic (FUSION PLUS) CAPS Take 1 capsule by mouth daily. 30 capsule 1  . levothyroxine (SYNTHROID, LEVOTHROID) 25 MCG tablet  Take 25 mcg by mouth as directed. 2 tabs mon-fri then 1 tab on sat and sun    . lidocaine (XYLOCAINE) 5 % ointment Apply topically daily as needed.    . meclizine (ANTIVERT) 25 MG tablet Take 1 tablet by mouth every 8 (eight) hours as needed.    . metFORMIN (GLUCOPHAGE) 500 MG tablet Take 500 mg by mouth daily with breakfast.     . PROAIR HFA 108 (90 BASE) MCG/ACT inhaler as needed.    . simvastatin (ZOCOR) 5 MG tablet Take 5 mg by mouth daily. Takes at lunch     No current facility-administered medications on file prior to visit.     ALLERGIES: Chocolate, Erythromycin, Pneumococcal  vaccines, Prednisone, Shrimp [shellfish allergy], and Sulfa antibiotics  Family History  Problem Relation Age of Onset  . Lymphoma Brother   . Hypertension Father   . Stroke Father   . Diabetes Father 90       on po meds  . Diabetes Mother 90       on insulin  . Heart disease Brother        heart stent  . Thyroid disease Son   . Diabetes Sister        maternal aunt  . Colon cancer Neg Hx     SH:  Married, non smoker  Review of Systems  Genitourinary: Positive for dysuria, frequency, urgency and vaginal pain.  All other systems reviewed and are negative.   PHYSICAL EXAMINATION:    BP 102/66   Pulse 76   Temp (!) 97.2 F (36.2 C) (Temporal)   Ht 5' 2.25" (1.581 m)   Wt 153 lb (69.4 kg)   LMP 01/08/2009   BMI 27.76 kg/m     General appearance: alert, cooperative and appears stated age Lymph:  no inguinal LAD noted  Pelvic: External genitalia:  no lesions but erythema present at hymenal ring, no discrete lesion              Urethra:  normal appearing urethra with no masses, tenderness or lesions              Bartholins and Skenes: normal                 Vagina: normal appearing vagina with normal color and discharge, no lesions           Chaperone was present for exam.  Assessment: Vulvar irritation/itching Dysuria H/o biopsy proven lichen sclerosus and plasma  Plan: She will use topical estradiol and steroid twice daily for one week.  She will give an update in one weeks. RFs sent to pharmacy for when she needs these updated Urine micro and culture pending.  Results will be called to pt.

## 2018-08-11 NOTE — Patient Instructions (Signed)
Use a small amount of the estrogen cream and the steroid ointment twice daily for the next week.  Please give me and update in a week.  We will call you with the urine culture.

## 2018-08-12 LAB — URINALYSIS, MICROSCOPIC ONLY: Casts: NONE SEEN /lpf

## 2018-08-13 ENCOUNTER — Telehealth: Payer: Self-pay | Admitting: *Deleted

## 2018-08-13 LAB — URINE CULTURE

## 2018-08-13 MED ORDER — NITROFURANTOIN MONOHYD MACRO 100 MG PO CAPS: 100.0000 mg | ORAL_CAPSULE | Freq: Two times a day (BID) | ORAL | 0 refills | Status: DC

## 2018-08-13 NOTE — Telephone Encounter (Signed)
-----   Message from Megan Salon, MD sent at 08/13/2018  3:35 PM EDT ----- Could you please let pt know her urine culture showed e coli so she has a UTI.  She can use macrobid 100mg  bid x 5 days for treatment.  I think since she was having more vague symptoms, it might be a good idea to have a repeat urine culture.  Order placed for this in two weeks.  Her urine micro did not have any blood in it but white cells were present.  This is consistent with infection as well.  The urine micro showed calcium oxalate crystals so she is at risk for developing kidney stones.  She needs to try and get 64 oz water daily.    CC:  Lowell Bouton

## 2018-08-13 NOTE — Telephone Encounter (Signed)
Call to patient. Patient notified of results and verbalized understanding. Instructions on use of macrobid reviewed with patient and she verbalized understanding. Follow up UC scheduled for 08-27-2018 at 1030. Patient agreeable to date and time of appointment. Patient states that she has difficulty voiding in the office, so she has a sterile specimen cup at home she will collect the urine in that am and bring to her appointment. Patient aware to increase water intake.   Prescription for macrobid #10, 0RF [facility] per patient request.   Will close encounter.

## 2018-08-26 ENCOUNTER — Other Ambulatory Visit: Payer: Self-pay

## 2018-08-27 ENCOUNTER — Ambulatory Visit (INDEPENDENT_AMBULATORY_CARE_PROVIDER_SITE_OTHER): Payer: Medicare Other

## 2018-08-27 VITALS — BP 112/68 | HR 68 | Temp 98.2°F | Resp 14 | Ht 63.0 in | Wt 152.0 lb

## 2018-08-27 DIAGNOSIS — R300 Dysuria: Secondary | ICD-10-CM | POA: Diagnosis not present

## 2018-08-27 DIAGNOSIS — Z87440 Personal history of urinary (tract) infections: Secondary | ICD-10-CM

## 2018-08-27 NOTE — Progress Notes (Signed)
Patient here for follow up urin culture. She states that she is having no symptoms after finishing her medication.

## 2018-08-28 LAB — URINALYSIS, MICROSCOPIC ONLY
Bacteria, UA: NONE SEEN
Casts: NONE SEEN /lpf
RBC, Urine: NONE SEEN /hpf (ref 0–2)

## 2018-09-03 ENCOUNTER — Telehealth: Payer: Self-pay | Admitting: Gynecology

## 2018-09-03 MED ORDER — NITROFURANTOIN MONOHYD MACRO 100 MG PO CAPS: 100.0000 mg | ORAL_CAPSULE | Freq: Two times a day (BID) | ORAL | 0 refills | Status: DC

## 2018-09-03 NOTE — Telephone Encounter (Signed)
On-call note: Patient calls having recently been treated for UTI with Macrobid x5 days.  Notes that her symptoms got better but now seem to be returning.  Prior urine culture showed E. coli sensitive to nitrofurantoin.  Currently with frequency and slight dysuria today.  No fever or chills.  No low back pain or urgency.  Recommend retreatment with Macrobid 100 mg twice daily x10 days for more extended course.  Follow-up call precautions reviewed with the patient.

## 2018-09-05 ENCOUNTER — Telehealth: Payer: Self-pay | Admitting: Obstetrics & Gynecology

## 2018-09-05 NOTE — Telephone Encounter (Signed)
Call reviewed with Dr. Sabra Heck, call returned to patient. Patient started macrobid 100 mg bid x10 days on 09/03/18. Patient reports symptoms have improved, feeling much better. Advised patient to complete abx as prescribed, return call to office for OV if symptoms do not completely resolve or new symptoms develop. Patient verbalizes understanding and is agreeable.   Routing to provider for final review. Patient is agreeable to disposition. Will close encounter.

## 2018-09-05 NOTE — Telephone Encounter (Signed)
Patient was seen 08/27/18 for a urinary problem. States she called the office after hours, Dr. Phineas Real was on call and prescribed 10 days worth of medication. She is feeling better, but would like to know when/if she should come in to have her urine checked again.

## 2018-10-06 ENCOUNTER — Ambulatory Visit: Payer: Medicare Other | Admitting: Obstetrics & Gynecology

## 2018-10-15 ENCOUNTER — Encounter: Payer: Self-pay | Admitting: Gynecology

## 2018-11-21 ENCOUNTER — Other Ambulatory Visit: Payer: Self-pay

## 2018-11-24 ENCOUNTER — Telehealth: Payer: Self-pay | Admitting: Obstetrics & Gynecology

## 2018-11-24 NOTE — Telephone Encounter (Signed)
Spoke with patient. Patient is scheduled for AEX on 11/25/18, asking if she will need to provide a urine sample?   Patient denies any urinary symptoms or concerns, RN advised no urine sample needed. Not part of AEX. Questions answered.   Routing to provider for final review. Patient is agreeable to disposition. Will close encounter.

## 2018-11-24 NOTE — Telephone Encounter (Signed)
Patient would like to know if she should bring a urine specimen to her annual exam 11/25/18. States she usually brings her own due to not being able to void in office.

## 2018-11-25 ENCOUNTER — Ambulatory Visit (INDEPENDENT_AMBULATORY_CARE_PROVIDER_SITE_OTHER): Payer: Medicare Other | Admitting: Obstetrics & Gynecology

## 2018-11-25 ENCOUNTER — Encounter: Payer: Self-pay | Admitting: Obstetrics & Gynecology

## 2018-11-25 ENCOUNTER — Other Ambulatory Visit: Payer: Self-pay

## 2018-11-25 VITALS — BP 116/64 | HR 72 | Temp 96.9°F | Resp 12 | Ht 62.25 in | Wt 148.8 lb

## 2018-11-25 DIAGNOSIS — Z01419 Encounter for gynecological examination (general) (routine) without abnormal findings: Secondary | ICD-10-CM | POA: Diagnosis not present

## 2018-11-25 MED ORDER — CLOBETASOL PROPIONATE 0.05 % EX OINT: TOPICAL_OINTMENT | CUTANEOUS | 1 refills | Status: DC

## 2018-11-25 MED ORDER — ESTRADIOL 0.1 MG/GM VA CREA: TOPICAL_CREAM | VAGINAL | 2 refills | Status: DC

## 2018-11-25 MED ORDER — ALBUTEROL SULFATE HFA 108 (90 BASE) MCG/ACT IN AERS: 1.0000 | INHALATION_SPRAY | Freq: Four times a day (QID) | RESPIRATORY_TRACT | 2 refills | Status: AC | PRN

## 2018-11-25 NOTE — Progress Notes (Signed)
68 y.o. [id number] Married [name] here for annual exam.  Doing well.  Denies vaginal bleeding.  She is seeing her grandchildren.    Last blood work with Dr. Chalmers Cater this month showed HbA1C was 6.1 and ferritin level was normal.    Uses topical estradiol externally three times a week.    PCP:  Dr. Stephanie Acre is her new PCP.  She has appt scheduled at the end of December.    Patient's last menstrual period was 01/08/2009.          Sexually active: Yes.    The current method of family planning is post menopausal status.    Exercising: Yes.    walking and treadmill Smoker:  no  Health Maintenance: Pap:   10/04/17 Neg  03/04/15 Neg              01/15/13 Neg  History of abnormal Pap:  no MMG:  07/09/17 BIRADS 1 negative Colonoscopy:  03/2014 Normal BMD:   11/17/13 osteopenia TDaP:  Allergic reaction Pneumonia vaccine(s):  2016 Shingrix:  She is not sure she wants to do this right now.   Hep C testing: unsure -- possibly done with PCP Screening Labs: PCP   reports that she has never smoked. She has never used smokeless tobacco. She reports that she does not drink alcohol or use drugs.  Past Medical History:  Diagnosis Date  . Anal fissure   . Anxiety   . Asthma   . Barrett esophagus    normal now  . Cholelithiasis   . Diabetes mellitus   . Diverticulosis    Dr Olevia Perches  . Gastritis   . GERD (gastroesophageal reflux disease)   . Hyperlipidemia   . Hyperplastic polyps of stomach   . Hypothyroidism   . IBS (irritable bowel syndrome)   . Osteopenia   . Recurrent UTI    seeing Dr Dahlsted-recent culture negative    Past Surgical History:  Procedure Laterality Date  . CATARACT EXTRACTION Bilateral    R, then L.  Cataracts excision  . LAPAROSCOPY N/A 05/19/2012   Procedure: LAPAROSCOPY OPERATIVE;  Surgeon: Lyman Speller, MD;  Location: [facility];  Service: Gynecology;  Laterality: N/A;  . SALPINGOOPHORECTOMY Bilateral 05/19/2012   Procedure: SALPINGO OOPHORECTOMY;  Surgeon: Lyman Speller, MD;  Location: [facility];  Service: Gynecology;  Laterality: Bilateral;  including left dermoid cyst  . TUBAL LIGATION     with D&C due to Incomplete AB    Current Outpatient Medications  Medication Sig Dispense Refill  . clobetasol ointment (TEMOVATE) 0.05 % Apply a small amount externally twice daily for up to 5 days. 60 g 0  . desoximetasone (TOPICORT) 0.25 % cream daily.    Marland Kitchen esomeprazole (NEXIUM) 40 MG capsule Take 1 capsule (40 mg total) by mouth 2 (two) times daily. (Patient taking differently: Take 40 mg by mouth daily. ) 60 capsule 10  . estradiol (ESTRACE) 0.1 MG/GM vaginal cream USE PEA SIZED AMOUNT OF CREAM EXTERNALLY TWO  TIMES WEEKLY 42.5 g 0  . fexofenadine (ALLEGRA) 180 MG tablet Take 180 mg by mouth at bedtime as needed.    . fluticasone (FLONASE) 50 MCG/ACT nasal spray daily as needed.  11  . Iron-FA-B Cmp-C-Biot-Probiotic (FUSION PLUS) CAPS Take 1 capsule by mouth daily. (Patient taking differently: Take 1 capsule by mouth 3 (three) times a week. ) 30 capsule 1  . levothyroxine (SYNTHROID, LEVOTHROID) 25 MCG tablet Take 25 mcg by mouth as directed. 2 tabs mon-fri then 1  tab on sat and sun    . lidocaine (XYLOCAINE) 5 % ointment Apply topically daily as needed.    . meclizine (ANTIVERT) 25 MG tablet Take 1 tablet by mouth every 8 (eight) hours as needed.    . metFORMIN (GLUCOPHAGE) 500 MG tablet Take 500 mg by mouth daily with breakfast.     . PROAIR HFA 108 (90 BASE) MCG/ACT inhaler as needed.    . simvastatin (ZOCOR) 5 MG tablet Take 5 mg by mouth daily. Takes at lunch     No current facility-administered medications for this visit.     Family History  Problem Relation Age of Onset  . Lymphoma Brother   . Hypertension Father   . Stroke Father   . Diabetes Father 90       on po meds  . Diabetes Mother 90       on insulin  . Heart disease Brother        heart stent  . Thyroid disease Son   . Diabetes Sister        maternal aunt  . Colon cancer Neg  Hx     Review of Systems  All other systems reviewed and are negative.   Exam:   BP 116/64 (BP Location: Left Arm, Patient Position: Sitting, Cuff Size: Normal)   Pulse 72   Temp (!) 96.9 F (36.1 C) (Temporal)   Resp 12   Ht 5' 2.25" (1.581 m)   Wt 148 lb 12.8 oz (67.5 kg)   LMP 01/08/2009   BMI 27.00 kg/m      Height: 5' 2.25" (158.1 cm)  Ht Readings from Last 3 Encounters:  11/25/18 5' 2.25" (1.581 m)  08/27/18 5\' 3"  (1.6 m)  08/11/18 5' 2.25" (1.581 m)    General appearance: alert, cooperative and appears stated age Head: Normocephalic, without obvious abnormality, atraumatic Neck: no adenopathy, supple, symmetrical, trachea midline and thyroid normal to inspection and palpation Lungs: clear to auscultation bilaterally Breasts: normal appearance, no masses or tenderness Heart: regular rate and rhythm Abdomen: soft, non-tender; bowel sounds normal; no masses,  no organomegaly Extremities: extremities normal, atraumatic, no cyanosis or edema Skin: Skin color, texture, turgor normal. No rashes or lesions Lymph nodes: Cervical, supraclavicular, and axillary nodes normal. No abnormal inguinal nodes palpated Neurologic: Grossly normal   Pelvic: External genitalia:  no lesions              Urethra:  normal appearing urethra with no masses, tenderness or lesions              Bartholins and Skenes: normal                 Vagina: normal appearing vagina with normal color and discharge, no lesions              Cervix: no lesions              Pap taken: No. Bimanual Exam:  Uterus:  normal size, contour, position, consistency, mobility, non-tender              Adnexa: normal adnexa and no mass, fullness, tenderness               Rectovaginal: Confirms               Anus:  normal sphincter tone, no lesions  Chaperone was present for exam.  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  Doing 3D.  Have requested bopy of MMG from [facility] pap smear neg 2019.  Not indicated today Lab work will be done at the end of December with Dr Stephanie Acre BMD order signed and faxed to [facility] RF for Clobetasol, estradiol cream and inhaler to pharmacy for pt Colonoscopy is UTD return annually or prn

## 2018-12-01 ENCOUNTER — Encounter: Payer: Self-pay | Admitting: Obstetrics & Gynecology

## 2018-12-11 ENCOUNTER — Ambulatory Visit: Payer: Medicare Other | Admitting: Obstetrics & Gynecology

## 2019-02-11 ENCOUNTER — Telehealth: Payer: Self-pay

## 2019-02-11 NOTE — Telephone Encounter (Signed)
Received [facility]ed message from [facility] stating patient has multiple rescue inhalers but no controller medication. They are asking if patient Rx would be appropriate for asthma therapy.  Called patient and left message for her to return my call. Patient was to establish care with new PCP--Dr.Wolters in December and this should go to her. [facility]vised [facility].

## 2019-02-11 NOTE — Telephone Encounter (Signed)
Spoke with patient and advised any asthma medication should go through Dr.Wolters. Patient states she has been receiving meds from Dr.Wolters and not sure why [facility] sent [facility] to [facility]. Advised I would notify [facility] to [facility]. she thanked me for calling.  [facility] and he will forward Rx information to Dr.Wolters.

## 2019-03-03 ENCOUNTER — Ambulatory Visit: Payer: Medicare Other

## 2019-04-21 ENCOUNTER — Other Ambulatory Visit: Payer: Self-pay | Admitting: Obstetrics & Gynecology

## 2019-04-21 NOTE — Telephone Encounter (Signed)
Medication refill request: Estrace Last AEX:  11/25/2018 Next AEX: 04/26/2020 MM Last MMG (if hormonal medication request): 11/05/2018 BI-RADS Category 2 Benign  Refill authorized: 42.5 g, 2RF pended.  Please advise and refill if appropriate.

## 2019-05-04 IMAGING — DX DG CHEST 2V
2 series · 2 of 2 positions shown · non-contrast
Comparison: 06/29/2016

CLINICAL DATA: Right lower lobe pneumonia.

EXAM:
CHEST  2 VIEW

[dg chest 2 view (1 of 2)]
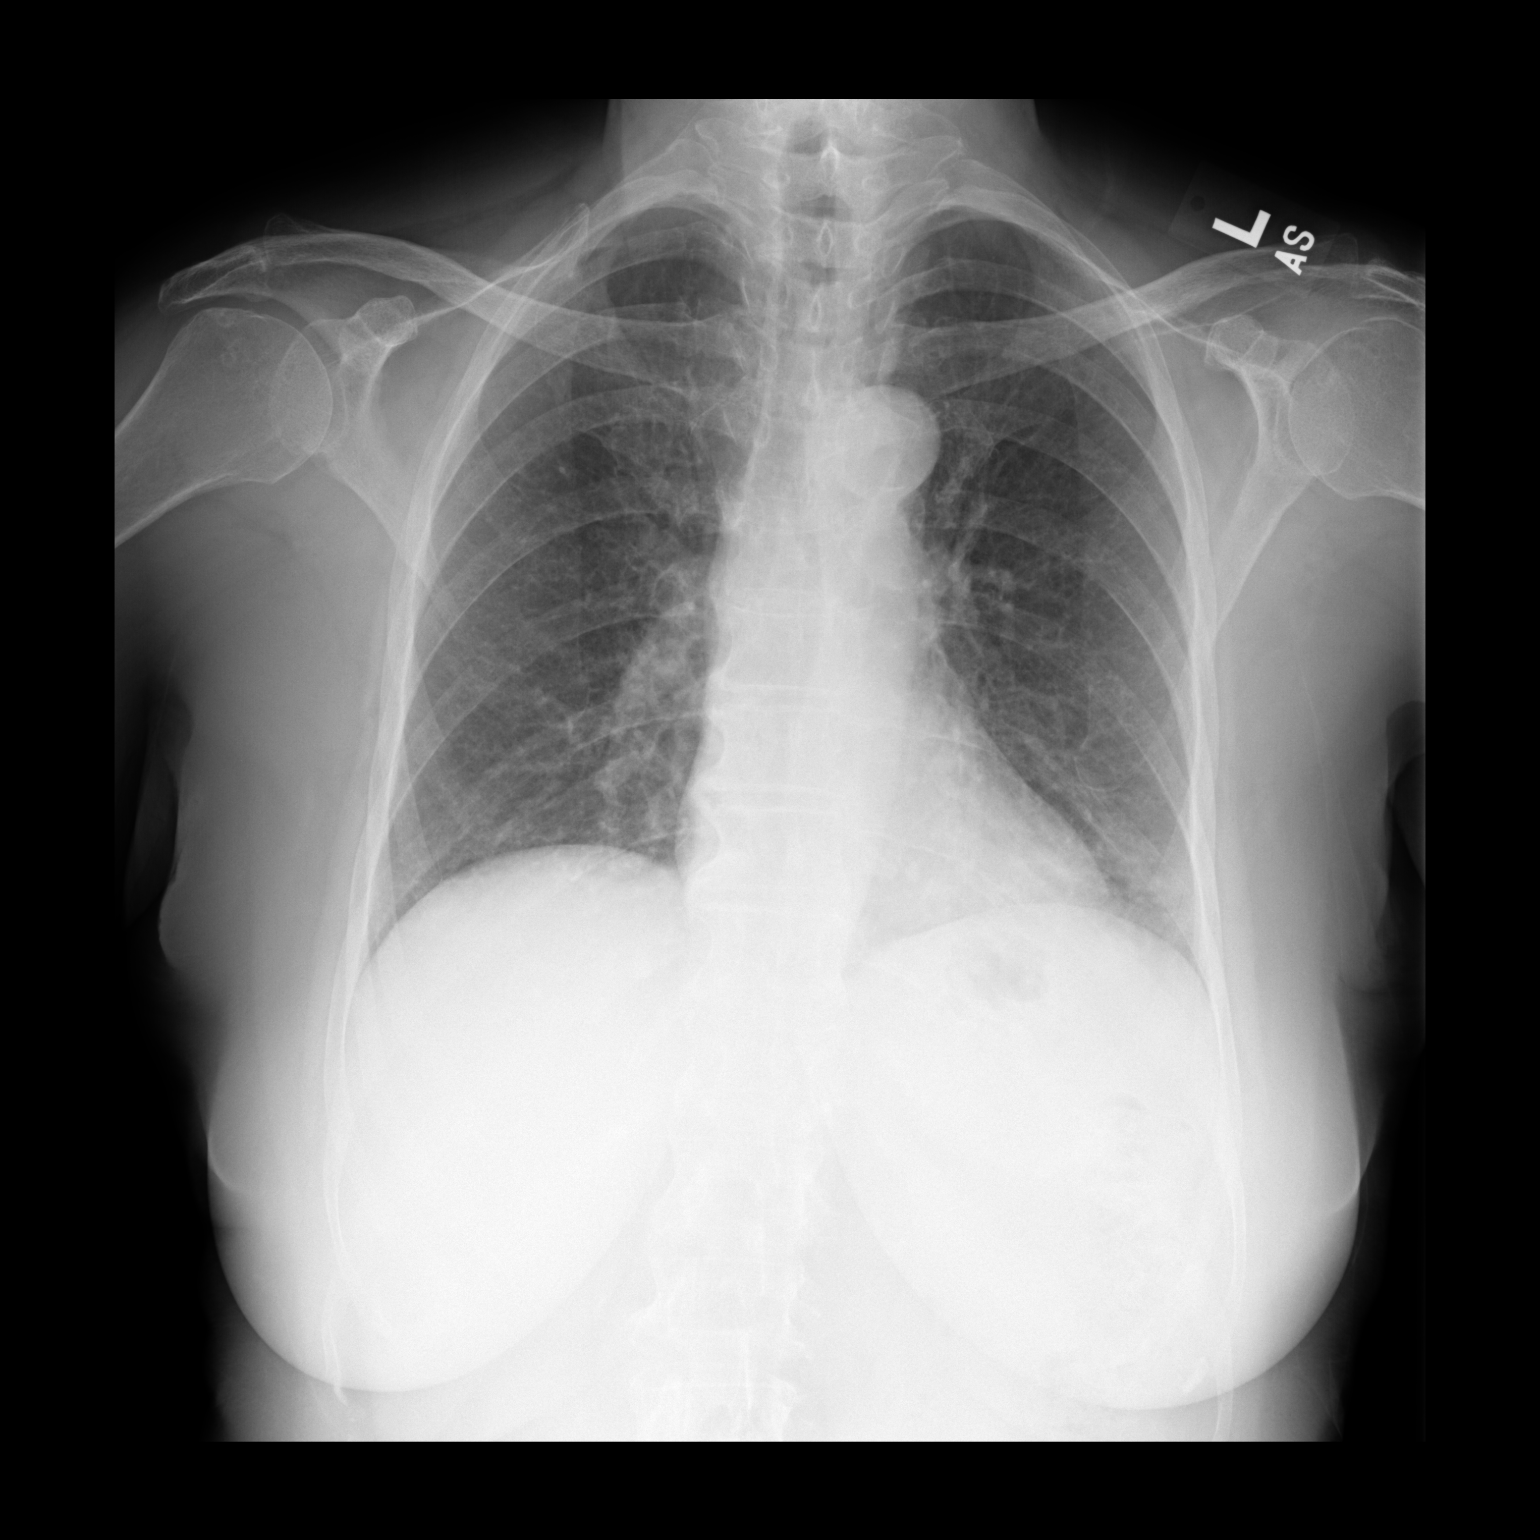

[dg chest 2 view (2 of 2)]
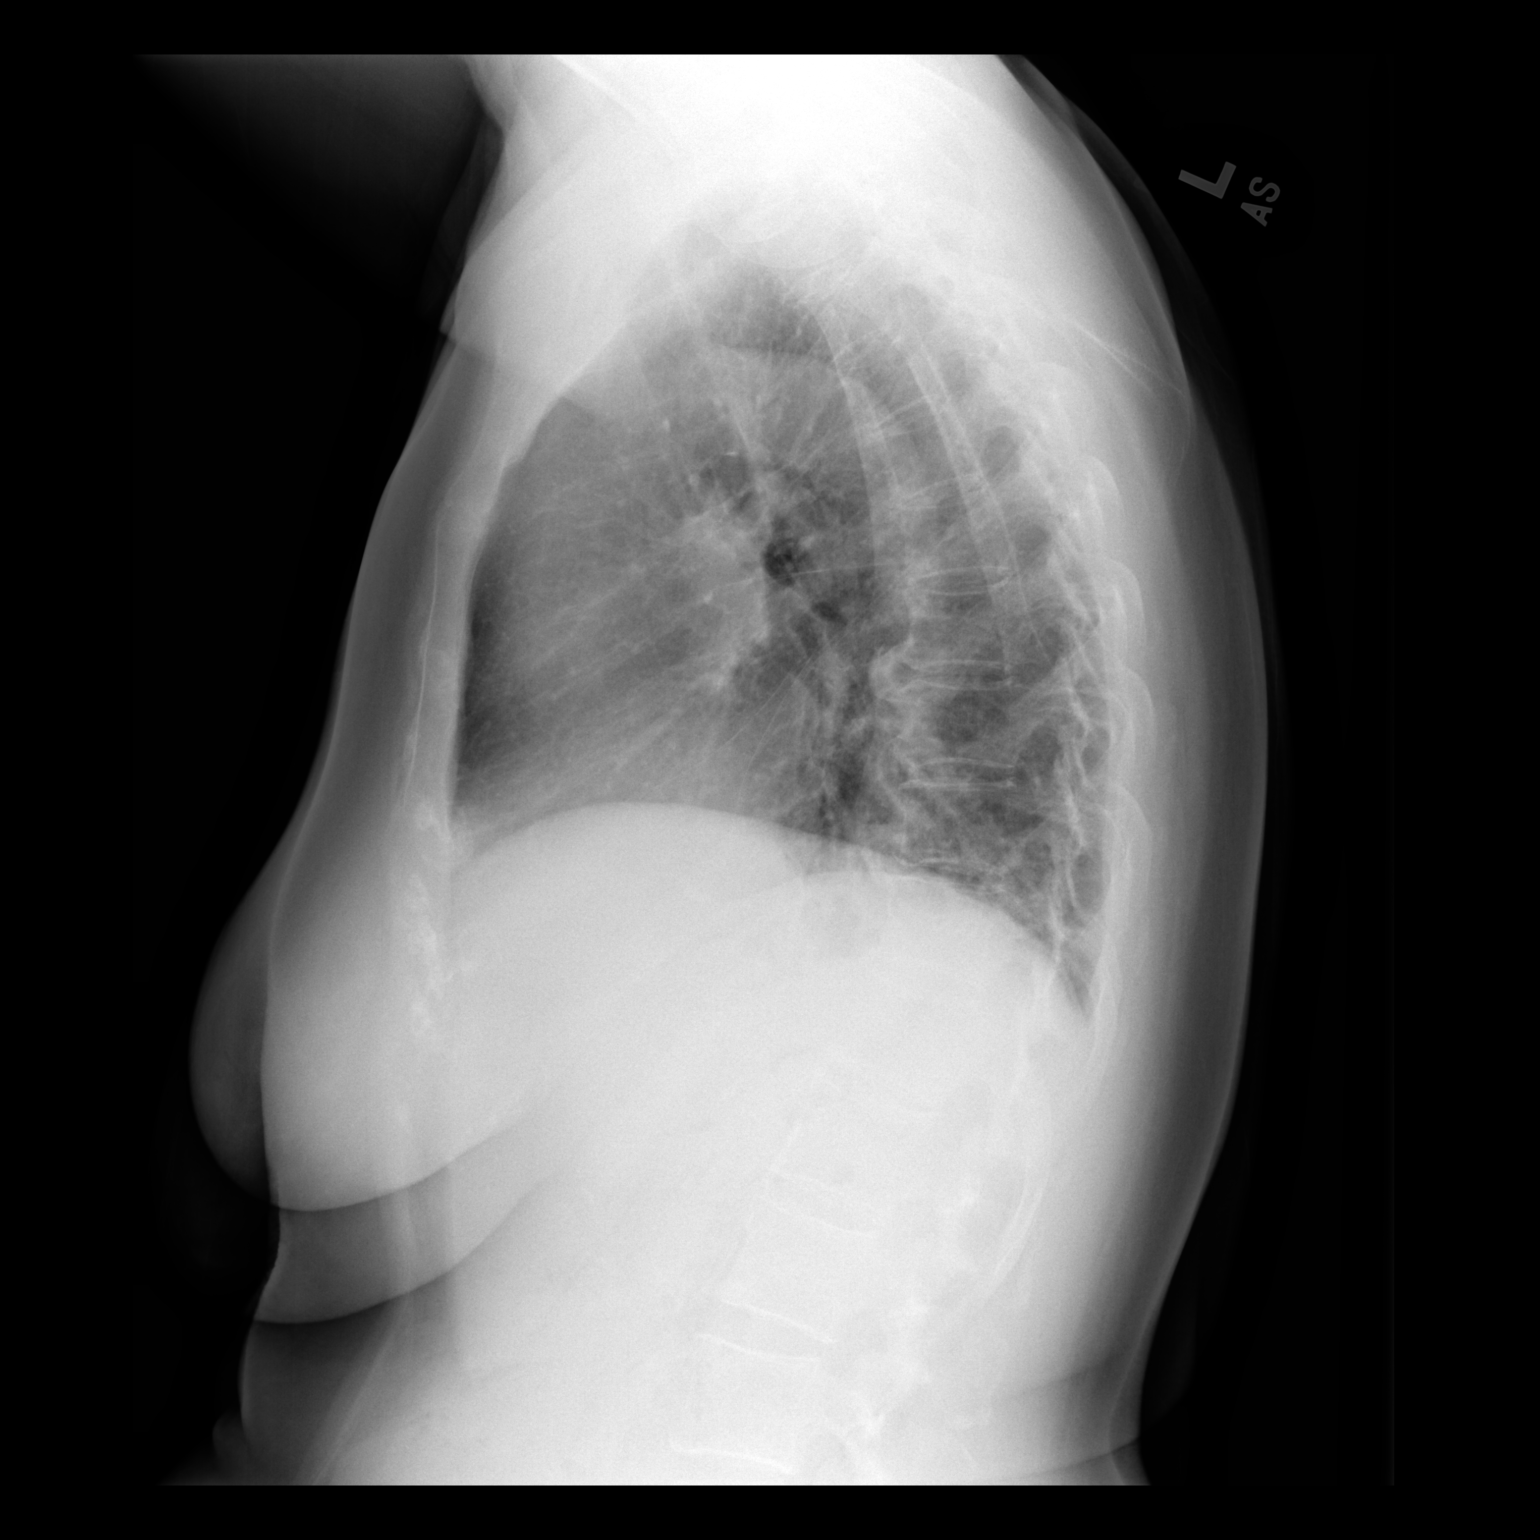

[2 of 2 positions shown; findings below may reference images not displayed]

FINDINGS: The small patchy area of infiltrate present at the right lung base
the prior study has cleared. There is slight new atelectasis at the
left base laterally. No consolidative infiltrate. No effusions.

Heart size is normal. Calcification in the thoracic aorta. No acute
bone abnormalities.
IMPRESSION: 1. Clearing of the small patchy infiltrate in the right lower lobe.
2. Minimal atelectasis left lung base.
3. Aortic atherosclerosis.

## 2019-06-16 ENCOUNTER — Ambulatory Visit: Payer: Medicare PPO | Admitting: Sports Medicine

## 2019-06-17 ENCOUNTER — Ambulatory Visit: Payer: Medicare PPO | Admitting: Podiatry

## 2019-06-23 ENCOUNTER — Ambulatory Visit: Payer: Medicare PPO | Admitting: Sports Medicine

## 2019-06-30 ENCOUNTER — Encounter: Payer: Self-pay | Admitting: Sports Medicine

## 2019-06-30 ENCOUNTER — Ambulatory Visit: Payer: Medicare PPO | Admitting: Sports Medicine

## 2019-06-30 ENCOUNTER — Other Ambulatory Visit: Payer: Self-pay

## 2019-06-30 DIAGNOSIS — M2041 Other hammer toe(s) (acquired), right foot: Secondary | ICD-10-CM

## 2019-06-30 DIAGNOSIS — M79675 Pain in left toe(s): Secondary | ICD-10-CM

## 2019-06-30 DIAGNOSIS — M2042 Other hammer toe(s) (acquired), left foot: Secondary | ICD-10-CM | POA: Diagnosis not present

## 2019-06-30 DIAGNOSIS — M79674 Pain in right toe(s): Secondary | ICD-10-CM

## 2019-06-30 DIAGNOSIS — B351 Tinea unguium: Secondary | ICD-10-CM | POA: Diagnosis not present

## 2019-06-30 NOTE — Progress Notes (Signed)
Subjective: Catherine [name] is a 69 y.o. female patient seen today in office with complaint of mildly painful thickened and discolored nails. Patient is desiring treatment for nail changes; has tried OTC topicals/Medication in the past with no improvement but does report when she was on Lamisil by mouth many years ago had improvement but slowly the changes have returned. Reports that nails are becoming difficult to manage because of the thickness and is desiring to discuss treatment options for nail fungus. Patient has no other pedal complaints at this time.   Admits family history father and son with nail fungus as well.  Review of Systems  All other systems reviewed and are negative.    Patient Active Problem List   Diagnosis Date Noted  . Iron deficiency 08/23/2016  . Iron deficiency anemia 05/23/2016  . LLQ pain 05/02/2012  . Osteopenia   . GERD (gastroesophageal reflux disease)   . Hyperplastic polyps of stomach   . Gastritis   . Hypothyroidism   . DIABETES MELLITUS 12/11/2007  . BARRETTS ESOPHAGUS 12/11/2007  . DIVERTICULOSIS, COLON 12/11/2007  . IRRITABLE BOWEL SYNDROME 12/11/2007  . ANAL FISSURE, HX OF 12/11/2007  . HYPERLIPIDEMIA 04/11/2007  . ANXIETY STATE, UNSPECIFIED 04/11/2007  . ALLERGIC  RHINITIS 04/11/2007  . Unspecified asthma(493.90) 04/11/2007  . G E REFLUX 04/11/2007    Current Outpatient Medications on File Prior to Visit  Medication Sig Dispense Refill  . albuterol (PROAIR HFA) 108 (90 Base) MCG/ACT inhaler Inhale 1-2 puffs into the lungs every 6 (six) hours as needed. 8 g 2  . clobetasol ointment (TEMOVATE) 0.05 % Apply a small amount externally twice daily for up to 5 days. 60 g 1  . desoximetasone (TOPICORT) 0.25 % cream daily.    Marland Kitchen esomeprazole (NEXIUM) 40 MG capsule Take 1 capsule (40 mg total) by mouth 2 (two) times daily. (Patient taking differently: Take 40 mg by mouth daily. ) 60 capsule 10  . estradiol (ESTRACE) 0.1 MG/GM vaginal cream USE PEA  SIZED AMOUNT OF CREAM EXTERNALLY TWO TIMES WEEKLY 42.5 g 2  . fexofenadine (ALLEGRA) 180 MG tablet Take 180 mg by mouth at bedtime as needed.    . fluticasone (FLONASE) 50 MCG/ACT nasal spray daily as needed.  11  . Iron-FA-B Cmp-C-Biot-Probiotic (FUSION PLUS) CAPS Take 1 capsule by mouth daily. (Patient taking differently: Take 1 capsule by mouth 3 (three) times a week. ) 30 capsule 1  . levothyroxine (SYNTHROID, LEVOTHROID) 25 MCG tablet Take 25 mcg by mouth as directed. 2 tabs mon-fri then 1 tab on sat and sun    . lidocaine (XYLOCAINE) 5 % ointment Apply topically daily as needed.    . meclizine (ANTIVERT) 25 MG tablet Take 1 tablet by mouth every 8 (eight) hours as needed.    . metFORMIN (GLUCOPHAGE) 500 MG tablet Take 500 mg by mouth daily with breakfast.     . simvastatin (ZOCOR) 5 MG tablet Take 5 mg by mouth daily. Takes at lunch     No current facility-administered medications on file prior to visit.    Allergies  Allergen Reactions  . Chocolate Hives and Itching  . Erythromycin Diarrhea  . Pneumococcal Vaccines     Fever   . Prednisone   . Shrimp [Shellfish Allergy]     hives  . Sulfa Antibiotics     Objective: Physical Exam  General: Well developed, nourished, no acute distress, awake, alert and oriented x 3  Vascular: Dorsalis pedis artery 2/4 bilateral, Posterior tibial artery 1/4 bilateral,  skin temperature warm to warm proximal to distal bilateral lower extremities, no varicosities, pedal hair present bilateral.  Neurological: Gross sensation present via light touch bilateral.   Dermatological: Skin is warm, dry, and supple bilateral, Nails 1-10 are tender, mildly elongated thick, and discolored with mild subungal debris, no webspace macerations present bilateral, no open lesions present bilateral, minimal corns noted bilateral fifth toes. No signs of infection bilateral.  Musculoskeletal: Hammertoe boney deformities noted bilateral. Muscular strength within normal  limits without painon range of motion. No pain with calf compression bilateral.  Assessment and Plan:  Problem List Items Addressed This Visit    None    Visit Diagnoses    Pain due to onychomycosis of toenails of both feet    -  Primary   Hammer toes of both feet          -Examined patient -Discussed treatment options for painful dystrophic nails and educated patient on possible mechanical versus genetic component of fungus as well -Mechanically debrided nails and no additional charge using a sterile nail nipper and smoothed with dermal without incident -Patient elects at this time to try laser as well as formula 7 advised patient to try this option since she does not want to restart Lamisil as she has had years previous for nail fungus -Advised good supportive shoes daily for foot type -Patient to return as scheduled for laser for nail fungus.  Landis Martins, DPM

## 2019-08-14 ENCOUNTER — Ambulatory Visit (INDEPENDENT_AMBULATORY_CARE_PROVIDER_SITE_OTHER): Payer: Medicare PPO | Admitting: *Deleted

## 2019-08-14 ENCOUNTER — Other Ambulatory Visit: Payer: Self-pay

## 2019-08-14 DIAGNOSIS — M79674 Pain in right toe(s): Secondary | ICD-10-CM

## 2019-08-14 DIAGNOSIS — B351 Tinea unguium: Secondary | ICD-10-CM

## 2019-08-14 DIAGNOSIS — M79675 Pain in left toe(s): Secondary | ICD-10-CM

## 2019-08-14 NOTE — Patient Instructions (Signed)

## 2019-08-14 NOTE — Progress Notes (Signed)
Patient presents today for the 1st laser treatment. Diagnosed with mycotic nail infection by Dr. Cannon Kettle.   Toenail most affected are all ten, but mildly discolored and thick.  All other systems are negative.  Nails were filed thin. Laser therapy was administered to 1-5 toenails bilateral and patient tolerated the treatment well. All safety precautions were in place.   She is also using Formula 7 nightly.   Follow up in 4 weeks for laser # 2.  Picture of nails taken today to document visual progress

## 2019-09-11 ENCOUNTER — Other Ambulatory Visit: Payer: Medicare PPO

## 2019-09-28 ENCOUNTER — Other Ambulatory Visit: Payer: Self-pay

## 2019-09-28 ENCOUNTER — Ambulatory Visit (INDEPENDENT_AMBULATORY_CARE_PROVIDER_SITE_OTHER): Payer: Medicare PPO | Admitting: *Deleted

## 2019-09-28 DIAGNOSIS — M79674 Pain in right toe(s): Secondary | ICD-10-CM

## 2019-09-28 DIAGNOSIS — M79675 Pain in left toe(s): Secondary | ICD-10-CM

## 2019-09-28 DIAGNOSIS — B351 Tinea unguium: Secondary | ICD-10-CM

## 2019-09-28 NOTE — Progress Notes (Signed)
Patient presents today for the 2nd laser treatment. Diagnosed with mycotic nail infection by Dr. Cannon Kettle.   Toenail most affected are all ten, but mildly discolored and thick.  All other systems are negative.  Nails were filed thin. Laser therapy was administered to 1-5 toenails bilateral and patient tolerated the treatment well. All safety precautions were in place.   She is continuing to use Formula 7 nightly.   Follow up in 4 weeks for laser # 3.

## 2019-10-09 ENCOUNTER — Other Ambulatory Visit: Payer: Medicare PPO

## 2019-10-30 ENCOUNTER — Other Ambulatory Visit: Payer: Medicare PPO

## 2019-11-09 ENCOUNTER — Other Ambulatory Visit: Payer: Self-pay

## 2019-11-09 ENCOUNTER — Ambulatory Visit (INDEPENDENT_AMBULATORY_CARE_PROVIDER_SITE_OTHER): Payer: Medicare PPO | Admitting: *Deleted

## 2019-11-09 DIAGNOSIS — M79674 Pain in right toe(s): Secondary | ICD-10-CM

## 2019-11-09 DIAGNOSIS — M79675 Pain in left toe(s): Secondary | ICD-10-CM

## 2019-11-09 DIAGNOSIS — B351 Tinea unguium: Secondary | ICD-10-CM

## 2019-11-09 NOTE — Progress Notes (Signed)
Patient presents today for the 3rd laser treatment. Diagnosed with mycotic nail infection by Dr. Cannon Kettle.   Toenail most affected are all ten, but mildly discolored and thick. New nail growth starting to show.  All other systems are negative.  Nails were filed thin. Laser therapy was administered to 1-5 toenails bilateral and patient tolerated the treatment well. All safety precautions were in place.   She is continuing to use Formula 7 nightly.   Follow up in 6 weeks for laser # 4.

## 2019-11-13 ENCOUNTER — Other Ambulatory Visit: Payer: Medicare PPO

## 2019-11-16 ENCOUNTER — Telehealth: Payer: Self-pay

## 2019-11-16 NOTE — Telephone Encounter (Signed)
Bone density reviewed in detail with patient as written by provider. Patient verbalizes understanding and is agreeable. Okay to close encounter.

## 2019-11-17 ENCOUNTER — Encounter: Payer: Self-pay | Admitting: Obstetrics & Gynecology

## 2019-11-22 ENCOUNTER — Telehealth: Payer: Self-pay | Admitting: Obstetrics and Gynecology

## 2019-11-23 ENCOUNTER — Encounter: Payer: Self-pay | Admitting: Obstetrics & Gynecology

## 2019-11-27 ENCOUNTER — Other Ambulatory Visit: Payer: Medicare PPO

## 2019-12-10 ENCOUNTER — Other Ambulatory Visit: Payer: Self-pay | Admitting: Obstetrics & Gynecology

## 2019-12-11 ENCOUNTER — Other Ambulatory Visit: Payer: Medicare PPO

## 2019-12-17 ENCOUNTER — Other Ambulatory Visit: Payer: Self-pay | Admitting: Radiology

## 2019-12-22 ENCOUNTER — Encounter: Payer: Self-pay | Admitting: Radiology

## 2019-12-24 ENCOUNTER — Other Ambulatory Visit: Payer: Self-pay | Admitting: Family Medicine

## 2019-12-24 ENCOUNTER — Ambulatory Visit
Admission: RE | Admit: 2019-12-24 | Discharge: 2019-12-24 | Disposition: A | Discharge status: Home / Self Care | Payer: Medicare PPO | Source: Ambulatory Visit | Attending: Family Medicine | Admitting: Family Medicine

## 2019-12-24 DIAGNOSIS — J4520 Mild intermittent asthma, uncomplicated: Secondary | ICD-10-CM

## 2019-12-25 ENCOUNTER — Other Ambulatory Visit: Payer: Medicare PPO

## 2019-12-28 NOTE — Telephone Encounter (Signed)
Encounter closed

## 2020-01-11 ENCOUNTER — Ambulatory Visit: Payer: Medicare Other | Admitting: Obstetrics & Gynecology

## 2020-01-15 ENCOUNTER — Other Ambulatory Visit: Payer: Medicare PPO

## 2020-01-18 ENCOUNTER — Other Ambulatory Visit: Payer: Medicare PPO

## 2020-01-22 ENCOUNTER — Other Ambulatory Visit: Payer: Medicare PPO

## 2020-02-01 ENCOUNTER — Other Ambulatory Visit: Payer: Medicare PPO

## 2020-02-15 ENCOUNTER — Other Ambulatory Visit: Payer: Medicare PPO

## 2020-02-22 ENCOUNTER — Other Ambulatory Visit: Payer: Self-pay | Admitting: General Surgery

## 2020-02-23 ENCOUNTER — Other Ambulatory Visit: Payer: Self-pay | Admitting: General Surgery

## 2020-02-23 DIAGNOSIS — R928 Other abnormal and inconclusive findings on diagnostic imaging of breast: Secondary | ICD-10-CM

## 2020-02-29 ENCOUNTER — Ambulatory Visit (INDEPENDENT_AMBULATORY_CARE_PROVIDER_SITE_OTHER): Payer: Medicare PPO

## 2020-02-29 ENCOUNTER — Other Ambulatory Visit: Payer: Self-pay

## 2020-02-29 DIAGNOSIS — M79675 Pain in left toe(s): Secondary | ICD-10-CM

## 2020-02-29 DIAGNOSIS — M79674 Pain in right toe(s): Secondary | ICD-10-CM

## 2020-02-29 DIAGNOSIS — B351 Tinea unguium: Secondary | ICD-10-CM

## 2020-02-29 NOTE — Progress Notes (Signed)
Patient presents today for the 4th laser treatment. Diagnosed with mycotic nail infection by Dr. Cannon Kettle.   Toenail most affected are all ten, but mildly discolored and thick. New nail growth starting to show.  All other systems are negative.  Nails were filed thin. Laser therapy was administered to 1-5 toenails bilateral and patient tolerated the treatment well. All safety precautions were in place.   She is continuing to use Formula 7 nightly.   Follow up in 6 weeks for laser # 5.

## 2020-03-17 ENCOUNTER — Encounter: Payer: Self-pay | Admitting: Sports Medicine

## 2020-03-17 ENCOUNTER — Ambulatory Visit: Payer: Medicare PPO | Admitting: Sports Medicine

## 2020-03-17 ENCOUNTER — Other Ambulatory Visit: Payer: Self-pay

## 2020-03-17 DIAGNOSIS — M79675 Pain in left toe(s): Secondary | ICD-10-CM

## 2020-03-17 DIAGNOSIS — M79674 Pain in right toe(s): Secondary | ICD-10-CM | POA: Diagnosis not present

## 2020-03-17 DIAGNOSIS — B351 Tinea unguium: Secondary | ICD-10-CM | POA: Diagnosis not present

## 2020-03-17 DIAGNOSIS — M2042 Other hammer toe(s) (acquired), left foot: Secondary | ICD-10-CM

## 2020-03-17 DIAGNOSIS — M2041 Other hammer toe(s) (acquired), right foot: Secondary | ICD-10-CM | POA: Diagnosis not present

## 2020-03-17 MED ORDER — NEOMYCIN-POLYMYXIN-HC 3.5-10000-1 OT SOLN: OTIC | 0 refills | Status: DC

## 2020-03-17 NOTE — Progress Notes (Signed)
Subjective: Catherine [name] is a 70 y.o. female patient seen today in office with complaint of mildly pain at right second toe.  Patient reports that over the last few weeks the toe has gotten more more painful states that there is no significant redness but has noticed a little bit of puffiness and reports that she has been using her topical formula 3 to the area but has some soreness when she tries to wear her winter shoes.  Patient states that she is going for laser treatment without any issues.  Patient Active Problem List   Diagnosis Date Noted  . Iron deficiency 08/23/2016  . Iron deficiency anemia 05/23/2016  . LLQ pain 05/02/2012  . Osteopenia   . GERD (gastroesophageal reflux disease)   . Hyperplastic polyps of stomach   . Gastritis   . Hypothyroidism   . DIABETES MELLITUS 12/11/2007  . BARRETTS ESOPHAGUS 12/11/2007  . DIVERTICULOSIS, COLON 12/11/2007  . IRRITABLE BOWEL SYNDROME 12/11/2007  . ANAL FISSURE, HX OF 12/11/2007  . HYPERLIPIDEMIA 04/11/2007  . ANXIETY STATE, UNSPECIFIED 04/11/2007  . ALLERGIC  RHINITIS 04/11/2007  . Unspecified asthma(493.90) 04/11/2007  . G E REFLUX 04/11/2007    Current Outpatient Medications on File Prior to Visit  Medication Sig Dispense Refill  . albuterol (PROAIR HFA) 108 (90 Base) MCG/ACT inhaler Inhale 1-2 puffs into the lungs every 6 (six) hours as needed. 8 g 2  . clobetasol ointment (TEMOVATE) 0.05 % Apply a small amount externally twice daily for up to 5 days. 60 g 1  . desoximetasone (TOPICORT) 0.25 % cream daily.    Marland Kitchen esomeprazole (NEXIUM) 40 MG capsule Take 1 capsule (40 mg total) by mouth 2 (two) times daily. (Patient taking differently: Take 40 mg by mouth daily. ) 60 capsule 10  . estradiol (ESTRACE) 0.1 MG/GM vaginal cream USE PEA SIZED AMOUNT OF CREAM EXTERNALLY TWO TIMES WEEKLY 42.5 g 2  . fexofenadine (ALLEGRA) 180 MG tablet Take 180 mg by mouth at bedtime as needed.    . fluticasone (FLONASE) 50 MCG/ACT nasal spray daily  as needed.  11  . Iron-FA-B Cmp-C-Biot-Probiotic (FUSION PLUS) CAPS Take 1 capsule by mouth daily. (Patient taking differently: Take 1 capsule by mouth 3 (three) times a week. ) 30 capsule 1  . levothyroxine (SYNTHROID, LEVOTHROID) 25 MCG tablet Take 25 mcg by mouth as directed. 2 tabs mon-fri then 1 tab on sat and sun    . lidocaine (XYLOCAINE) 5 % ointment Apply topically daily as needed.    . meclizine (ANTIVERT) 25 MG tablet Take 1 tablet by mouth every 8 (eight) hours as needed.    . metFORMIN (GLUCOPHAGE) 500 MG tablet Take 500 mg by mouth daily with breakfast.     . simvastatin (ZOCOR) 5 MG tablet Take 5 mg by mouth daily. Takes at lunch     No current facility-administered medications on file prior to visit.    Allergies  Allergen Reactions  . Chocolate Hives and Itching  . Erythromycin Diarrhea  . Pneumococcal Vaccines     Fever   . Prednisone   . Shrimp [Shellfish Allergy]     hives  . Sulfa Antibiotics     Objective: Physical Exam  General: Well developed, nourished, no acute distress, awake, alert and oriented x 3  Vascular: Dorsalis pedis artery 2/4 bilateral, Posterior tibial artery 1/4 bilateral, skin temperature warm to warm proximal to distal bilateral lower extremities, no varicosities, pedal hair present bilateral.  Neurological: Gross sensation present via light touch bilateral.  Dermatological: Skin is warm, dry, and supple bilateral, right second toenail is thickened with mild incurvation with no significant redness drainage odor or warmth there is very minimal swelling at the nail folds no open lesions present bilateral, minimal corns noted bilateral fifth toes. No signs of infection bilateral.  Musculoskeletal: Hammertoe boney deformities noted bilateral. Muscular strength within normal limits without painon range of motion. No pain with calf compression bilateral.  Assessment and Plan:  Problem List Items Addressed This Visit   None   Visit Diagnoses     Pain around toenail, right foot    -  Primary   Pain due to onychomycosis of toenails of both feet       Hammer toes of both feet          -Examined patient -Mechanically debrided right second toenail removing offending borders there was no active drainage so there was no need for an acute ingrown nail procedure at this time but did advise patient because her second toe was really long and the nail is thick likely this could be creating pressure so at this time to stop using formula 3 at the toe and to start soaking and using Corticosporin solution as directed until symptoms have gotten better -Dispensed toe cap for patient to use daily at right second toe until symptoms have improved -Advised good supportive shoes daily for foot type -Patient to return as scheduled in 2 weeks for toe check and advised patient that likely she can still continue with laser as long as her toe is doing well with no pain Landis Martins, DPM

## 2020-03-19 ENCOUNTER — Other Ambulatory Visit (HOSPITAL_COMMUNITY): Payer: Medicare PPO

## 2020-03-31 ENCOUNTER — Ambulatory Visit: Payer: Medicare PPO | Admitting: Sports Medicine

## 2020-04-08 ENCOUNTER — Encounter (HOSPITAL_COMMUNITY): Payer: Self-pay

## 2020-04-08 ENCOUNTER — Other Ambulatory Visit: Payer: Self-pay

## 2020-04-08 ENCOUNTER — Other Ambulatory Visit (HOSPITAL_COMMUNITY)
Admission: RE | Admit: 2020-04-08 | Discharge: 2020-04-08 | Disposition: A | Discharge status: Home / Self Care | Payer: Medicare PPO | Source: Ambulatory Visit | Attending: General Surgery | Admitting: General Surgery

## 2020-04-08 ENCOUNTER — Encounter (HOSPITAL_COMMUNITY)
Admission: RE | Admit: 2020-04-08 | Discharge: 2020-04-08 | Disposition: A | Discharge status: Home / Self Care | Payer: Medicare PPO | Source: Ambulatory Visit | Attending: General Surgery | Admitting: General Surgery

## 2020-04-08 ENCOUNTER — Ambulatory Visit: Payer: Self-pay | Admitting: Cardiology

## 2020-04-08 DIAGNOSIS — Z20822 Contact with and (suspected) exposure to covid-19: Secondary | ICD-10-CM | POA: Insufficient documentation

## 2020-04-08 DIAGNOSIS — Z01818 Encounter for other preprocedural examination: Secondary | ICD-10-CM | POA: Insufficient documentation

## 2020-04-08 DIAGNOSIS — Z01812 Encounter for preprocedural laboratory examination: Secondary | ICD-10-CM | POA: Insufficient documentation

## 2020-04-08 HISTORY — DX: Unspecified osteoarthritis, unspecified site: M19.90

## 2020-04-08 LAB — BASIC METABOLIC PANEL
Anion gap: 5 (ref 5–15)
BUN: 16 mg/dL (ref 8–23)
CO2: 26 mmol/L (ref 22–32)
Calcium: 9.2 mg/dL (ref 8.9–10.3)
Chloride: 103 mmol/L (ref 98–111)
Creatinine, Ser: 0.74 mg/dL (ref 0.44–1.00)
GFR, Estimated: >60 mL/min (ref >60)
Glucose, Bld: 112 mg/dL — ABNORMAL HIGH (ref 70–99)
Potassium: 3.8 mmol/L (ref 3.5–5.1)
Sodium: 134 mmol/L — ABNORMAL LOW (ref 135–145)

## 2020-04-08 LAB — CBC
HCT: 40 % (ref 36.0–46.0)
Hemoglobin: 12.7 g/dL (ref 12.0–15.0)
MCH: 28.5 pg (ref 26.0–34.0)
MCHC: 31.8 g/dL (ref 30.0–36.0)
MCV: 89.9 fL (ref 80.0–100.0)
Platelets: 263 10*3/uL (ref 150–400)
RBC: 4.45 MIL/uL (ref 3.87–5.11)
RDW: 14.5 % (ref 11.5–15.5)
WBC: 8.5 10*3/uL (ref 4.0–10.5)
nRBC: 0 % (ref 0.0–0.2)

## 2020-04-08 LAB — HEMOGLOBIN A1C
Hgb A1c MFr Bld: 6.2 % — ABNORMAL HIGH (ref 4.8–5.6)
Mean Plasma Glucose: 131.24 mg/dL

## 2020-04-08 NOTE — Progress Notes (Signed)
Surgical Instructions    Your procedure is scheduled on 04/12/20.  Report to [facility] Main Entrance "A" at 12:00 P.M., then check in with the Admitting office.  Call this number if you have problems the morning of surgery:  [phone]   If you have any questions prior to your surgery date call [phone]: Open Monday-Friday 8am-4pm    Remember:  Do not eat after midnight the night before your surgery  You may drink clear liquids until 11:00am the morning of your surgery.   Clear liquids allowed are: Water, Non-Citrus Juices (without pulp), Carbonated Beverages, Clear Tea, Black Coffee Only, and Gatorade    Take these medicines the morning of surgery with A SIP OF WATER  albuterol (PROAIR HFA) if needed (bring inhaler with you the day of surgery) esomeprazole (NEXIUM) fluticasone (FLONASE) if needed levothyroxine (SYNTHROID, LEVOTHROID) meclizine (ANTIVERT) if needed  DO NOT TAKE METFORMIN THE MORNING OF SURGERY!   As of today, STOP taking any Aspirin (unless otherwise instructed by your surgeon) Aleve, Naproxen, Ibuprofen, Motrin, Advil, Goody's, BC's, all herbal medications, fish oil, and all vitamins.     HOW TO MANAGE YOUR DIABETES BEFORE AND AFTER SURGERY  Why is it important to control my blood sugar before and after surgery? . Improving blood sugar levels before and after surgery helps healing and can limit problems. . A way of improving blood sugar control is eating a healthy diet by: o  Eating less sugar and carbohydrates o  Increasing activity/exercise o  Talking with your doctor about reaching your blood sugar goals . High blood sugars (greater than 180 mg/dL) can raise your risk of infections and slow your recovery, so you will need to focus on controlling your diabetes during the weeks before surgery. . Make sure that the doctor who takes care of your diabetes knows about your planned surgery including the date and location.  How do I manage my blood sugar  before surgery? . Check your blood sugar at least 4 times a day, starting 2 days before surgery, to make sure that the level is not too high or low. . Check your blood sugar the morning of your surgery when you wake up and every 2 hours until you get to the Short Stay unit. o If your blood sugar is less than 70 mg/dL, you will need to treat for low blood sugar: - Do not take insulin. - Treat a low blood sugar (less than 70 mg/dL) with  cup of clear juice (cranberry or apple), 4 glucose tablets, OR glucose gel. - Recheck blood sugar in 15 minutes after treatment (to make sure it is greater than 70 mg/dL). If your blood sugar is not greater than 70 mg/dL on recheck, call [phone] for further instructions. . Report your blood sugar to the short stay nurse when you get to Short Stay.  . If you are admitted to the hospital after surgery: o Your blood sugar will be checked by the staff and you will probably be given insulin after surgery (instead of oral diabetes medicines) to make sure you have good blood sugar levels. o The goal for blood sugar control after surgery is 80-180 mg/dL.                      Do not wear jewelry, make up, or nail polish            Do not wear lotions, powders, perfumes/colognes, or deodorant.  Do not shave 48 hours prior to surgery.              Do not bring valuables to the hospital.            [facility] is not responsible for any belongings or valuables.  Do NOT Smoke (Tobacco/Vaping) or drink Alcohol 24 hours prior to your procedure If you use a CPAP at night, you may bring all equipment for your overnight stay.   Contacts, glasses, dentures or bridgework may not be worn into surgery, please bring cases for these belongings   For patients admitted to the hospital, discharge time will be determined by your treatment team.   Patients discharged the day of surgery will not be allowed to drive home, and someone needs to stay with them for 24  hours.    Special instructions:   [place]- Preparing For Surgery  Before surgery, you can play an important role. Because skin is not sterile, your skin needs to be as free of germs as possible. You can reduce the number of germs on your skin by washing with CHG (chlorahexidine gluconate) Soap before surgery.  CHG is an antiseptic cleaner which kills germs and bonds with the skin to continue killing germs even after washing.    Oral Hygiene is also important to reduce your risk of infection.  Remember - BRUSH YOUR TEETH THE MORNING OF SURGERY WITH YOUR REGULAR TOOTHPASTE  Please do not use if you have an allergy to CHG or antibacterial soaps. If your skin becomes reddened/irritated stop using the CHG.  Do not shave (including legs and underarms) for at least 48 hours prior to first CHG shower. It is OK to shave your face.  Please follow these instructions carefully.   1. Shower the NIGHT BEFORE SURGERY and the MORNING OF SURGERY  2. If you chose to wash your hair, wash your hair first as usual with your normal shampoo.  3. After you shampoo, rinse your hair and body thoroughly to remove the shampoo.  4. Wash Face and genitals (private parts) with your normal soap.   5.  Shower the NIGHT BEFORE SURGERY and the MORNING OF SURGERY with CHG Soap.   6. Use CHG Soap as you would any other liquid soap. You can apply CHG directly to the skin and wash gently with a scrungie or a clean washcloth.   7. Apply the CHG Soap to your body ONLY FROM THE NECK DOWN.  Do not use on open wounds or open sores. Avoid contact with your eyes, ears, mouth and genitals (private parts). Wash Face and genitals (private parts)  with your normal soap.   8. Wash thoroughly, paying special attention to the area where your surgery will be performed.  9. Thoroughly rinse your body with warm water from the neck down.  10. DO NOT shower/wash with your normal soap after using and rinsing off the CHG Soap.  11. Pat  yourself dry with a CLEAN TOWEL.  12. Wear CLEAN PAJAMAS to bed the night before surgery  13. Place CLEAN SHEETS on your bed the night before your surgery  14. DO NOT SLEEP WITH PETS.   Day of Surgery: Take a shower with CHG soap.  Wear Clean/Comfortable clothing the morning of surgery Do not apply any deodorants/lotions.   Remember to brush your teeth WITH YOUR REGULAR TOOTHPASTE.   Please read over the following fact sheets that you were given.

## 2020-04-08 NOTE — Progress Notes (Signed)
PCP - Jonathon Jordan Dermatologist: Dr. Jarvis Newcomer Dr. Collene Mares- gastroenterolgist Endocrinologist: Dr. Jacelyn Pi  PPM/ICD - denies   Chest x-ray - n/a EKG - 04/08/20 Stress Test -denies  ECHO - denies Cardiac Cath - denies  Sleep Study - denies   Fasting Blood Sugar 100-150 Checks Blood Sugar every other day  Patient instructed to hold all Aspirin, NSAID's, herbal medications, fish oil and vitamins 7 days prior to surgery.   ERAS Protcol -yes   COVID TEST- 04/08/20   Anesthesia review: yes, seed placement   Patient denies shortness of breath, fever, cough and chest pain at PAT appointment   All instructions explained to the patient, with a verbal understanding of the material. Patient agrees to go over the instructions while at home for a better understanding. Patient also instructed to self quarantine after being tested for COVID-19. The opportunity to ask questions was provided.

## 2020-04-09 LAB — SARS CORONAVIRUS 2 (TAT 6-24 HRS): SARS Coronavirus 2: NEGATIVE

## 2020-04-11 NOTE — H&P (Signed)
[name] Location: [facility] Patient #: [id number] DOB: Oct 29, 1950 Married / Language: English / Race: Asian Female   History of Present Illness  The patient is a 70 year old female who presents with a complaint of Breast problems. Pt is a 70 yo F referred for abnormal left mammogram. She had screening detected calcifications. Diagnostic imaging was done at [facility] She was seen to have 2.1 cm of clustered heterogeneous calcs in the left breast UIQ. 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 She is referred for discordance. She does not have any family history of breast or ovarian cancer, just a brother who had lymphoma.  She has not had breast issues before.   Imaging was performed at [facility]. Reports reviewed.   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 MICROCALCIFICATIONS ARE ASSOCIATED WITH THESE PROCESSES.     Diagnostic Studies History  Colonoscopy  1-5 years ago Mammogram  within last year Pap Smear  1-5 years ago  Allergies  No Known Drug Allergies   Allergies Reconciled   Medication History Simvastatin (5MG  Tablet, Oral) Active. metFORMIN HCl ER (500MG  Tablet ER 24HR, Oral) Active. Levothyroxine Sodium (25MCG Tablet, Oral) Active. levoFLOXacin (500MG  Tablet, Oral) Active. Fusion Plus (Oral) Active. Shingrix (50MCG/0.5ML For Suspension, Intramuscular) Active. Esomeprazole Magnesium (40MG  Capsule DR, Oral) Active. Ipratropium-Albuterol (0.5-2.5 (3)MG/3ML Solution, Inhalation) Active. Medications Reconciled  Family History  Diabetes Mellitus  Mother, Sister. Hypertension  Father.  Pregnancy / Birth History Age at menarche  50 years. Gravida  3 Irregular periods  Length (months) of breastfeeding  3-6 Maternal age  6-25 Para  2  Other Problems Asthma  Diabetes Mellitus  Gastroesophageal Reflux Disease  Lump In  Breast  Thyroid Disease     Review of Systems General Not Present- Appetite Loss, Chills, Fatigue, Fever, Night Sweats, Weight Gain and Weight Loss. Skin Not Present- Change in Wart/Mole, Dryness, Hives, Jaundice, New Lesions, Non-Healing Wounds, Rash and Ulcer. HEENT Not Present- Earache, Hearing Loss, Hoarseness, Nose Bleed, Oral Ulcers, Ringing in the Ears, Seasonal Allergies, Sinus Pain, Sore Throat, Visual Disturbances, Wears glasses/contact lenses and Yellow Eyes. Respiratory Not Present- Bloody sputum, Chronic Cough, Difficulty Breathing, Snoring and Wheezing. Breast Not Present- Breast Mass, Breast Pain, Nipple Discharge and Skin Changes. Cardiovascular Not Present- Chest Pain, Difficulty Breathing Lying Down, Leg Cramps, Palpitations, Rapid Heart Rate, Shortness of Breath and Swelling of Extremities. Gastrointestinal Not Present- Abdominal Pain, Bloating, Bloody Stool, Change in Bowel Habits, Chronic diarrhea, Constipation, Difficulty Swallowing, Excessive gas, Gets full quickly at meals, Hemorrhoids, Indigestion, Nausea, Rectal Pain and Vomiting. Female Genitourinary Not Present- Frequency, Nocturia, Painful Urination, Pelvic Pain and Urgency. Musculoskeletal Not Present- Back Pain, Joint Pain, Joint Stiffness, Muscle Pain, Muscle Weakness and Swelling of Extremities. Neurological Not Present- Decreased Memory, Fainting, Headaches, Numbness, Seizures, Tingling, Tremor, Trouble walking and Weakness. Psychiatric Not Present- Anxiety, Bipolar, Change in Sleep Pattern, Depression, Fearful and Frequent crying. Endocrine Not Present- Cold Intolerance, Excessive Hunger, Hair Changes, Heat Intolerance, Hot flashes and New Diabetes. Hematology Not Present- Blood Thinners, Easy Bruising, Excessive bleeding, Gland problems, HIV and Persistent Infections. All other systems negative  Vitals  Weight: 147.5 lb Height: 62in Body Surface Area: 1.68 m Body Mass Index: 26.98 kg/m  Temp.:  97.20F  Pulse: 83 (Regular)  P.OX: 98% (Room air) BP: 120/78(Sitting, Left Arm, Standard)       Physical Exam General Mental Status-Alert. General Appearance-Consistent with stated age. Hydration-Well hydrated. Voice-Normal.  Head and  Neck Head-normocephalic, atraumatic with no lesions or palpable masses. Trachea-midline. Thyroid Gland Characteristics - normal size and consistency.  Eye Eyeball - Bilateral-Extraocular movements intact. Sclera/Conjunctiva - Bilateral-No scleral icterus.  Chest and Lung Exam Chest and lung exam reveals -quiet, even and easy respiratory effort with no use of accessory muscles and on auscultation, normal breath sounds, no adventitious sounds and normal vocal resonance. Inspection Chest Wall - Normal. Back - normal.  Breast Note: symmetric bilaterally. moderate ptosis. no palpable masses. no nipple retraction or skin dimpling. no nipple discharge. no LAD.   Cardiovascular Cardiovascular examination reveals -normal heart sounds, regular rate and rhythm with no murmurs and normal pedal pulses bilaterally.  Abdomen Inspection Inspection of the abdomen reveals - No Hernias. Palpation/Percussion Palpation and Percussion of the abdomen reveal - Soft, Non Tender, No Rebound tenderness, No Rigidity (guarding) and No hepatosplenomegaly. Auscultation Auscultation of the abdomen reveals - Bowel sounds normal.  Neurologic Neurologic evaluation reveals -alert and oriented x 3 with no impairment of recent or remote memory. Mental Status-Normal.  Musculoskeletal Global Assessment -Note: no gross deformities.  Normal Exam - Left-Upper Extremity Strength Normal and Lower Extremity Strength Normal. Normal Exam - Right-Upper Extremity Strength Normal and Lower Extremity Strength Normal.  Lymphatic Head & Neck  General Head & Neck Lymphatics: Bilateral - Description - Normal. Axillary  General Axillary Region:  Bilateral - Description - Normal. Tenderness - Non Tender. Femoral & Inguinal  Generalized Femoral & Inguinal Lymphatics: Bilateral - Description - No Generalized lymphadenopathy.    Assessment & Plan  ABNORMAL MAMMOGRAM OF LEFT BREAST (R92.8) Current Plans You are being scheduled for surgery- Our schedulers will call you.  You should hear from our office's scheduling department within 5 working days about the location, date, and time of surgery. We try to make accommodations for patient's preferences in scheduling surgery, but sometimes the OR schedule or the surgeon's schedule prevents [place] from making those accommodations.  If you have not heard from our office [phone]) in 5 working days, call the office and ask for your surgeon's nurse.  If you have other questions about your diagnosis, plan, or surgery, call the office and ask for your surgeon's nurse.  Pt Education - [facility] Breast Biopsy HCI: discussed with patient and provided information.

## 2020-04-12 ENCOUNTER — Ambulatory Visit (HOSPITAL_COMMUNITY): Payer: Medicare PPO | Admitting: Vascular Surgery

## 2020-04-12 ENCOUNTER — Encounter (HOSPITAL_COMMUNITY)
Admission: RE | Disposition: A | Discharge status: Home / Self Care | Payer: Self-pay | Source: Home / Self Care | Attending: General Surgery

## 2020-04-12 ENCOUNTER — Other Ambulatory Visit: Payer: Self-pay

## 2020-04-12 ENCOUNTER — Ambulatory Visit (HOSPITAL_COMMUNITY): Payer: Medicare PPO | Admitting: Anesthesiology

## 2020-04-12 ENCOUNTER — Ambulatory Visit (HOSPITAL_COMMUNITY)
Admission: RE | Admit: 2020-04-12 | Discharge: 2020-04-12 | Disposition: A | Discharge status: Home / Self Care | Payer: Medicare PPO | Attending: General Surgery | Admitting: General Surgery

## 2020-04-12 ENCOUNTER — Encounter (HOSPITAL_COMMUNITY): Payer: Self-pay | Admitting: General Surgery

## 2020-04-12 DIAGNOSIS — K219 Gastro-esophageal reflux disease without esophagitis: Secondary | ICD-10-CM | POA: Insufficient documentation

## 2020-04-12 DIAGNOSIS — Z8249 Family history of ischemic heart disease and other diseases of the circulatory system: Secondary | ICD-10-CM | POA: Insufficient documentation

## 2020-04-12 DIAGNOSIS — Z79899 Other long term (current) drug therapy: Secondary | ICD-10-CM | POA: Diagnosis not present

## 2020-04-12 DIAGNOSIS — N63 Unspecified lump in unspecified breast: Secondary | ICD-10-CM | POA: Diagnosis present

## 2020-04-12 DIAGNOSIS — Z7984 Long term (current) use of oral hypoglycemic drugs: Secondary | ICD-10-CM | POA: Insufficient documentation

## 2020-04-12 DIAGNOSIS — Z833 Family history of diabetes mellitus: Secondary | ICD-10-CM | POA: Insufficient documentation

## 2020-04-12 DIAGNOSIS — Z807 Family history of other malignant neoplasms of lymphoid, hematopoietic and related tissues: Secondary | ICD-10-CM | POA: Diagnosis not present

## 2020-04-12 DIAGNOSIS — E119 Type 2 diabetes mellitus without complications: Secondary | ICD-10-CM | POA: Insufficient documentation

## 2020-04-12 DIAGNOSIS — N62 Hypertrophy of breast: Secondary | ICD-10-CM | POA: Insufficient documentation

## 2020-04-12 HISTORY — PX: RADIOACTIVE SEED GUIDED EXCISIONAL BREAST BIOPSY: SHX6490

## 2020-04-12 LAB — GLUCOSE, CAPILLARY
Glucose-Capillary: 112 mg/dL — ABNORMAL HIGH (ref 70–99)
Glucose-Capillary: 140 mg/dL — ABNORMAL HIGH (ref 70–99)

## 2020-04-12 SURGERY — RADIOACTIVE SEED GUIDED BREAST BIOPSY
Anesthesia: General | Site: Breast | Laterality: Left

## 2020-04-12 MED ORDER — CHLORHEXIDINE GLUCONATE CLOTH 2 % EX PADS: 6.0000 | MEDICATED_PAD | Freq: Once | CUTANEOUS | Status: DC

## 2020-04-12 MED ORDER — MEPERIDINE HCL 25 MG/ML IJ SOLN: 6.2500 mg | INTRAMUSCULAR | Status: DC | PRN

## 2020-04-12 MED ORDER — PHENYLEPHRINE 40 MCG/ML (10ML) SYRINGE FOR IV PUSH (FOR BLOOD PRESSURE SUPPORT)
PREFILLED_SYRINGE | INTRAVENOUS | Status: AC
  Filled 2020-04-12: qty 20

## 2020-04-12 MED ORDER — OXYCODONE HCL 5 MG PO TABS: 5.0000 mg | ORAL_TABLET | Freq: Once | ORAL | Status: DC | PRN

## 2020-04-12 MED ORDER — ACETAMINOPHEN 500 MG PO TABS
ORAL_TABLET | ORAL | Status: AC
  Filled 2020-04-12: qty 2

## 2020-04-12 MED ORDER — BUPIVACAINE-EPINEPHRINE (PF) 0.25% -1:200000 IJ SOLN
INTRAMUSCULAR | Status: AC
  Filled 2020-04-12: qty 30

## 2020-04-12 MED ORDER — FENTANYL CITRATE (PF) 100 MCG/2ML IJ SOLN: 25.0000 ug | INTRAMUSCULAR | Status: DC | PRN

## 2020-04-12 MED ORDER — DEXAMETHASONE SODIUM PHOSPHATE 4 MG/ML IJ SOLN
INTRAMUSCULAR | Status: DC | PRN
  Administered 2020-04-12: 4 mg via INTRAVENOUS

## 2020-04-12 MED ORDER — PROPOFOL 10 MG/ML IV BOLUS
INTRAVENOUS | Status: AC
  Filled 2020-04-12: qty 20

## 2020-04-12 MED ORDER — CEFAZOLIN SODIUM-DEXTROSE 2-4 GM/100ML-% IV SOLN
2.0000 g | INTRAVENOUS | Status: AC
  Administered 2020-04-12: 2 g via INTRAVENOUS

## 2020-04-12 MED ORDER — MIDAZOLAM HCL 2 MG/2ML IJ SOLN
0.5000 mg | Freq: Once | INTRAMUSCULAR | Status: DC | PRN
Start: 2020-04-12 — End: 2020-04-12

## 2020-04-12 MED ORDER — OXYCODONE HCL 5 MG/5ML PO SOLN: 5.0000 mg | Freq: Once | ORAL | Status: DC | PRN

## 2020-04-12 MED ORDER — CHLORHEXIDINE GLUCONATE 0.12 % MT SOLN
OROMUCOSAL | Status: AC
  Filled 2020-04-12: qty 15

## 2020-04-12 MED ORDER — CEFAZOLIN SODIUM-DEXTROSE 2-4 GM/100ML-% IV SOLN
INTRAVENOUS | Status: AC
  Filled 2020-04-12: qty 100

## 2020-04-12 MED ORDER — EPHEDRINE SULFATE-NACL 50-0.9 MG/10ML-% IV SOSY
PREFILLED_SYRINGE | INTRAVENOUS | Status: DC | PRN
  Administered 2020-04-12 (×3): 10 mg via INTRAVENOUS

## 2020-04-12 MED ORDER — ONDANSETRON HCL 4 MG/2ML IJ SOLN
INTRAMUSCULAR | Status: DC | PRN
  Administered 2020-04-12: 4 mg via INTRAVENOUS

## 2020-04-12 MED ORDER — LIDOCAINE HCL (PF) 1 % IJ SOLN
INTRAMUSCULAR | Status: AC
  Filled 2020-04-12: qty 30

## 2020-04-12 MED ORDER — PHENYLEPHRINE 40 MCG/ML (10ML) SYRINGE FOR IV PUSH (FOR BLOOD PRESSURE SUPPORT)
PREFILLED_SYRINGE | INTRAVENOUS | Status: DC | PRN
Start: 2020-04-12 — End: 2020-04-12
  Administered 2020-04-12: 80 ug via INTRAVENOUS
  Administered 2020-04-12: 120 ug via INTRAVENOUS

## 2020-04-12 MED ORDER — OXYCODONE HCL 5 MG PO TABS: 2.5000 mg | ORAL_TABLET | Freq: Four times a day (QID) | ORAL | 0 refills | Status: DC | PRN

## 2020-04-12 MED ORDER — LIDOCAINE HCL 1 % IJ SOLN
INTRAMUSCULAR | Status: DC | PRN
  Administered 2020-04-12: 30 mL

## 2020-04-12 MED ORDER — LACTATED RINGERS IV SOLN: INTRAVENOUS | Status: DC

## 2020-04-12 MED ORDER — CHLORHEXIDINE GLUCONATE 0.12 % MT SOLN
15.0000 mL | Freq: Once | OROMUCOSAL | Status: AC
  Administered 2020-04-12: 15 mL via OROMUCOSAL

## 2020-04-12 MED ORDER — DEXAMETHASONE SODIUM PHOSPHATE 10 MG/ML IJ SOLN
INTRAMUSCULAR | Status: AC
  Filled 2020-04-12: qty 1

## 2020-04-12 MED ORDER — LIDOCAINE 2% (20 MG/ML) 5 ML SYRINGE
INTRAMUSCULAR | Status: DC | PRN
  Administered 2020-04-12: 60 mg via INTRAVENOUS

## 2020-04-12 MED ORDER — ONDANSETRON HCL 4 MG/2ML IJ SOLN
INTRAMUSCULAR | Status: AC
  Filled 2020-04-12: qty 2

## 2020-04-12 MED ORDER — ACETAMINOPHEN 500 MG PO TABS: 1000.0000 mg | ORAL_TABLET | ORAL | Status: DC

## 2020-04-12 MED ORDER — ORAL CARE MOUTH RINSE: 15.0000 mL | Freq: Once | OROMUCOSAL | Status: AC

## 2020-04-12 MED ORDER — FENTANYL CITRATE (PF) 250 MCG/5ML IJ SOLN
INTRAMUSCULAR | Status: AC
  Filled 2020-04-12: qty 5

## 2020-04-12 MED ORDER — PROPOFOL 10 MG/ML IV BOLUS
INTRAVENOUS | Status: DC | PRN
  Administered 2020-04-12: 50 mg via INTRAVENOUS
  Administered 2020-04-12: 150 mg via INTRAVENOUS

## 2020-04-12 MED ORDER — PROMETHAZINE HCL 25 MG/ML IJ SOLN: 6.2500 mg | INTRAMUSCULAR | Status: DC | PRN

## 2020-04-12 MED ORDER — FENTANYL CITRATE (PF) 100 MCG/2ML IJ SOLN
INTRAMUSCULAR | Status: DC | PRN
  Administered 2020-04-12: 25 ug via INTRAVENOUS
  Administered 2020-04-12: 50 ug via INTRAVENOUS
  Administered 2020-04-12: 25 ug via INTRAVENOUS

## 2020-04-12 MED ORDER — 0.9 % SODIUM CHLORIDE (POUR BTL) OPTIME
TOPICAL | Status: DC | PRN
  Administered 2020-04-12: 1000 mL

## 2020-04-12 MED ORDER — LIDOCAINE 2% (20 MG/ML) 5 ML SYRINGE
INTRAMUSCULAR | Status: AC
  Filled 2020-04-12: qty 5

## 2020-04-12 SURGICAL SUPPLY — 50 items
ADH SKN CLS APL DERMABOND .7 (GAUZE/BANDAGES/DRESSINGS) ×1
APL PRP STRL LF DISP 70% ISPRP (MISCELLANEOUS) ×1
BINDER BREAST LRG (GAUZE/BANDAGES/DRESSINGS) ×1 IMPLANT
BINDER BREAST XLRG (GAUZE/BANDAGES/DRESSINGS) IMPLANT
BNDG COHESIVE 4X5 TAN STRL (GAUZE/BANDAGES/DRESSINGS) ×2 IMPLANT
CANISTER SUCT 3000ML PPV (MISCELLANEOUS) ×2 IMPLANT
CHLORAPREP W/TINT 26 (MISCELLANEOUS) ×2 IMPLANT
CLIP VESOCCLUDE LG 6/CT (CLIP) ×2 IMPLANT
CLIP VESOCCLUDE MED 6/CT (CLIP) ×2 IMPLANT
CLIP VESOCCLUDE SM WIDE 6/CT (CLIP) ×2 IMPLANT
CNTNR URN SCR LID CUP LEK RST (MISCELLANEOUS) IMPLANT
CONT SPEC 4OZ STRL OR WHT (MISCELLANEOUS)
COVER PROBE W GEL 5X96 (DRAPES) ×2 IMPLANT
COVER SURGICAL LIGHT HANDLE (MISCELLANEOUS) ×2 IMPLANT
COVER WAND RF STERILE (DRAPES) ×2 IMPLANT
DERMABOND ADVANCED (GAUZE/BANDAGES/DRESSINGS) ×1
DERMABOND ADVANCED .7 DNX12 (GAUZE/BANDAGES/DRESSINGS) ×1 IMPLANT
DEVICE DUBIN SPECIMEN MAMMOGRA (MISCELLANEOUS) IMPLANT
DRAPE CHEST BREAST 15X10 FENES (DRAPES) ×2 IMPLANT
DRAPE SURG 17X23 STRL (DRAPES) IMPLANT
DRSG PAD ABDOMINAL 8X10 ST (GAUZE/BANDAGES/DRESSINGS) ×1 IMPLANT
ELECT COATED BLADE 2.86 ST (ELECTRODE) ×2 IMPLANT
ELECT NDL BLADE 2-5/6 (NEEDLE) ×1 IMPLANT
ELECT NEEDLE BLADE 2-5/6 (NEEDLE) ×2 IMPLANT
ELECT REM PT RETURN 9FT ADLT (ELECTROSURGICAL) ×2
ELECTRODE REM PT RTRN 9FT ADLT (ELECTROSURGICAL) ×1 IMPLANT
GLOVE BIO SURGEON STRL SZ 6 (GLOVE) ×2 IMPLANT
GLOVE SURG UNDER LTX SZ6.5 (GLOVE) ×2 IMPLANT
GOWN STRL REUS W/ TWL LRG LVL3 (GOWN DISPOSABLE) ×1 IMPLANT
GOWN STRL REUS W/TWL 2XL LVL3 (GOWN DISPOSABLE) ×2 IMPLANT
GOWN STRL REUS W/TWL LRG LVL3 (GOWN DISPOSABLE) ×2
KIT BASIN OR (CUSTOM PROCEDURE TRAY) ×2 IMPLANT
KIT MARKER MARGIN INK (KITS) ×2 IMPLANT
LIGHT WAVEGUIDE WIDE FLAT (MISCELLANEOUS) IMPLANT
NDL 18GX1X1/2 (RX/OR ONLY) (NEEDLE) IMPLANT
NDL FILTER BLUNT 18X1 1/2 (NEEDLE) IMPLANT
NDL HYPO 25GX1X1/2 BEV (NEEDLE) ×1 IMPLANT
NEEDLE 18GX1X1/2 (RX/OR ONLY) (NEEDLE) IMPLANT
NEEDLE FILTER BLUNT 18X 1/2SAF (NEEDLE)
NEEDLE FILTER BLUNT 18X1 1/2 (NEEDLE) IMPLANT
NEEDLE HYPO 25GX1X1/2 BEV (NEEDLE) ×2 IMPLANT
NS IRRIG 1000ML POUR BTL (IV SOLUTION) ×2 IMPLANT
PACK GENERAL/GYN (CUSTOM PROCEDURE TRAY) ×2 IMPLANT
PACK UNIVERSAL I (CUSTOM PROCEDURE TRAY) ×2 IMPLANT
STOCKINETTE IMPERVIOUS 9X36 MD (GAUZE/BANDAGES/DRESSINGS) ×2 IMPLANT
SUT MNCRL AB 4-0 PS2 18 (SUTURE) ×2 IMPLANT
SUT VIC AB 3-0 SH 8-18 (SUTURE) ×2 IMPLANT
SYR CONTROL 10ML LL (SYRINGE) ×2 IMPLANT
TOWEL GREEN STERILE (TOWEL DISPOSABLE) ×2 IMPLANT
TOWEL GREEN STERILE FF (TOWEL DISPOSABLE) ×2 IMPLANT

## 2020-04-12 NOTE — Op Note (Signed)
left Breast Radioactive seed localized excisional biopsy  Indications: This patient presents with history of abnormal left mammogram with discordant core needle biopsy.    Pre-operative Diagnosis: abnormal left mammogram    Post-operative Diagnosis: abnormal left mammogram  Surgeon: Stark Klein   Anesthesia: General endotracheal anesthesia  ASA Class: 2  Procedure Details  The patient was seen in the Holding Room. The risks, benefits, complications, treatment options, and expected outcomes were discussed with the patient. The possibilities of bleeding, infection, the need for additional procedures, failure to diagnose a condition, and creating a complication requiring transfusion or operation were discussed with the patient. The patient concurred with the proposed plan, giving informed consent.  The site of surgery properly noted/marked. The patient was taken to Operating Room # 2, identified, and the procedure verified as left Breast seed localized excisional biopsy. A Time Out was held and the above information confirmed.  The left breast and chest were prepped and draped in standard fashion. A superomedial circumareolar incision was made near the previously placed radioactive seed.  Dissection was carried down around the point of maximum signal intensity. The cautery was used to perform the dissection.   The specimen was inked with the margin marker paint kit.    Specimen radiography confirmed inclusion of the mammographic lesion, the clip, and the seed.  The background signal in the breast was zero.    Hemostasis was achieved with cautery.  The wound was irrigated and closed with 3-0 vicryl interrupted deep dermal sutures and 4-0 monocryl running subcuticular suture.      Sterile dressings were applied. At the end of the operation, all sponge, instrument, and needle counts were correct.  Findings: Seed, clip in specimen.  Anterior margin is skin  Estimated Blood Loss:  min          Specimens: left breast tissue with seed         Complications:  None; patient tolerated the procedure well.         Disposition: PACU - hemodynamically stable.         Condition: stable

## 2020-04-12 NOTE — Anesthesia Procedure Notes (Signed)
Procedure Name: LMA Insertion Date/Time: 04/12/2020 2:10 PM Performed by: Eulas Post, Derell Bruun W, CRNA Pre-anesthesia Checklist: Patient identified, Emergency Drugs available, Suction available and Patient being monitored Patient Re-evaluated:Patient Re-evaluated prior to induction Oxygen Delivery Method: Circle system utilized Preoxygenation: Pre-oxygenation with 100% oxygen Induction Type: IV induction Ventilation: Mask ventilation without difficulty LMA: LMA inserted LMA Size: 3.0 Number of attempts: 1 Placement Confirmation: positive ETCO2 and breath sounds checked- equal and bilateral Tube secured with: Tape Dental Injury: Teeth and Oropharynx as per pre-operative assessment

## 2020-04-12 NOTE — Discharge Instructions (Addendum)
[facility] °Office Phone Number [phone] ° °BREAST BIOPSY/ PARTIAL MASTECTOMY: POST OP INSTRUCTIONS ° °Always review your discharge instruction sheet given to you by the facility where your surgery was performed. ° °IF YOU HAVE DISABILITY OR FAMILY LEAVE FORMS, YOU MUST BRING THEM TO THE OFFICE FOR PROCESSING.  DO NOT GIVE THEM TO YOUR DOCTOR. ° °1. A prescription for pain medication may be given to you upon discharge.  Take your pain medication as prescribed, if needed.  If narcotic pain medicine is not needed, then you may take acetaminophen (Tylenol) or ibuprofen (Advil) as needed. °2. Take your usually prescribed medications unless otherwise directed °3. If you need a refill on your pain medication, please contact your pharmacy.  They will contact our office to request authorization.  Prescriptions will not be filled after 5pm or on week-ends. °4. You should eat very light the first 24 hours after surgery, such as soup, crackers, pudding, etc.  Resume your normal diet the day after surgery. °5. Most patients will experience some swelling and bruising in the breast.  Ice packs and a good support bra will help.  Swelling and bruising can take several days to resolve.  °6. It is common to experience some constipation if taking pain medication after surgery.  Increasing fluid intake and taking a stool softener will usually help or prevent this problem from occurring.  A mild laxative (Milk of Magnesia or Miralax) should be taken according to package directions if there are no bowel movements after 48 hours. °7. Unless discharge instructions indicate otherwise, you may remove your bandages 48 hours after surgery, and you may shower at that time.  You may have steri-strips (small skin tapes) in place directly over the incision.  These strips should be left on the skin for 7-10 days.   Any sutures or staples will be removed at the office during your follow-up visit. °8. ACTIVITIES:  You may resume  regular daily activities (gradually increasing) beginning the next day.  Wearing a good support bra or sports bra (or the breast binder) minimizes pain and swelling.  You may have sexual intercourse when it is comfortable. °a. You may drive when you no longer are taking prescription pain medication, you can comfortably wear a seatbelt, and you can safely maneuver your car and apply brakes. °b. RETURN TO WORK:  __________1 week_______________ °9. You should see your doctor in the office for a follow-up appointment approximately two weeks after your surgery.  Your doctor’s nurse will typically make your follow-up appointment when she calls you with your pathology report.  Expect your pathology report 2-3 business days after your surgery.  You may call to check if you do not hear from us after three days. ° ° °WHEN TO CALL YOUR DOCTOR: °1. Fever over 101.0 °2. Nausea and/or vomiting. °3. Extreme swelling or bruising. °4. Continued bleeding from incision. °5. Increased pain, redness, or drainage from the incision. ° °The clinic staff is available to answer your questions during regular business hours.  Please don’t hesitate to call and ask to speak to one of the nurses for clinical concerns.  If you have a medical emergency, go to the nearest emergency room or call 911.  A surgeon from [facility] is always on call at the hospital. ° °For further questions, please visit [email or web address]  ° °

## 2020-04-12 NOTE — Interval H&P Note (Signed)
History and Physical Interval Note:  04/12/2020 12:23 PM  Catherine [name]  has presented today for surgery, with the diagnosis of ABNORMAL LEFT MAMMOGRAM.  The various methods of treatment have been discussed with the patient and family. After consideration of risks, benefits and other options for treatment, the patient has consented to  Procedure(s): LEFT BREAST SEED LOCALIZED EXCISIONAL BIOPSY (Left) as a surgical intervention.  The patient's history has been reviewed, patient examined, no change in status, stable for surgery.  I have reviewed the patient's chart and labs.  Questions were answered to the patient's satisfaction.     Stark Klein

## 2020-04-12 NOTE — Anesthesia Preprocedure Evaluation (Addendum)
Anesthesia Evaluation  Patient identified by MRN, date of birth, ID band Patient awake    Reviewed: Allergy & Precautions, NPO status , Patient's Chart, lab work & pertinent test results  History of Anesthesia Complications Negative for: history of anesthetic complications  Airway Mallampati: II  TM Distance: >3 FB Neck ROM: Full    Dental  (+) Dental Advisory Given   Pulmonary asthma , COPD (has not used inhaler in quite some time),  04/08/2020 SARS coronavirus NEG   breath sounds clear to auscultation       Cardiovascular negative cardio ROS   Rhythm:Regular Rate:Normal     Neuro/Psych Anxiety negative neurological ROS     GI/Hepatic Neg liver ROS, GERD  Medicated and Controlled,  Endo/Other  diabetes (glu 112), Oral Hypoglycemic AgentsHypothyroidism   Renal/GU negative Renal ROS     Musculoskeletal  (+) Arthritis ,   Abdominal   Peds  Hematology negative hematology ROS (+)   Anesthesia Other Findings   Reproductive/Obstetrics                            Anesthesia Physical Anesthesia Plan  ASA: II  Anesthesia Plan: General   Post-op Pain Management:    Induction: Intravenous  PONV Risk Score and Plan: 3 and Ondansetron, Dexamethasone and Treatment may vary due to age or medical condition  Airway Management Planned: LMA  Additional Equipment: None  Intra-op Plan:   Post-operative Plan:   Informed Consent: I have reviewed the patients History and Physical, chart, labs and discussed the procedure including the risks, benefits and alternatives for the proposed anesthesia with the patient or authorized representative who has indicated his/her understanding and acceptance.     Dental advisory given  Plan Discussed with: CRNA and Surgeon  Anesthesia Plan Comments:        Anesthesia Quick Evaluation

## 2020-04-12 NOTE — Transfer of Care (Signed)
Immediate Anesthesia Transfer of Care Note  Patient: Catherine [name]  Procedure(s) Performed: LEFT BREAST SEED LOCALIZED EXCISIONAL BIOPSY (Left Breast)  Patient Location: PACU  Anesthesia Type:General  Level of Consciousness: drowsy  Airway & Oxygen Therapy: Patient Spontanous Breathing  Post-op Assessment: Report given to RN and Post -op Vital signs reviewed and stable  Post vital signs: Reviewed and stable  Last Vitals:  Vitals Value Taken Time  BP 137/64 04/12/20 1504  Temp    [name] 95 04/12/20 1504  Resp 20 04/12/20 1504  SpO2 100 % 04/12/20 1504  Vitals shown include unvalidated device data.  Last Pain:  Vitals:   04/12/20 1233  TempSrc:   PainSc: 0-No pain      Patients Stated Pain Goal: 4 (41/14/64 3142)  Complications: No complications documented.

## 2020-04-12 NOTE — Anesthesia Postprocedure Evaluation (Signed)
Anesthesia Post Note  Patient: Catherine [name]  Procedure(s) Performed: LEFT BREAST SEED LOCALIZED EXCISIONAL BIOPSY (Left Breast)     Patient location during evaluation: PACU Anesthesia Type: General Level of consciousness: awake and alert, patient cooperative and oriented Pain management: pain level controlled Vital Signs Assessment: post-procedure vital signs reviewed and stable Respiratory status: spontaneous breathing, nonlabored ventilation and respiratory function stable Cardiovascular status: blood pressure returned to baseline and stable Postop Assessment: no apparent nausea or vomiting and adequate PO intake Anesthetic complications: no   No complications documented.  Last Vitals:  Vitals:   04/12/20 1505 04/12/20 1519  BP: 137/64 (!) 132/58  Pulse: 95 88  Resp: 20 18  Temp: 36.5 C   SpO2: 100% 100%    Last Pain:  Vitals:   04/12/20 1505  TempSrc:   PainSc: Asleep                 Sahira Cataldi,E. Almetta Liddicoat

## 2020-04-13 ENCOUNTER — Encounter (HOSPITAL_COMMUNITY): Payer: Self-pay | Admitting: General Surgery

## 2020-04-14 LAB — SURGICAL PATHOLOGY

## 2020-04-21 ENCOUNTER — Ambulatory Visit: Payer: Medicare Other | Admitting: Obstetrics & Gynecology

## 2020-04-26 ENCOUNTER — Ambulatory Visit: Payer: Medicare Other

## 2020-04-29 ENCOUNTER — Other Ambulatory Visit: Payer: Medicare PPO

## 2020-05-03 ENCOUNTER — Other Ambulatory Visit: Payer: Self-pay

## 2020-05-03 ENCOUNTER — Encounter (HOSPITAL_BASED_OUTPATIENT_CLINIC_OR_DEPARTMENT_OTHER): Payer: Self-pay | Admitting: Obstetrics & Gynecology

## 2020-05-03 ENCOUNTER — Ambulatory Visit (INDEPENDENT_AMBULATORY_CARE_PROVIDER_SITE_OTHER): Payer: Medicare PPO | Admitting: Obstetrics & Gynecology

## 2020-05-03 VITALS — BP 119/75 | HR 71 | Ht 62.25 in | Wt 143.0 lb

## 2020-05-03 DIAGNOSIS — R102 Pelvic and perineal pain: Secondary | ICD-10-CM

## 2020-05-03 DIAGNOSIS — N763 Subacute and chronic vulvitis: Secondary | ICD-10-CM

## 2020-05-03 DIAGNOSIS — Z9889 Other specified postprocedural states: Secondary | ICD-10-CM | POA: Diagnosis not present

## 2020-05-03 DIAGNOSIS — I700 Atherosclerosis of aorta: Secondary | ICD-10-CM | POA: Insufficient documentation

## 2020-05-03 MED ORDER — CLOBETASOL PROPIONATE 0.05 % EX OINT: TOPICAL_OINTMENT | Freq: Two times a day (BID) | CUTANEOUS | 1 refills | Status: DC

## 2020-05-03 NOTE — Progress Notes (Signed)
GYNECOLOGY  VISIT  CC:   Pelvic pain x 2 episodes  HPI: 70 y.o. Catherine [name] Married [name] here for complaint of vaginal pain/abdominal pain.  Denies vaginal bleeding.  Reports she had an episode of abdominal pain with sitting about three to four weeks ago.  It happened a second time so decided to call for appt.    Denies changes with bowel function.  Is having some  She reports Dr. Collene [name] started a probiotic for her about 5-6 months back.  She has gone for follow up a few weeks ago.    Had MMG 11/2019.  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  Saw Dr. Barry [name] and underwent lumpectomy earlier this month.  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  No additional treatment needed.  Reviewed results for biopsy and lumpectomy.  Just saw Dr. Norman [name].  Does not desire breast exam today.  States has some fluid in it but is being advised to just monitor.   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  Uses rarely.  What she has is currently expired.  GYNECOLOGIC HISTORY: Patient's last menstrual period was 01/08/2009. Contraception: PMP Menopausal hormone therapy: none  Patient Active Problem List   Diagnosis Date Noted  . H/O lumpectomy 05/04/2020  . Chronic vulvitis 05/04/2020  . Hardening of the aorta (main artery of the heart) ([place]) 05/03/2020  . Iron deficiency 08/23/2016  . Iron deficiency anemia 05/23/2016  . LLQ pain 05/02/2012  . Osteopenia   . GERD (gastroesophageal reflux disease)   . Hyperplastic polyps of stomach   . Asthma without status asthmaticus   . Gastritis   . Hypothyroid   . DIABETES MELLITUS 12/11/2007  . BARRETTS ESOPHAGUS 12/11/2007  . DIVERTICULOSIS, COLON 12/11/2007  . IRRITABLE BOWEL SYNDROME 12/11/2007  . ANAL FISSURE, HX OF 12/11/2007  . HYPERLIPIDEMIA 04/11/2007  . ANXIETY STATE, UNSPECIFIED 04/11/2007    Past Medical History:   Diagnosis Date  . Anal fissure   . Anxiety   . Arthritis    bilateral knees  . Asthma   . Barrett esophagus    normal now  . Cholelithiasis   . Diabetes mellitus   . Diverticulosis    Dr Catherine [name]  . Gastritis   . GERD (gastroesophageal reflux disease)   . Hyperlipidemia   . Hyperplastic polyps of stomach   . Hypothyroidism   . IBS (irritable bowel syndrome)   . Osteopenia   . Recurrent UTI    seeing Dr Catherine [name]-recent culture negative    Past Surgical History:  Procedure Laterality Date  . CATARACT EXTRACTION Bilateral    R, then L.  Cataracts excision  . DILATION AND CURETTAGE OF UTERUS    . LAPAROSCOPY N/A 05/19/2012   Procedure: LAPAROSCOPY OPERATIVE;  Surgeon: Lyman Speller, MD;  Location: [facility];  Service: Gynecology;  Laterality: N/A;  . RADIOACTIVE SEED GUIDED EXCISIONAL BREAST BIOPSY Left 04/12/2020   Procedure: LEFT BREAST SEED LOCALIZED EXCISIONAL BIOPSY;  Surgeon: Stark Klein, MD;  Location: [facility];  Service: General;  Laterality: Left;  . SALPINGOOPHORECTOMY Bilateral 05/19/2012   Procedure: SALPINGO OOPHORECTOMY;  Surgeon: Lyman Speller, MD;  Location: [facility];  Service: Gynecology;  Laterality: Bilateral;  including left dermoid cyst  . TUBAL LIGATION     with D&C due to Incomplete AB    MEDS:   Current Outpatient Medications on File Prior to Visit  Medication Sig Dispense Refill  .  albuterol (PROAIR HFA) 108 (90 Base) MCG/ACT inhaler Inhale 1-2 puffs into the lungs every 6 (six) hours as needed. 8 g 2  . desoximetasone (TOPICORT) 0.25 % cream Apply 1 application topically daily as needed (Arm rash).    Marland Kitchen esomeprazole (NEXIUM) 40 MG capsule Take 1 capsule (40 mg total) by mouth 2 (two) times daily. (Patient taking differently: Take 40 mg by mouth daily.) 60 capsule 10  . estradiol (ESTRACE) 0.1 MG/GM vaginal cream USE PEA SIZED AMOUNT OF CREAM EXTERNALLY TWO TIMES WEEKLY (Patient taking differently: Place 1 Applicatorful vaginally See admin  instructions. Apply twice weekly per patient) 42.5 g 2  . fexofenadine (ALLEGRA) 180 MG tablet Take 180 mg by mouth at bedtime as needed.    . fluticasone (FLONASE) 50 MCG/ACT nasal spray Place 1 spray into both nostrils daily as needed for allergies.  11  . levothyroxine (SYNTHROID, LEVOTHROID) 25 MCG tablet Take 25 mcg by mouth daily. 2 tabs mon-fri then 1 tab on sat and sun    . lidocaine (XYLOCAINE) 5 % ointment Apply 1 application topically daily as needed for mild pain.    . meclizine (ANTIVERT) 25 MG tablet Take 1 tablet by mouth every 8 (eight) hours as needed for dizziness.    . metFORMIN (GLUCOPHAGE) 500 MG tablet Take 500 mg by mouth daily with breakfast.    . Polyethyl Glycol-Propyl Glycol 0.4-0.3 % SOLN Place 1 drop into both eyes at bedtime.    . simvastatin (ZOCOR) 5 MG tablet Take 5 mg by mouth daily. Takes at lunch    . Tea Tree Oil (NO FUNGUS NAIL PROTECTANT EX) Apply 1 drop topically daily as needed (Toe Fungus).    . Iron-FA-B Cmp-C-Biot-Probiotic (FUSION PLUS) CAPS Take 1 capsule by mouth daily. (Patient not taking: Reported on 05/03/2020) 30 capsule 1   No current facility-administered medications on file prior to visit.    ALLERGIES: Chocolate, Erythromycin, Fe fum-fepoly-vit c-vit b3, Pneumococcal vaccines, Prednisone, Shrimp [shellfish allergy], and Sulfa antibiotics  Family History  Problem Relation Age of Onset  . Lymphoma Brother   . Hypertension Father   . Stroke Father   . Diabetes Father 90       on po meds  . Diabetes Mother 90       on insulin  . Heart disease Brother        heart stent  . Thyroid disease Son   . Diabetes Sister        maternal aunt  . Colon cancer Neg Hx     SH:  Married, non smoker  Review of Systems  Gastrointestinal: Positive for abdominal pain.    PHYSICAL EXAMINATION:    BP 119/75   Pulse 71   Ht 5' 2.25" (1.581 m)   Wt 143 lb (64.9 kg)   LMP 01/08/2009   BMI 25.95 kg/m     General appearance: alert, cooperative  and appears stated age Abdomen: soft, non-tender; bowel sounds normal; no masses,  no organomegaly Lymph:  no inguinal LAD noted  Pelvic: External genitalia:  no lesions              Urethra:  normal appearing urethra with no masses, tenderness or lesions              Bartholins and Skenes: normal                 Vagina: normal appearing vagina with normal color and discharge, no lesions  Cervix: no lesions              Bimanual Exam:  Uterus:  normal size, contour, position, consistency, mobility, non-tender              Adnexa: no mass, fullness, tenderness  Chaperone, Acquanetta Chain, RN, was present for exam.  Assessment/Plan: 1. Pelvic pain - exam normal today.  As she only had two episodes and these have resolved, advised to just monitor.  If occurs again, will consider ultrasound.  Pt has undergone BSO in the past.  2. Chronic vulvitis - clobetasol ointment (TEMOVATE) 0.05 %; Apply topically 2 (two) times daily. Use only when has symptoms and do not use for more than 5 days.  Dispense: 30 g; Refill: 1  3. H/O lumpectomy

## 2020-05-04 DIAGNOSIS — Z9889 Other specified postprocedural states: Secondary | ICD-10-CM | POA: Insufficient documentation

## 2020-05-04 DIAGNOSIS — N763 Subacute and chronic vulvitis: Secondary | ICD-10-CM | POA: Insufficient documentation

## 2020-05-06 ENCOUNTER — Ambulatory Visit (INDEPENDENT_AMBULATORY_CARE_PROVIDER_SITE_OTHER): Payer: Medicare PPO

## 2020-05-06 ENCOUNTER — Other Ambulatory Visit: Payer: Self-pay

## 2020-05-06 DIAGNOSIS — B351 Tinea unguium: Secondary | ICD-10-CM

## 2020-05-06 DIAGNOSIS — M79674 Pain in right toe(s): Secondary | ICD-10-CM

## 2020-05-06 DIAGNOSIS — M79675 Pain in left toe(s): Secondary | ICD-10-CM

## 2020-05-06 NOTE — Progress Notes (Signed)
Patient presents today for the 5th laser treatment. Diagnosed with mycotic nail infection by Dr. Cannon Kettle.   Toenail most affected are all ten, but mildly discolored and thick. New nail growth starting to show.  All other systems are negative.  Nails were filed thin. Laser therapy was administered to 1-5 toenails bilateral and patient tolerated the treatment well. All safety precautions were in place.   She is continuing to use Formula 7 nightly.   Follow up in 6 weeks for laser # 6.

## 2020-05-17 LAB — COLOGUARD: COLOGUARD: POSITIVE — AB

## 2020-06-09 ENCOUNTER — Encounter (HOSPITAL_BASED_OUTPATIENT_CLINIC_OR_DEPARTMENT_OTHER): Payer: Self-pay | Admitting: Obstetrics & Gynecology

## 2020-06-13 ENCOUNTER — Other Ambulatory Visit: Payer: Self-pay | Admitting: Obstetrics & Gynecology

## 2020-06-20 ENCOUNTER — Encounter (HOSPITAL_BASED_OUTPATIENT_CLINIC_OR_DEPARTMENT_OTHER): Payer: Self-pay

## 2020-06-20 ENCOUNTER — Emergency Department (HOSPITAL_BASED_OUTPATIENT_CLINIC_OR_DEPARTMENT_OTHER)
Admission: EM | Admit: 2020-06-20 | Discharge: 2020-06-21 | Disposition: A | Discharge status: Home / Self Care | Payer: Medicare PPO | Attending: Emergency Medicine | Admitting: Emergency Medicine

## 2020-06-20 ENCOUNTER — Other Ambulatory Visit: Payer: Self-pay

## 2020-06-20 ENCOUNTER — Emergency Department (HOSPITAL_BASED_OUTPATIENT_CLINIC_OR_DEPARTMENT_OTHER): Payer: Medicare PPO

## 2020-06-20 DIAGNOSIS — E119 Type 2 diabetes mellitus without complications: Secondary | ICD-10-CM | POA: Diagnosis not present

## 2020-06-20 DIAGNOSIS — J45909 Unspecified asthma, uncomplicated: Secondary | ICD-10-CM | POA: Insufficient documentation

## 2020-06-20 DIAGNOSIS — K8020 Calculus of gallbladder without cholecystitis without obstruction: Secondary | ICD-10-CM | POA: Insufficient documentation

## 2020-06-20 DIAGNOSIS — Z79899 Other long term (current) drug therapy: Secondary | ICD-10-CM | POA: Diagnosis not present

## 2020-06-20 DIAGNOSIS — Z7984 Long term (current) use of oral hypoglycemic drugs: Secondary | ICD-10-CM | POA: Insufficient documentation

## 2020-06-20 DIAGNOSIS — R1010 Upper abdominal pain, unspecified: Secondary | ICD-10-CM

## 2020-06-20 DIAGNOSIS — E039 Hypothyroidism, unspecified: Secondary | ICD-10-CM | POA: Diagnosis not present

## 2020-06-20 DIAGNOSIS — K529 Noninfective gastroenteritis and colitis, unspecified: Secondary | ICD-10-CM | POA: Diagnosis not present

## 2020-06-20 LAB — CBC WITH DIFFERENTIAL/PLATELET
Abs Immature Granulocytes: 0.06 10*3/uL (ref 0.00–0.07)
Basophils Absolute: 0.1 10*3/uL (ref 0.0–0.1)
Basophils Relative: 1 %
Eosinophils Absolute: 0.2 10*3/uL (ref 0.0–0.5)
Eosinophils Relative: 1 %
HCT: 41.8 % (ref 36.0–46.0)
Hemoglobin: 13.8 g/dL (ref 12.0–15.0)
Immature Granulocytes: 1 %
Lymphocytes Relative: 36 %
Lymphs Abs: 4.3 10*3/uL — ABNORMAL HIGH (ref 0.7–4.0)
MCH: 28.1 pg (ref 26.0–34.0)
MCHC: 33 g/dL (ref 30.0–36.0)
MCV: 85.1 fL (ref 80.0–100.0)
Monocytes Absolute: 0.6 10*3/uL (ref 0.1–1.0)
Monocytes Relative: 5 %
Neutro Abs: 6.8 10*3/uL (ref 1.7–7.7)
Neutrophils Relative %: 56 %
Platelets: 261 10*3/uL (ref 150–400)
RBC: 4.91 MIL/uL (ref 3.87–5.11)
RDW: 14.5 % (ref 11.5–15.5)
WBC: 11.9 10*3/uL — ABNORMAL HIGH (ref 4.0–10.5)
nRBC: 0 % (ref 0.0–0.2)

## 2020-06-20 LAB — COMPREHENSIVE METABOLIC PANEL
ALT: 12 U/L (ref 0–44)
AST: 11 U/L — ABNORMAL LOW (ref 15–41)
Albumin: 4.2 g/dL (ref 3.5–5.0)
Alkaline Phosphatase: 97 U/L (ref 38–126)
Anion gap: 9 (ref 5–15)
BUN: 18 mg/dL (ref 8–23)
CO2: 25 mmol/L (ref 22–32)
Calcium: 9.7 mg/dL (ref 8.9–10.3)
Chloride: 102 mmol/L (ref 98–111)
Creatinine, Ser: 0.74 mg/dL (ref 0.44–1.00)
GFR, Estimated: >60 mL/min (ref >60)
Glucose, Bld: 143 mg/dL — ABNORMAL HIGH (ref 70–99)
Potassium: 4.2 mmol/L (ref 3.5–5.1)
Sodium: 136 mmol/L (ref 135–145)
Total Bilirubin: 0.4 mg/dL (ref 0.3–1.2)
Total Protein: 7.3 g/dL (ref 6.5–8.1)

## 2020-06-20 LAB — URINALYSIS, ROUTINE W REFLEX MICROSCOPIC
Bilirubin Urine: NEGATIVE
Glucose, UA: NEGATIVE mg/dL
Hgb urine dipstick: NEGATIVE
Ketones, ur: NEGATIVE mg/dL
Leukocytes,Ua: NEGATIVE
Nitrite: NEGATIVE
Protein, ur: NEGATIVE mg/dL
Specific Gravity, Urine: <1.005 — ABNORMAL LOW (ref 1.005–1.030)
pH: 6 (ref 5.0–8.0)

## 2020-06-20 LAB — LIPASE, BLOOD: Lipase: 12 U/L (ref 11–51)

## 2020-06-20 MED ORDER — MORPHINE SULFATE (PF) 4 MG/ML IV SOLN
4.0000 mg | Freq: Once | INTRAVENOUS | Status: AC
  Administered 2020-06-20: 4 mg via INTRAVENOUS
  Filled 2020-06-20: qty 1

## 2020-06-20 MED ORDER — ONDANSETRON HCL 4 MG/2ML IJ SOLN
4.0000 mg | Freq: Once | INTRAMUSCULAR | Status: AC
  Administered 2020-06-20: 4 mg via INTRAVENOUS
  Filled 2020-06-20: qty 2

## 2020-06-20 MED ORDER — IOHEXOL 300 MG/ML  SOLN
75.0000 mL | Freq: Once | INTRAMUSCULAR | Status: AC | PRN
  Administered 2020-06-20: 75 mL via INTRAVENOUS

## 2020-06-20 NOTE — ED Triage Notes (Signed)
Pt  c/o abd pain since she ate lunch at 1300 today - states she took pepto bismol  but it didn't help -also she's been burping a lot  since

## 2020-06-21 MED ORDER — ONDANSETRON 4 MG PO TBDP: 4.0000 mg | ORAL_TABLET | Freq: Three times a day (TID) | ORAL | 0 refills | Status: DC | PRN

## 2020-06-21 MED ORDER — DICYCLOMINE HCL 20 MG PO TABS: 20.0000 mg | ORAL_TABLET | Freq: Two times a day (BID) | ORAL | 0 refills | Status: DC

## 2020-06-21 NOTE — ED Provider Notes (Signed)
[facility] EMERGENCY DEPT Provider Note   CSN: [id number] Arrival date & time: 06/20/20  1956     History Chief Complaint  Patient presents with   Abdominal Pain    Catherine [name] is a 70 y.o. female.  HPI     This is a 70 year old female with a history of anal fissure, diabetes, reflux, hyperlipidemia, IBS who presents with abdominal pain.  Patient reports she ate a normal lunch mid afternoon.  After that she had onset of crampy upper abdominal pain.  She took Pepto-Bismol and Beano with no relief.  She reports nausea without vomiting.  She states she has had normal bowel movements since that time which have been slightly loose.  She states her pain was 10 out of 10 prior to arrival.  It is now 7 out of 10.  It is mostly in her mid abdomen and is nonradiating.  She describes it as crampy.  She called her primary gastroenterologist who encouraged her to be evaluated.  Of note, she reports having a colonoscopy and an endoscopy that several weeks ago which were reassuring.  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/04/2020   Hardening of the aorta (main artery of the heart) ([place]) 05/03/2020   Iron deficiency 08/23/2016   Iron deficiency anemia 05/23/2016   LLQ pain 05/02/2012   Osteopenia    GERD (gastroesophageal reflux disease)    Hyperplastic polyps of stomach    Asthma without status asthmaticus    Gastritis    Hypothyroid    DIABETES MELLITUS  12/11/2007   BARRETTS ESOPHAGUS 12/11/2007   DIVERTICULOSIS, COLON 12/11/2007   IRRITABLE BOWEL SYNDROME 12/11/2007   ANAL FISSURE, HX OF 12/11/2007   HYPERLIPIDEMIA 04/11/2007   ANXIETY STATE, UNSPECIFIED 04/11/2007    Past Surgical History:  Procedure Laterality Date   CATARACT EXTRACTION Bilateral    R, then L.  Cataracts excision   DILATION AND CURETTAGE OF UTERUS     LAPAROSCOPY N/A 05/19/2012   Procedure: LAPAROSCOPY OPERATIVE;  Surgeon: Lyman Speller, MD;  Location: [facility];  Service: Gynecology;  Laterality: N/A;   RADIOACTIVE SEED GUIDED EXCISIONAL BREAST BIOPSY Left 04/12/2020   Procedure: LEFT BREAST SEED LOCALIZED EXCISIONAL BIOPSY;  Surgeon: Stark Klein, MD;  Location: [facility];  Service: General;  Laterality: Left;   SALPINGOOPHORECTOMY Bilateral 05/19/2012   Procedure: SALPINGO OOPHORECTOMY;  Surgeon: Lyman Speller, MD;  Location: [facility];  Service: Gynecology;  Laterality: Bilateral;  including left dermoid cyst   TUBAL LIGATION     with D&C due to Incomplete AB     OB History     Gravida  4   Para  3   Term  2   Preterm  1   AB  1   Living  2      SAB  1  IAB      Ectopic  0   Multiple      Live Births              Family History  Problem Relation Age of Onset   Lymphoma Brother    Hypertension Father    Stroke Father    Diabetes Father 27       on po meds   Diabetes Mother 65       on insulin   Heart disease Brother        heart stent   Thyroid disease Son    Diabetes Sister        maternal aunt   Colon cancer Neg Hx     Social History   Tobacco Use   Smoking status: Never   Smokeless tobacco: Never  Vaping Use   Vaping Use: Never used  Substance Use Topics   Alcohol use: No   Drug use: No    Home Medications Prior to Admission medications   Medication Sig Start Date End Date Taking? Authorizing Provider  dicyclomine (BENTYL) 20 MG tablet Take 1 tablet (20 mg total) by mouth 2 (two) times daily. 06/21/20   Yes Eden Toohey, Barbette Hair, MD  ondansetron (ZOFRAN ODT) 4 MG disintegrating tablet Take 1 tablet (4 mg total) by mouth every 8 (eight) hours as needed for nausea or vomiting. 06/21/20  Yes Yameli Delamater, Barbette Hair, MD  albuterol (PROAIR HFA) 108 (90 Base) MCG/ACT inhaler Inhale 1-2 puffs into the lungs every 6 (six) hours as needed. 11/25/18   Megan Salon, MD  clobetasol ointment (TEMOVATE) 0.05 % Apply topically 2 (two) times daily. Use only when has symptoms and do not use for more than 5 days. 05/03/20   Megan Salon, MD  desoximetasone (TOPICORT) 0.25 % cream Apply 1 application topically daily as needed (Arm rash). 07/27/15   [provider]  esomeprazole (NEXIUM) 40 MG capsule Take 1 capsule (40 mg total) by mouth 2 (two) times daily. Patient taking differently: Take 40 mg by mouth daily. 09/17/12   Lafayette Dragon, MD  estradiol (ESTRACE) 0.1 MG/GM vaginal cream USE PEA SIZED AMOUNT OF CREAM EXTERNALLY TWO TIMES WEEKLY 06/13/20   Megan Salon, MD  fexofenadine (ALLEGRA) 180 MG tablet Take 180 mg by mouth at bedtime as needed.    [provider]  fluticasone (FLONASE) 50 MCG/ACT nasal spray Place 1 spray into both nostrils daily as needed for allergies. 09/19/17   [provider]  Iron-FA-B Cmp-C-Biot-Probiotic (FUSION PLUS) CAPS Take 1 capsule by mouth daily. Patient not taking: Reported on 05/03/2020 10/17/16   Truitt Merle, MD  levothyroxine (SYNTHROID, LEVOTHROID) 25 MCG tablet Take 25 mcg by mouth daily. 2 tabs mon-fri then 1 tab on sat and sun    [provider]  lidocaine (XYLOCAINE) 5 % ointment Apply 1 application topically daily as needed for mild pain. 06/03/12   [provider]  meclizine (ANTIVERT) 25 MG tablet Take 1 tablet by mouth every 8 (eight) hours as needed for dizziness.    [provider]  metFORMIN (GLUCOPHAGE) 500 MG tablet Take 500 mg by mouth daily with breakfast.    [provider]  Polyethyl Glycol-Propyl Glycol  0.4-0.3 % SOLN Place 1 drop into both eyes at bedtime.    [provider]  simvastatin (ZOCOR) 5 MG tablet Take 5 mg by mouth daily. Takes at lunch 08/19/12   [provider]  Tea Tree Oil (NO FUNGUS NAIL PROTECTANT EX)  Apply 1 drop topically daily as needed (Toe Fungus).    [provider]    Allergies    Chocolate, Erythromycin, Fe fum-fepoly-vit c-vit b3, Pneumococcal vaccines, Prednisone, Shrimp [shellfish allergy], and Sulfa antibiotics  Review of Systems   Review of Systems  Constitutional:  Negative for fever.  Respiratory:  Negative for shortness of breath.   Cardiovascular:  Negative for chest pain.  Gastrointestinal:  Positive for abdominal pain and vomiting. Negative for constipation, diarrhea and nausea.  Genitourinary:  Negative for dysuria.  All other systems reviewed and are negative.  Physical Exam Updated Vital Signs BP 127/69   Pulse 64   Temp 98.3 F (36.8 C) (Oral)   Resp 18   Ht 1.6 m (5\' 3" )   Wt 65.8 kg   LMP 01/08/2009   SpO2 99%   BMI 25.69 kg/m   Physical Exam Vitals and nursing note reviewed.  Constitutional:      Appearance: She is well-developed. She is not ill-appearing.  HENT:     Head: Normocephalic and atraumatic.  Eyes:     Pupils: Pupils are equal, round, and reactive to light.  Cardiovascular:     Rate and Rhythm: Normal rate and regular rhythm.     Heart sounds: Normal heart sounds.  Pulmonary:     Effort: Pulmonary effort is normal. No respiratory distress.     Breath sounds: No wheezing.  Abdominal:     General: Bowel sounds are normal.     Palpations: Abdomen is soft.     Tenderness: There is abdominal tenderness in the epigastric area, periumbilical area and left upper quadrant. There is no guarding or rebound. Negative signs include Murphy's sign.  Musculoskeletal:     Cervical back: Neck supple.  Skin:    General: Skin is warm and dry.  Neurological:     Mental Status: She is alert and oriented  to person, place, and time.  Psychiatric:        Mood and Affect: Mood normal.    ED Results / Procedures / Treatments   Labs (all labs ordered are listed, but only abnormal results are displayed) Labs Reviewed  COMPREHENSIVE METABOLIC PANEL - Abnormal; Notable for the following components:      Result Value   Glucose, Bld 143 (*)    AST 11 (*)    All other components within normal limits  CBC WITH DIFFERENTIAL/PLATELET - Abnormal; Notable for the following components:   WBC 11.9 (*)    Lymphs Abs 4.3 (*)    All other components within normal limits  URINALYSIS, ROUTINE W REFLEX MICROSCOPIC - Abnormal; Notable for the following components:   Color, Urine COLORLESS (*)    Specific Gravity, Urine <1.005 (*)    All other components within normal limits  LIPASE, BLOOD    EKG None  Radiology CT ABDOMEN PELVIS W CONTRAST  Result Date: 06/21/2020 CLINICAL DATA:  Abdominal pain since lunch at 1300 hours EXAM: CT ABDOMEN AND PELVIS WITH CONTRAST TECHNIQUE: Multidetector CT imaging of the abdomen and pelvis was performed using the standard protocol following bolus administration of intravenous contrast. CONTRAST:  65mL OMNIPAQUE IOHEXOL 300 MG/ML  SOLN COMPARISON:  CT 05/02/2012 FINDINGS: Lower chest: Atelectatic changes in the otherwise clear lung bases. Normal heart size. No pericardial effusion. Coronary artery calcifications. Hepatobiliary: No concerning focal liver lesion. Normal hepatic attenuation. Smooth surface contour.Gallbladder contains multiple calcified gallstones. No significant pericholecystic fluid or inflammation. No gallbladder wall thickening. No significant biliary ductal dilatation or visible intraductal gallstones.  Pancreas: No pancreatic ductal dilatation or surrounding inflammatory changes. Spleen: Normal in size. No concerning splenic lesions. Adrenals/Urinary Tract: Normal adrenals. Kidneys are normally located with symmetric enhancementand excretion. Few tiny  subcentimeter hypoattenuating foci in both kidneys too small to fully characterize on CT imaging but statistically likely benign. No suspicious renal lesion, urolithiasis or hydronephrosis. Urinary bladder is unremarkable for degree of distension. Stomach/Bowel: Distal esophagus, stomach and duodenum are unremarkable. Focally thickened segment of small bowel noted in the low midline abdomen (2/61) with some increased surrounding mesenteric vascularity. Diffusely fluid-filled appearance throughout this segment and much of the distal small bowel. No other significant mural thickening. No worrisome dilatation. A normal appendix is visualized. No colonic dilatation or wall thickening. Scattered colonic diverticula without focal inflammation to suggest diverticulitis. Vascular/Lymphatic: Atherosclerotic calcifications within the abdominal aorta and branch vessels. No aneurysm or ectasia. No enlarged abdominopelvic lymph nodes. Reproductive: Anteverted uterus.  No concerning adnexal lesions. Other: No abdominopelvic free fluid or free gas. No bowel containing hernias. Tiny fat containing umbilical hernia. Musculoskeletal: No acute osseous abnormality or suspicious osseous lesion. Multilevel degenerative changes are present in the imaged portions of the spine. Features most pronounced L5-S1. Slight dextrocurvature of the lumbar spine similar to priors, apex L3. Additional degenerative changes in the hips and pelvis. IMPRESSION: Focally thickened segment of distal small bowel in the low midline abdomen with some increased mesenteric vascularity, could reflect a nonspecific focal enteritis in the appropriate clinical setting. Colonic diverticulosis without evidence of acute diverticulitis. Cholelithiasis without evidence of acute cholecystitis, visible choledocholithiasis or biliary ductal dilatation. Aortic Atherosclerosis (ICD10-I70.0).  Coronary atherosclerosis. Electronically Signed   By: Lovena Le M.D.   On:  06/21/2020 00:00    Procedures Procedures   Medications Ordered in ED Medications  morphine 4 MG/ML injection 4 mg (4 mg Intravenous Given 06/20/20 2356)  ondansetron (ZOFRAN) injection 4 mg (4 mg Intravenous Given 06/20/20 2357)  iohexol (OMNIPAQUE) 300 MG/ML solution 75 mL (75 mLs Intravenous Contrast Given 06/20/20 2336)    ED Course  I have reviewed the triage vital signs and the nursing notes.  Pertinent labs & imaging results that were available during my care of the patient were reviewed by me and considered in my medical decision making (see chart for details).  Clinical Course as of 06/21/20 0138  Tue Jun 21, 2020  0100 Patient continues to be well-appearing on recheck.  CT is most consistent with likely an enteritis.  She has gallstones without evidence of acute cholecystitis.  Given the location of her pain, feel patient's symptoms are most likely related to colonic findings.  She was able to tolerate fluids.  Will send home with supportive treatment including Zofran and Bentyl.  I did discuss with her that if she begins to have recurrent pain after eating, she may need further evaluation for biliary colic including ultrasound imaging.  Patient stated understanding. [CH]    Clinical Course User Index [CH] Hesper Venturella, Barbette Hair, MD   MDM Rules/Calculators/A&P                          Patient presents with abdominal pain.  She is overall nontoxic and vital signs are reassuring.  Physical exam is with tenderness mostly in the epigastrium and to the left abdomen.  Considerations include but not limited to, pancreatitis, gastroenteritis, cholecystitis colitis.  She is nontoxic-appearing.  She has a slight leukocytosis without a left shift.  CMP shows no evidence of LFT derangements and  lipase is normal.  Less concerning for gallbladder pathology.  Will obtain CT imaging for further evaluation.  CT shows gallstones but no evidence of cholecystitis.  She does have some bowel wall thickening  likely representative of some enteritis.  Given location of pain, feel this is likely the cause of the patient's symptoms.  She also has a history of IBS.  See clinical course above.  We will treat supportively.  She was advised if she has recurrent pain or pain after eating, she needs further evaluation.  Patient stated understanding.  After history, exam, and medical workup I feel the patient has been appropriately medically screened and is safe for discharge home. Pertinent diagnoses were discussed with the patient. Patient was given return precautions.  Final Clinical Impression(s) / ED Diagnoses Final diagnoses:  Pain of upper abdomen  Enteritis  Calculus of gallbladder without cholecystitis without obstruction    Rx / DC Orders ED Discharge Orders          Ordered    ondansetron (ZOFRAN ODT) 4 MG disintegrating tablet  Every 8 hours PRN        06/21/20 0136    dicyclomine (BENTYL) 20 MG tablet  2 times daily        06/21/20 0136             Jaz Laningham, Barbette Hair, MD 06/21/20 0140

## 2020-06-21 NOTE — Discharge Instructions (Addendum)
You were seen today for abdominal pain.  This could be a viral enteritis.  You do have gallstones but there is no acute inflammation of your gallbladder.  If you begin to have recurrent pain after eating, you should be reevaluated with ultrasound imaging.  Take medications as prescribed.  Make sure you are staying hydrated.

## 2020-07-15 ENCOUNTER — Other Ambulatory Visit: Payer: Medicare PPO

## 2020-07-18 ENCOUNTER — Other Ambulatory Visit: Payer: Self-pay | Admitting: General Surgery

## 2020-07-18 DIAGNOSIS — K8010 Calculus of gallbladder with chronic cholecystitis without obstruction: Secondary | ICD-10-CM | POA: Insufficient documentation

## 2020-07-22 ENCOUNTER — Other Ambulatory Visit: Payer: Self-pay

## 2020-07-22 ENCOUNTER — Ambulatory Visit (INDEPENDENT_AMBULATORY_CARE_PROVIDER_SITE_OTHER): Payer: Medicare PPO

## 2020-07-22 DIAGNOSIS — B351 Tinea unguium: Secondary | ICD-10-CM

## 2020-07-22 DIAGNOSIS — M79674 Pain in right toe(s): Secondary | ICD-10-CM

## 2020-07-22 DIAGNOSIS — M79675 Pain in left toe(s): Secondary | ICD-10-CM

## 2020-07-22 NOTE — Progress Notes (Signed)
Patient presents today for the 6th laser treatment. Diagnosed with mycotic nail infection by Dr. Cannon Kettle.   Toenail most affected are all ten, but mildly discolored and thick. New nail growth starting to show.  All other systems are negative.  Nails were filed thin. Laser therapy was administered to 1-5 toenails bilateral and patient tolerated the treatment well. All safety precautions were in place.   She is continuing to use Formula 7 nightly.   Patient has completed the recommended laser treatments. He will follow up with Dr. Cannon Kettle in 3 months to evaluate progress.

## 2020-07-22 NOTE — Patient Instructions (Signed)

## 2020-08-16 NOTE — Pre-Procedure Instructions (Addendum)
Surgical Instructions   Your procedure is scheduled on Tuesday, August 16th, 2022. Report to [facility] Main Entrance "A" at 07:00 A.M., then check in with the Admitting office. Call this number if you have problems the morning of surgery: [phone]   If you have any questions prior to your surgery date call [phone]: Open Monday-Friday 8am-4pm   Remember: Do not eat after midnight the night before your surgery  You may drink clear (low sugar) liquids until 04:00 AM the morning of your surgery.   Clear liquids allowed are: Water, Non-Citrus Juices (without pulp), Carbonated Beverages, Clear Tea, Black Coffee Only, and Gatorade   Patient Instructions  The night before surgery:  No food after midnight. ONLY clear liquids after midnight  The day of surgery (if you have diabetes): Drink ONE (1) 12 oz G2 given to you in your pre admission testing appointment by 04:00 the morning of surgery. Drink in one sitting. Do not sip.  This drink was given to you during your hospital  pre-op appointment visit.  Nothing else to drink after completing the  12 oz bottle of G2.         If you have questions, please contact your surgeon's office.   Take these medicines the morning of surgery with A SIP OF WATER  esomeprazole (NEXIUM) levothyroxine (SYNTHROID) simvastatin (ZOCOR)  If Needed: albuterol (PROAIR HFA)- bring inhaler with you on day of surgery bismuth subsalicylate (PEPTO BISMOL)  dicyclomine (BENTYL) fluticasone (FLONASE)  loratadine (CLARITIN)   As of today, STOP taking any Aspirin (unless otherwise instructed by your surgeon) Aleve, Naproxen, Ibuprofen, Motrin, Advil, Goody's, BC's, all herbal medications, fish oil, and all vitamins.  WHAT DO I DO ABOUT MY DIABETES MEDICATION?   Do not take metFORMIN (GLUCOPHAGE-XR) the morning of surgery. Take usual dose the day before surgery (8/15)    [place]  Why is it important to  control my blood sugar before and after surgery? Improving blood sugar levels before and after surgery helps healing and can limit problems. A way of improving blood sugar control is eating a healthy diet by:  Eating less sugar and carbohydrates  Increasing activity/exercise  Talking with your doctor about reaching your blood sugar goals High blood sugars (greater than 180 mg/dL) can raise your risk of infections and slow your recovery, so you will need to focus on controlling your diabetes during the weeks before surgery. Make sure that the doctor who takes care of your diabetes knows about your planned surgery including the date and location.  How do I manage my blood sugar before surgery? Check your blood sugar at least 4 times a day, starting 2 days before surgery, to make sure that the level is not too high or low.  Check your blood sugar the morning of your surgery when you wake up and every 2 hours until you get to the Short Stay unit.  If your blood sugar is less than 70 mg/dL, you will need to treat for low blood sugar: Do not take insulin. Treat a low blood sugar (less than 70 mg/dL) with  cup of clear juice (cranberry or apple), 4 glucose tablets, OR glucose gel. Recheck blood sugar in 15 minutes after treatment (to make sure it is greater than 70 mg/dL). If your blood sugar is not greater than 70 mg/dL on recheck, call [phone] for further instructions. Report your blood sugar to the short stay nurse when you get to Short Stay.  If you are admitted to the hospital after surgery: Your blood sugar will be checked by the staff and you will probably be given insulin after surgery (instead of oral diabetes medicines) to make sure you have good blood sugar levels. The goal for blood sugar control after surgery is 80-180 mg/dL.            Do not wear jewelry or makeup Do not wear lotions, powders, perfumes or deodorant. Do not shave 48 hours prior to surgery.   Do not bring  valuables to the hospital. [facility] is not responsible for any belongings or valuables. DO Not wear nail polish, gel polish, artificial nails, or any other type of covering on natural nails including finger and toenails. If patients have artificial nails, gel coating, etc. that need to be removed by a nail salon please have this removed prior to surgery or surgery may need to be canceled/delayed if the surgeon/ anesthesia feels like the patient is unable to be adequately monitored.       Do NOT Smoke (Tobacco/Vaping) or drink Alcohol 24 hours prior to your procedure If you use a CPAP at night, you may bring all equipment for your overnight stay.   Contacts, glasses, dentures or bridgework may not be worn into surgery, please bring cases for these belongings   For patients admitted to the hospital, discharge time will be determined by your treatment team.   Patients discharged the day of surgery will not be allowed to drive home, and someone needs to stay with them for 24 hours.  ONLY 1 SUPPORT PERSON MAY BE PRESENT WHILE YOU ARE IN SURGERY. IF YOU ARE TO BE ADMITTED ONCE YOU ARE IN YOUR ROOM YOU WILL BE ALLOWED TWO (2) VISITORS.  Minor children may have two parents present. Special consideration for safety and communication needs will be reviewed on a case by case basis.  Special instructions:    Oral Hygiene is also important to reduce your risk of infection.  Remember - BRUSH YOUR TEETH THE MORNING OF SURGERY WITH YOUR REGULAR TOOTHPASTE   - Preparing For Surgery  Before surgery, you can play an important role. Because skin is not sterile, your skin needs to be as free of germs as possible. You can reduce the number of germs on your skin by washing with CHG (chlorahexidine gluconate) Soap before surgery.  CHG is an antiseptic cleaner which kills germs and bonds with the skin to continue killing germs even after washing.     Please do not use if you have an allergy to CHG or  antibacterial soaps. If your skin becomes reddened/irritated stop using the CHG.  Do not shave (including legs and underarms) for at least 48 hours prior to first CHG shower. It is OK to shave your face.  Please follow these instructions carefully.     Shower the NIGHT BEFORE SURGERY and the MORNING OF SURGERY with CHG Soap.   If you chose to wash your hair, wash your hair first as usual with your normal shampoo. After you shampoo, rinse your hair and body thoroughly to remove the shampoo.  Then [employer] and genitals (private parts) with your normal soap and rinse thoroughly to remove soap.  After that Use CHG Soap as you would any other liquid soap. You can apply CHG directly to the skin and wash gently with a scrungie or a clean washcloth.   Apply the CHG Soap to your body ONLY FROM THE NECK DOWN.  Do  not use on open wounds or open sores. Avoid contact with your eyes, ears, mouth and genitals (private parts). Wash Face and genitals (private parts)  with your normal soap.   Wash thoroughly, paying special attention to the area where your surgery will be performed.  Thoroughly rinse your body with warm water from the neck down.  DO NOT shower/wash with your normal soap after using and rinsing off the CHG Soap.  Pat yourself dry with a CLEAN TOWEL.  Wear CLEAN PAJAMAS to bed the night before surgery  Place CLEAN SHEETS on your bed the night before your surgery  DO NOT SLEEP WITH PETS.   Day of Surgery:  Take a shower with CHG soap. Wear Clean/Comfortable clothing the morning of surgery Do not apply any deodorants/lotions.   Remember to brush your teeth WITH YOUR REGULAR TOOTHPASTE.   Please read over the following fact sheets that you were given.

## 2020-08-17 ENCOUNTER — Encounter (HOSPITAL_COMMUNITY): Payer: Self-pay

## 2020-08-17 ENCOUNTER — Encounter (HOSPITAL_COMMUNITY)
Admission: RE | Admit: 2020-08-17 | Discharge: 2020-08-17 | Disposition: A | Discharge status: Home / Self Care | Payer: Medicare PPO | Source: Ambulatory Visit | Attending: General Surgery | Admitting: General Surgery

## 2020-08-17 ENCOUNTER — Other Ambulatory Visit: Payer: Self-pay

## 2020-08-17 DIAGNOSIS — Z01812 Encounter for preprocedural laboratory examination: Secondary | ICD-10-CM | POA: Diagnosis present

## 2020-08-17 LAB — COMPREHENSIVE METABOLIC PANEL
ALT: 16 U/L (ref 0–44)
AST: 15 U/L (ref 15–41)
Albumin: 3.8 g/dL (ref 3.5–5.0)
Alkaline Phosphatase: 80 U/L (ref 38–126)
Anion gap: 6 (ref 5–15)
BUN: 11 mg/dL (ref 8–23)
CO2: 26 mmol/L (ref 22–32)
Calcium: 9.5 mg/dL (ref 8.9–10.3)
Chloride: 106 mmol/L (ref 98–111)
Creatinine, Ser: 0.76 mg/dL (ref 0.44–1.00)
GFR, Estimated: >60 mL/min (ref >60)
Glucose, Bld: 97 mg/dL (ref 70–99)
Potassium: 3.8 mmol/L (ref 3.5–5.1)
Sodium: 138 mmol/L (ref 135–145)
Total Bilirubin: 0.5 mg/dL (ref 0.3–1.2)
Total Protein: 6.8 g/dL (ref 6.5–8.1)

## 2020-08-17 LAB — CBC WITH DIFFERENTIAL/PLATELET
Abs Immature Granulocytes: 0.04 10*3/uL (ref 0.00–0.07)
Basophils Absolute: 0.1 10*3/uL (ref 0.0–0.1)
Basophils Relative: 1 %
Eosinophils Absolute: 0.4 10*3/uL (ref 0.0–0.5)
Eosinophils Relative: 4 %
HCT: 39.2 % (ref 36.0–46.0)
Hemoglobin: 12.6 g/dL (ref 12.0–15.0)
Immature Granulocytes: 0 %
Lymphocytes Relative: 45 %
Lymphs Abs: 4 10*3/uL (ref 0.7–4.0)
MCH: 28 pg (ref 26.0–34.0)
MCHC: 32.1 g/dL (ref 30.0–36.0)
MCV: 87.1 fL (ref 80.0–100.0)
Monocytes Absolute: 0.5 10*3/uL (ref 0.1–1.0)
Monocytes Relative: 5 %
Neutro Abs: 4 10*3/uL (ref 1.7–7.7)
Neutrophils Relative %: 45 %
Platelets: 264 10*3/uL (ref 150–400)
RBC: 4.5 MIL/uL (ref 3.87–5.11)
RDW: 14.8 % (ref 11.5–15.5)
WBC: 8.9 10*3/uL (ref 4.0–10.5)
nRBC: 0 % (ref 0.0–0.2)

## 2020-08-17 LAB — HEMOGLOBIN A1C
Hgb A1c MFr Bld: 6.3 % — ABNORMAL HIGH (ref 4.8–5.6)
Mean Plasma Glucose: 134.11 mg/dL

## 2020-08-17 LAB — GLUCOSE, CAPILLARY: Glucose-Capillary: 124 mg/dL — ABNORMAL HIGH (ref 70–99)

## 2020-08-17 NOTE — Progress Notes (Signed)
PCP - Jonathon Jordan, MD Cardiologist - denies  PPM/ICD - denies  Chest x-ray - 12/24/19 EKG - 04/08/20 Stress Test - denies ECHO - denies Cardiac Cath - denies  Sleep Study - denies  Fasting Blood Sugar -  Checks Blood Sugar _____ times a day   ERAS Protcol - Yes PRE-SURGERY G2  COVID TEST- not applicable, ambulatory surgery   Anesthesia review: No  Patient denies shortness of breath, fever, cough and chest pain at PAT appointment   All instructions explained to the patient, with a verbal understanding of the material. Patient agrees to go over the instructions while at home for a better understanding. The opportunity to ask questions was provided.

## 2020-08-22 NOTE — H&P (Signed)
REFERRING PHYSICIAN:  Florina Ou,*   PROVIDER:  Georgianne Fick, MD   MRN: [id number] DOB: 07-05-1950    Subjective    Chief Complaint: New Consultation (Gallbladder sx )       History of Present Illness: Catherine [name] is a 70 y.o. female who is seen today as an office consultation at the request of Dr. Luan Pulling for evaluation of New Consultation (Gallbladder sx ) .   Patient is a 70 year old female that I know from a previous left-sided excisional breast biopsy this year.  Thankfully this was benign.  She returns with a new problem.  She has always had a history of heartburn and a "difficult stomach."  However around a month ago she had an episode of severe pain.  She called Dr. Collene Mares, her gastroenterologist, and asked for advice.  She was instructed to go to the emergency department.  Because of her abnormal LFTs, a CT scan was performed in order to maximize the effect of the study.  She was found to have a small area of thickened distal small bowel and gallstones.  Given that her pain resolved and she had no LFT abnormalities, she went home.  She has not had recurrence of the severe pain.  She continues on her proton pump inhibitor.  Of note, her mother also had gallstones at a similar age.   Review of Systems: A complete review of systems was obtained from the patient.  I have reviewed this information and discussed as appropriate with the patient.  See HPI as well for other ROS.   Review of Systems  Gastrointestinal: Positive for heartburn.  Skin: Positive for itching and rash.  All other systems reviewed and are negative.       Medical History: Past Medical History      Past Medical History:  Diagnosis Date   Arthritis     Asthma, unspecified asthma severity, unspecified whether complicated, unspecified whether persistent     Diabetes mellitus without complication (CMS-HCC)     GERD (gastroesophageal reflux disease)     Thyroid disease             Patient  Active Problem List  Diagnosis   Chronic cholecystitis with calculus      Past Surgical History  No past surgical history on file.      Allergies      Allergies  Allergen Reactions   Erythromycin Unknown              Current Outpatient Medications on File Prior to Visit  Medication Sig Dispense Refill   clobetasoL (TEMOVATE) 0.05 % ointment clobetasol 0.05 % topical ointment       desoximetasone (TOPICORT) 0.25 % cream APPLY TO AFFECTED AREA EXTERNALY AS NEEDED 30 DAYS       levothyroxine (SYNTHROID) 25 MCG tablet levothyroxine 25 mcg tablet  TAKE TWO TABS MONDAY-FRIDAY AND ONE TAB SATURDAY AND SUNDAY 90       metFORMIN (GLUCOPHAGE) 500 MG tablet Take 500 mg by mouth daily with breakfast       simvastatin (ZOCOR) 10 MG tablet simvastatin 10 mg tablet        No current facility-administered medications on file prior to visit.      Family History       Family History  Problem Relation Age of Onset   Diabetes Sister     Diabetes Brother          Social History       Tobacco Use  Smoking Status Never Smoker  Smokeless Tobacco Never Used      Social History  Social History        Socioeconomic History   Marital status: Unknown  Tobacco Use   Smoking status: Never Smoker   Smokeless tobacco: Never Used  Scientific laboratory technician Use: Never used  Substance and Sexual Activity   Alcohol use: Never   Sexual activity: Defer        Objective:         Vitals:    07/18/20 1401  BP: 110/68  Pulse: 77  Temp: 36.6 C (97.9 F)  SpO2: 100%  Weight: 65.6 kg (144 lb 9.6 oz)    There is no height or weight on file to calculate BMI.   Physical Exam    General appearance - alert, well appearing, and in no distress Eyes - pupils equal and reactive, extraocular eye movements intact Chest - breathing comfortably Abdomen - soft, non distended.  mild RUQ tenderness.  no hepatosplenomegatly Neurological - alert, oriented, normal speech, no focal findings or movement  disorder noted Musculoskeletal - no joint tenderness, deformity or swelling Extremities - peripheral pulses normal, no pedal edema, no clubbing or cyanosis     Labs, Imaging and Diagnostic Testing:   IMPRESSION: Focally thickened segment of distal small bowel in the low midline abdomen with some increased mesenteric vascularity, could reflect a nonspecific focal enteritis in the appropriate clinical setting.   Colonic diverticulosis without evidence of acute diverticulitis.   Cholelithiasis without evidence of acute cholecystitis, visible choledocholithiasis or biliary ductal dilatation.   Aortic Atherosclerosis (ICD10-I70.0).  Coronary atherosclerosis.   Assessment and Plan:  Diagnoses and all orders for this visit:   Chronic cholecystitis with calculus Assessment & Plan: Pt does appear to have chronic cholecystitis based on presence of stones and mild tenderness.  I think she would benefit from laparoscopic cholecystectomy.     The surgical procedure was described to the patient in detail.  The patient was given educational material.  I discussed the incision type and location, the location of the gallbladder, the anatomy of the bile ducts and arteries, and the typical progression of surgery.  I discussed the possibility of converting to an open operation.  I advised of the risks of bleeding, infection, damage to other structures (such as the bile duct, intestine or liver), bile leak, need for other procedures or surgeries, and post op diarrhea/constipation.  We discussed the risk of blood clot.  We discussed the recovery period and post operative restrictions.  The patient was advised against taking blood thinners the week before surgery.     She would like to have this done mid August because of family obligations.             No follow-ups on file.   Georgianne Fick, MD

## 2020-08-23 ENCOUNTER — Ambulatory Visit (HOSPITAL_COMMUNITY): Payer: Medicare PPO | Admitting: Certified Registered Nurse Anesthetist

## 2020-08-23 ENCOUNTER — Encounter (HOSPITAL_COMMUNITY): Payer: Self-pay | Admitting: General Surgery

## 2020-08-23 ENCOUNTER — Encounter (HOSPITAL_COMMUNITY)
Admission: RE | Disposition: A | Discharge status: Home / Self Care | Payer: Self-pay | Source: Home / Self Care | Attending: General Surgery

## 2020-08-23 ENCOUNTER — Other Ambulatory Visit: Payer: Self-pay

## 2020-08-23 ENCOUNTER — Ambulatory Visit (HOSPITAL_COMMUNITY)
Admission: RE | Admit: 2020-08-23 | Discharge: 2020-08-23 | Disposition: A | Discharge status: Home / Self Care | Payer: Medicare PPO | Attending: General Surgery | Admitting: General Surgery

## 2020-08-23 DIAGNOSIS — K8010 Calculus of gallbladder with chronic cholecystitis without obstruction: Secondary | ICD-10-CM | POA: Diagnosis present

## 2020-08-23 DIAGNOSIS — Z79890 Hormone replacement therapy (postmenopausal): Secondary | ICD-10-CM | POA: Diagnosis not present

## 2020-08-23 DIAGNOSIS — Z881 Allergy status to other antibiotic agents status: Secondary | ICD-10-CM | POA: Insufficient documentation

## 2020-08-23 DIAGNOSIS — Z7984 Long term (current) use of oral hypoglycemic drugs: Secondary | ICD-10-CM | POA: Diagnosis not present

## 2020-08-23 DIAGNOSIS — I2510 Atherosclerotic heart disease of native coronary artery without angina pectoris: Secondary | ICD-10-CM | POA: Insufficient documentation

## 2020-08-23 DIAGNOSIS — Z79899 Other long term (current) drug therapy: Secondary | ICD-10-CM | POA: Insufficient documentation

## 2020-08-23 HISTORY — PX: CHOLECYSTECTOMY: SHX55

## 2020-08-23 LAB — GLUCOSE, CAPILLARY
Glucose-Capillary: 95 mg/dL (ref 70–99)
Glucose-Capillary: 145 mg/dL — ABNORMAL HIGH (ref 70–99)

## 2020-08-23 SURGERY — LAPAROSCOPIC CHOLECYSTECTOMY
Anesthesia: General | Site: Abdomen

## 2020-08-23 MED ORDER — ENSURE PRE-SURGERY PO LIQD: 296.0000 mL | Freq: Once | ORAL | Status: DC

## 2020-08-23 MED ORDER — LACTATED RINGERS IV SOLN: INTRAVENOUS | Status: DC

## 2020-08-23 MED ORDER — BUPIVACAINE-EPINEPHRINE (PF) 0.25% -1:200000 IJ SOLN
INTRAMUSCULAR | Status: AC
  Filled 2020-08-23: qty 30

## 2020-08-23 MED ORDER — ROCURONIUM BROMIDE 10 MG/ML (PF) SYRINGE
PREFILLED_SYRINGE | INTRAVENOUS | Status: DC | PRN
  Administered 2020-08-23: 40 mg via INTRAVENOUS

## 2020-08-23 MED ORDER — CHLORHEXIDINE GLUCONATE CLOTH 2 % EX PADS: 6.0000 | MEDICATED_PAD | Freq: Once | CUTANEOUS | Status: DC

## 2020-08-23 MED ORDER — OXYCODONE HCL 5 MG PO TABS
5.0000 mg | ORAL_TABLET | Freq: Once | ORAL | Status: AC | PRN
  Administered 2020-08-23: 5 mg via ORAL

## 2020-08-23 MED ORDER — 0.9 % SODIUM CHLORIDE (POUR BTL) OPTIME
TOPICAL | Status: DC | PRN
  Administered 2020-08-23: 1000 mL

## 2020-08-23 MED ORDER — FENTANYL CITRATE (PF) 100 MCG/2ML IJ SOLN
25.0000 ug | INTRAMUSCULAR | Status: DC | PRN
  Administered 2020-08-23: 50 ug via INTRAVENOUS
  Administered 2020-08-23: 25 ug via INTRAVENOUS

## 2020-08-23 MED ORDER — LIDOCAINE 2% (20 MG/ML) 5 ML SYRINGE
INTRAMUSCULAR | Status: DC | PRN
  Administered 2020-08-23: 60 mg via INTRAVENOUS

## 2020-08-23 MED ORDER — OXYCODONE HCL 5 MG PO TABS: 5.0000 mg | ORAL_TABLET | Freq: Four times a day (QID) | ORAL | 0 refills | Status: DC | PRN

## 2020-08-23 MED ORDER — OXYCODONE HCL 5 MG PO TABS
ORAL_TABLET | ORAL | Status: AC
  Filled 2020-08-23: qty 1

## 2020-08-23 MED ORDER — FENTANYL CITRATE (PF) 250 MCG/5ML IJ SOLN
INTRAMUSCULAR | Status: AC
  Filled 2020-08-23: qty 5

## 2020-08-23 MED ORDER — PHENYLEPHRINE 40 MCG/ML (10ML) SYRINGE FOR IV PUSH (FOR BLOOD PRESSURE SUPPORT)
PREFILLED_SYRINGE | INTRAVENOUS | Status: DC | PRN
Start: 2020-08-23 — End: 2020-08-23
  Administered 2020-08-23: 40 ug via INTRAVENOUS
  Administered 2020-08-23 (×2): 80 ug via INTRAVENOUS

## 2020-08-23 MED ORDER — ONDANSETRON HCL 4 MG/2ML IJ SOLN
INTRAMUSCULAR | Status: DC | PRN
  Administered 2020-08-23: 4 mg via INTRAVENOUS

## 2020-08-23 MED ORDER — CHLORHEXIDINE GLUCONATE 0.12 % MT SOLN
15.0000 mL | Freq: Once | OROMUCOSAL | Status: AC
  Administered 2020-08-23: 15 mL via OROMUCOSAL
  Filled 2020-08-23: qty 15

## 2020-08-23 MED ORDER — PROPOFOL 10 MG/ML IV BOLUS
INTRAVENOUS | Status: DC | PRN
  Administered 2020-08-23: 110 mg via INTRAVENOUS
  Administered 2020-08-23: 40 mg via INTRAVENOUS

## 2020-08-23 MED ORDER — ONDANSETRON HCL 4 MG/2ML IJ SOLN: 4.0000 mg | Freq: Once | INTRAMUSCULAR | Status: DC | PRN

## 2020-08-23 MED ORDER — PHENYLEPHRINE 40 MCG/ML (10ML) SYRINGE FOR IV PUSH (FOR BLOOD PRESSURE SUPPORT)
PREFILLED_SYRINGE | INTRAVENOUS | Status: AC
  Filled 2020-08-23: qty 10

## 2020-08-23 MED ORDER — MEPERIDINE HCL 25 MG/ML IJ SOLN: 6.2500 mg | INTRAMUSCULAR | Status: DC | PRN

## 2020-08-23 MED ORDER — ACETAMINOPHEN 500 MG PO TABS
1000.0000 mg | ORAL_TABLET | ORAL | Status: AC
  Administered 2020-08-23: 1000 mg via ORAL
  Filled 2020-08-23: qty 2

## 2020-08-23 MED ORDER — DEXAMETHASONE SODIUM PHOSPHATE 10 MG/ML IJ SOLN
INTRAMUSCULAR | Status: AC
  Filled 2020-08-23: qty 1

## 2020-08-23 MED ORDER — LIDOCAINE HCL 1 % IJ SOLN
INTRAMUSCULAR | Status: AC
  Filled 2020-08-23: qty 20

## 2020-08-23 MED ORDER — LACTATED RINGERS IV SOLN: INTRAVENOUS | Status: DC | PRN

## 2020-08-23 MED ORDER — EPHEDRINE SULFATE-NACL 50-0.9 MG/10ML-% IV SOSY
PREFILLED_SYRINGE | INTRAVENOUS | Status: DC | PRN
  Administered 2020-08-23: 5 mg via INTRAVENOUS
  Administered 2020-08-23: 10 mg via INTRAVENOUS
  Administered 2020-08-23 (×2): 5 mg via INTRAVENOUS

## 2020-08-23 MED ORDER — OXYCODONE HCL 5 MG/5ML PO SOLN: 5.0000 mg | Freq: Once | ORAL | Status: AC | PRN

## 2020-08-23 MED ORDER — EPHEDRINE 5 MG/ML INJ
INTRAVENOUS | Status: AC
  Filled 2020-08-23: qty 5

## 2020-08-23 MED ORDER — FENTANYL CITRATE (PF) 100 MCG/2ML IJ SOLN
INTRAMUSCULAR | Status: AC
  Filled 2020-08-23: qty 2

## 2020-08-23 MED ORDER — ACETAMINOPHEN 160 MG/5ML PO SOLN: 325.0000 mg | ORAL | Status: DC | PRN

## 2020-08-23 MED ORDER — LIDOCAINE 2% (20 MG/ML) 5 ML SYRINGE
INTRAMUSCULAR | Status: AC
  Filled 2020-08-23: qty 5

## 2020-08-23 MED ORDER — DEXAMETHASONE SODIUM PHOSPHATE 10 MG/ML IJ SOLN
INTRAMUSCULAR | Status: DC | PRN
  Administered 2020-08-23: 10 mg via INTRAVENOUS

## 2020-08-23 MED ORDER — FENTANYL CITRATE (PF) 250 MCG/5ML IJ SOLN
INTRAMUSCULAR | Status: DC | PRN
  Administered 2020-08-23: 50 ug via INTRAVENOUS

## 2020-08-23 MED ORDER — ORAL CARE MOUTH RINSE: 15.0000 mL | Freq: Once | OROMUCOSAL | Status: AC

## 2020-08-23 MED ORDER — LIDOCAINE HCL 1 % IJ SOLN
INTRAMUSCULAR | Status: DC | PRN
  Administered 2020-08-23: 10 mL via INTRAMUSCULAR

## 2020-08-23 MED ORDER — SUGAMMADEX SODIUM 200 MG/2ML IV SOLN
INTRAVENOUS | Status: DC | PRN
  Administered 2020-08-23: 150 mg via INTRAVENOUS

## 2020-08-23 MED ORDER — CEFAZOLIN SODIUM-DEXTROSE 2-4 GM/100ML-% IV SOLN
2.0000 g | INTRAVENOUS | Status: AC
  Administered 2020-08-23: 2 g via INTRAVENOUS
  Filled 2020-08-23: qty 100

## 2020-08-23 MED ORDER — PROPOFOL 10 MG/ML IV BOLUS
INTRAVENOUS | Status: AC
  Filled 2020-08-23: qty 20

## 2020-08-23 MED ORDER — ACETAMINOPHEN 325 MG PO TABS: 325.0000 mg | ORAL_TABLET | ORAL | Status: DC | PRN

## 2020-08-23 MED ORDER — SODIUM CHLORIDE 0.9 % IR SOLN
Status: DC | PRN
  Administered 2020-08-23: 1000 mL

## 2020-08-23 MED ORDER — ONDANSETRON HCL 4 MG/2ML IJ SOLN
INTRAMUSCULAR | Status: AC
  Filled 2020-08-23: qty 2

## 2020-08-23 SURGICAL SUPPLY — 47 items
ADH SKN CLS APL DERMABOND .7 (GAUZE/BANDAGES/DRESSINGS) ×1
APL PRP STRL LF DISP 70% ISPRP (MISCELLANEOUS) ×1
APPLIER CLIP ROT 10 11.4 M/L (STAPLE) ×2
APR CLP MED LRG 11.4X10 (STAPLE) ×1
BAG COUNTER SPONGE SURGICOUNT (BAG) ×2 IMPLANT
BAG SPEC RTRVL 10 TROC 200 (ENDOMECHANICALS) ×1
BAG SPNG CNTER NS LX DISP (BAG) ×1
BLADE CLIPPER SURG (BLADE) IMPLANT
CANISTER SUCT 3000ML PPV (MISCELLANEOUS) ×2 IMPLANT
CHLORAPREP W/TINT 26 (MISCELLANEOUS) ×2 IMPLANT
CLIP APPLIE ROT 10 11.4 M/L (STAPLE) ×1 IMPLANT
COVER SURGICAL LIGHT HANDLE (MISCELLANEOUS) ×2 IMPLANT
DECANTER SPIKE VIAL GLASS SM (MISCELLANEOUS) ×2 IMPLANT
DERMABOND ADVANCED (GAUZE/BANDAGES/DRESSINGS) ×1
DERMABOND ADVANCED .7 DNX12 (GAUZE/BANDAGES/DRESSINGS) ×1 IMPLANT
DRAPE WARM FLUID 44X44 (DRAPES) ×2 IMPLANT
ELECT REM PT RETURN 9FT ADLT (ELECTROSURGICAL) ×2
ELECTRODE REM PT RTRN 9FT ADLT (ELECTROSURGICAL) ×1 IMPLANT
FILTER SMOKE EVAC LAPAROSHD (FILTER) IMPLANT
GLOVE SURG ENC MOIS LTX SZ6 (GLOVE) ×2 IMPLANT
GLOVE SURG UNDER LTX SZ6.5 (GLOVE) ×2 IMPLANT
GOWN STRL REUS W/ TWL LRG LVL3 (GOWN DISPOSABLE) ×2 IMPLANT
GOWN STRL REUS W/TWL 2XL LVL3 (GOWN DISPOSABLE) ×2 IMPLANT
GOWN STRL REUS W/TWL LRG LVL3 (GOWN DISPOSABLE) ×4
KIT BASIN OR (CUSTOM PROCEDURE TRAY) ×2 IMPLANT
KIT TURNOVER KIT B (KITS) ×2 IMPLANT
L-HOOK LAP DISP 36CM (ELECTROSURGICAL) ×2
LHOOK LAP DISP 36CM (ELECTROSURGICAL) ×1 IMPLANT
NS IRRIG 1000ML POUR BTL (IV SOLUTION) ×2 IMPLANT
PAD ARMBOARD 7.5X6 YLW CONV (MISCELLANEOUS) ×2 IMPLANT
PENCIL BUTTON HOLSTER BLD 10FT (ELECTRODE) ×2 IMPLANT
POUCH RETRIEVAL ECOSAC 10 (ENDOMECHANICALS) ×1 IMPLANT
POUCH RETRIEVAL ECOSAC 10MM (ENDOMECHANICALS) ×2
SCISSORS LAP 5X35 DISP (ENDOMECHANICALS) ×2 IMPLANT
SET IRRIG TUBING LAPAROSCOPIC (IRRIGATION / IRRIGATOR) ×2 IMPLANT
SET TUBE SMOKE EVAC HIGH FLOW (TUBING) ×2 IMPLANT
SLEEVE ENDOPATH XCEL 5M (ENDOMECHANICALS) ×2 IMPLANT
SPECIMEN JAR SMALL (MISCELLANEOUS) ×2 IMPLANT
SUT MNCRL AB 4-0 PS2 18 (SUTURE) ×2 IMPLANT
TOWEL GREEN STERILE (TOWEL DISPOSABLE) ×2 IMPLANT
TOWEL GREEN STERILE FF (TOWEL DISPOSABLE) ×2 IMPLANT
TRAY LAPAROSCOPIC MC (CUSTOM PROCEDURE TRAY) ×2 IMPLANT
TROCAR XCEL BLUNT TIP 100MML (ENDOMECHANICALS) ×2 IMPLANT
TROCAR XCEL NON-BLD 11X100MML (ENDOMECHANICALS) ×2 IMPLANT
TROCAR XCEL NON-BLD 5MMX100MML (ENDOMECHANICALS) ×2 IMPLANT
WARMER LAPAROSCOPE (MISCELLANEOUS) ×2 IMPLANT
WATER STERILE IRR 1000ML POUR (IV SOLUTION) ×2 IMPLANT

## 2020-08-23 NOTE — Discharge Instructions (Addendum)
[facility] Office Phone Number [phone]   POST OP INSTRUCTIONS  Always review your discharge instruction sheet given to you by the facility where your surgery was performed.  IF YOU HAVE DISABILITY OR FAMILY LEAVE FORMS, YOU MUST BRING THEM TO THE OFFICE FOR PROCESSING.  DO NOT GIVE THEM TO YOUR DOCTOR.  A prescription for pain medication may be given to you upon discharge.  Take your pain medication as prescribed, if needed.  If narcotic pain medicine is not needed, then you may take acetaminophen (Tylenol) or ibuprofen (Advil) as needed. Take your usually prescribed medications unless otherwise directed If you need a refill on your pain medication, please contact your pharmacy.  They will contact our office to request authorization.  Prescriptions will not be filled after 5pm or on week-ends. You should eat very light the first 24 hours after surgery, such as soup, crackers, pudding, etc.  Resume your normal diet the day after surgery It is common to experience some constipation if taking pain medication after surgery.  Increasing fluid intake and taking a stool softener will usually help or prevent this problem from occurring.  A mild laxative (Milk of Magnesia or Miralax) should be taken according to package directions if there are no bowel movements after 48 hours. You may shower in 48 hours.  The surgical glue will flake off in 2-3 weeks.   ACTIVITIES:  No strenuous activity or heavy lifting for 1 week.   You may drive when you no longer are taking prescription pain medication, you can comfortably wear a seatbelt, and you can safely maneuver your car and apply brakes. RETURN TO WORK:  __________n/a_______________ You should see your doctor in the office for a follow-up appointment approximately three-four weeks after your surgery.    WHEN TO CALL YOUR DOCTOR: Fever over 101.0 Nausea and/or vomiting. Extreme swelling or bruising. Continued bleeding from  incision. Increased pain, redness, or drainage from the incision.  The clinic staff is available to answer your questions during regular business hours.  Please don't hesitate to call and ask to speak to one of the nurses for clinical concerns.  If you have a medical emergency, go to the nearest emergency room or call 911.  A surgeon from [facility] is always on call at the hospital.  For further questions, please visit [email or web address]   

## 2020-08-23 NOTE — Anesthesia Preprocedure Evaluation (Signed)
Anesthesia Evaluation  Patient identified by MRN, date of birth, ID band Patient awake    Reviewed: Allergy & Precautions, NPO status , Patient's Chart, lab work & pertinent test results  History of Anesthesia Complications Negative for: history of anesthetic complications  Airway Mallampati: II  TM Distance: >3 FB Neck ROM: Full    Dental  (+) Dental Advisory Given   Pulmonary asthma , COPD (has not used inhaler in quite some time),  04/08/2020 SARS coronavirus NEG   breath sounds clear to auscultation       Cardiovascular negative cardio ROS   Rhythm:Regular Rate:Normal     Neuro/Psych Anxiety negative neurological ROS     GI/Hepatic Neg liver ROS, GERD  Medicated and Controlled,  Endo/Other  diabetes, Oral Hypoglycemic AgentsHypothyroidism   Renal/GU negative Renal ROS     Musculoskeletal  (+) Arthritis ,   Abdominal   Peds  Hematology negative hematology ROS (+) Blood dyscrasia, anemia ,   Anesthesia Other Findings   Reproductive/Obstetrics                             Anesthesia Physical  Anesthesia Plan  ASA: 2  Anesthesia Plan: General   Post-op Pain Management:    Induction: Intravenous  PONV Risk Score and Plan: 3 and Ondansetron, Dexamethasone and Treatment may vary due to age or medical condition  Airway Management Planned: Oral ETT  Additional Equipment: None  Intra-op Plan:   Post-operative Plan:   Informed Consent: I have reviewed the patients History and Physical, chart, labs and discussed the procedure including the risks, benefits and alternatives for the proposed anesthesia with the patient or authorized representative who has indicated his/her understanding and acceptance.     Dental advisory given  Plan Discussed with: CRNA and Anesthesiologist  Anesthesia Plan Comments:         Anesthesia Quick Evaluation

## 2020-08-23 NOTE — Transfer of Care (Signed)
Immediate Anesthesia Transfer of Care Note  Patient: Catherine [name]  Procedure(s) Performed: LAPAROSCOPIC CHOLECYSTECTOMY (Abdomen)  Patient Location: PACU  Anesthesia Type:General  Level of Consciousness: awake and alert   Airway & Oxygen Therapy: Patient Spontanous Breathing  Post-op Assessment: Report given to RN, Post -op Vital signs reviewed and stable and Patient moving all extremities X 4  Post vital signs: Reviewed and stable  Last Vitals:  Vitals Value Taken Time  BP    Temp    Pulse 87 08/23/20 1048  Resp 21 08/23/20 1048  SpO2 98 % 08/23/20 1048  Vitals shown include unvalidated device data.  Last Pain:  Vitals:   08/23/20 0727  TempSrc:   PainSc: 0-No pain      Patients Stated Pain Goal: 2 ([id number])  Complications: No notable events documented.

## 2020-08-23 NOTE — Anesthesia Postprocedure Evaluation (Signed)
Anesthesia Post Note  Patient: Catherine [name]  Procedure(s) Performed: LAPAROSCOPIC CHOLECYSTECTOMY (Abdomen)     Patient location during evaluation: PACU Anesthesia Type: General Level of consciousness: awake and alert Pain management: pain level controlled Vital Signs Assessment: post-procedure vital signs reviewed and stable Respiratory status: spontaneous breathing, nonlabored ventilation and respiratory function stable Cardiovascular status: blood pressure returned to baseline and stable Postop Assessment: no apparent nausea or vomiting Anesthetic complications: no   No notable events documented.  Last Vitals:  Vitals:   08/23/20 1135 08/23/20 1147  BP: 128/68 124/67  Pulse: 71 74  Resp: 17 19  Temp:  (!) 36.3 C  SpO2: 93% 95%    Last Pain:  Vitals:   08/23/20 1147  TempSrc:   PainSc: [place] [name]

## 2020-08-23 NOTE — Interval H&P Note (Signed)
History and Physical Interval Note:  08/23/2020 9:05 AM  Catherine [name] A [name]  has presented today for surgery, with the diagnosis of CHRONIC CHOLECYSTITIS WITH STONES.  The various methods of treatment have been discussed with the patient and family. After consideration of risks, benefits and other options for treatment, the patient has consented to  Procedure(s): LAPAROSCOPIC CHOLECYSTECTOMY, POSSIBLE CHOLANGIOGRAM (N/A) as a surgical intervention.  The patient's history has been reviewed, patient examined, no change in status, stable for surgery.  I have reviewed the patient's chart and labs.  Questions were answered to the patient's satisfaction.     Stark Klein

## 2020-08-23 NOTE — Anesthesia Procedure Notes (Signed)
Procedure Name: Intubation Date/Time: 08/23/2020 9:31 AM Performed by: Harden Mo, CRNA Pre-anesthesia Checklist: Patient identified, Emergency Drugs available, Suction available and Patient being monitored Patient Re-evaluated:Patient Re-evaluated prior to induction Oxygen Delivery Method: Circle System Utilized Preoxygenation: Pre-oxygenation with 100% oxygen Induction Type: IV induction Ventilation: Mask ventilation without difficulty and Oral airway inserted - appropriate to patient size Laryngoscope Size: Sabra Heck and 3 Grade View: Grade I Tube type: Oral Tube size: 7.5 mm Number of attempts: 1 Airway Equipment and Method: Stylet and Oral airway Placement Confirmation: ETT inserted through vocal cords under direct vision, positive ETCO2 and breath sounds checked- equal and bilateral Secured at: 21 cm Tube secured with: Tape Dental Injury: Teeth and Oropharynx as per pre-operative assessment

## 2020-08-23 NOTE — Op Note (Addendum)
Laparoscopic Cholecystectomy  Indications: This patient presents with cholelithiasis and chronic cholecystitis and will undergo laparoscopic cholecystectomy.  Pre-operative Diagnosis: chronic cholecystitis with cholelithiasis   Post-operative Diagnosis: Same  Surgeon: Stark Klein   Assistants: Kasson, AUSTIN  Anesthesia: General endotracheal anesthesia and local  ASA Class: 2  Procedure Details  The patient was seen again in the Holding Room. The risks, benefits, complications, treatment options, and expected outcomes were discussed with the patient. The possibilities of  bleeding, recurrent infection, damage to nearby structures, the need for additional procedures, failure to diagnose a condition, the possible need to convert to an open procedure, and creating a complication requiring transfusion or operation were discussed with the patient. The likelihood of improving the patient's symptoms with return to their baseline status is good.    The patient and/or family concurred with the proposed plan, giving informed consent. The site of surgery properly noted. The patient was taken to Operating Room, and the procedure verified as Laparoscopic Cholecystectomy. A Time Out was held and the above information confirmed.  Prior to the induction of general anesthesia, antibiotic prophylaxis was administered. General endotracheal anesthesia was then administered and tolerated well. After the induction, the abdomen was prepped with Chloraprep and draped in the sterile fashion. The patient was positioned in the supine position.  Local anesthetic agent was injected into the skin near the umbilicus and a 2cm vertical incision was made with a #15 blade. I dissected down to the abdominal fascia with blunt dissection.  The fascia was incised vertically and we entered the peritoneal cavity bluntly.  A pursestring suture of 0-Vicryl was placed around the fascial opening.  The Hasson cannula was inserted and  secured with the stay suture.  Pneumoperitoneum was then created with CO2 to a pressure of 15 mm Hg and tolerated well without any adverse changes in the patient's vital signs. An 10-mm port was placed in the subxiphoid position.  Two 5-mm ports were placed in the right upper quadrant. All skin incisions were infiltrated with a local anesthetic agent before making the incision and placing the trocars.   We positioned the patient in reverse Trendelenburg, tilted slightly to the patient's left.  The gallbladder was identified, the fundus grasped and retracted cephalad. Adhesions were lysed bluntly and with the electrocautery where indicated, taking care not to injure any adjacent organs or viscus. The infundibulum was grasped and retracted laterally, exposing the peritoneum overlying the triangle of Calot. The cystic duct was clearly identified and bluntly dissected circumferentially. This was then divided and exposed in a blunt fashion. A critical view of the cystic duct and cystic artery was obtained.  The cystic duct was ligated with one clips distally and four clips medially. The cystic duct was then divided. The cystic artery was identified, dissected free, ligated with clips and divided as well.   The gallbladder was dissected from the liver bed in retrograde fashion with the electrocautery. The gallbladder was removed and placed in an Endocatch bag.  The gallbladder and Endocatch bag were then removed through the umbilical port site.  The liver bed was inspected. Hemostasis was achieved with the electrocautery. There was no bile spillage so we did not irrigate the liver bed.  We again inspected the right upper quadrant for hemostasis.  Pneumoperitoneum was released as we removed the trocars.   The pursestring suture was used to close the umbilical fascia.  4-0 Monocryl was used to close the skin.   The skin was cleaned and dry, and Dermabond was  applied. The patient was then extubated and brought to the  recovery room in stable condition. Instrument, sponge, and needle counts were correct at closure and at the conclusion of the case.   Findings: Gallbladder with gallstones.    Estimated Blood Loss: 10cc         Drains: None          Specimens: Gallbladder to pathology       Complications: None; patient tolerated the procedure well.         Disposition: PACU - hemodynamically stable.         Condition: stable

## 2020-08-24 ENCOUNTER — Encounter (HOSPITAL_COMMUNITY): Payer: Self-pay | Admitting: General Surgery

## 2020-08-24 LAB — SURGICAL PATHOLOGY

## 2020-10-13 ENCOUNTER — Ambulatory Visit: Payer: Medicare PPO | Admitting: Sports Medicine

## 2020-10-13 ENCOUNTER — Encounter: Payer: Self-pay | Admitting: Sports Medicine

## 2020-10-13 ENCOUNTER — Other Ambulatory Visit: Payer: Self-pay

## 2020-10-13 DIAGNOSIS — B351 Tinea unguium: Secondary | ICD-10-CM | POA: Diagnosis not present

## 2020-10-13 DIAGNOSIS — M79675 Pain in left toe(s): Secondary | ICD-10-CM | POA: Diagnosis not present

## 2020-10-13 DIAGNOSIS — M79674 Pain in right toe(s): Secondary | ICD-10-CM | POA: Diagnosis not present

## 2020-10-13 DIAGNOSIS — E119 Type 2 diabetes mellitus without complications: Secondary | ICD-10-CM | POA: Diagnosis not present

## 2020-10-13 DIAGNOSIS — M2041 Other hammer toe(s) (acquired), right foot: Secondary | ICD-10-CM

## 2020-10-13 DIAGNOSIS — M2042 Other hammer toe(s) (acquired), left foot: Secondary | ICD-10-CM

## 2020-10-13 NOTE — Progress Notes (Signed)
Subjective: Catherine [name] is a 70 y.o. female patient with history of diabetes who is diet controlled not on any medication presents to office today for follow-up after having 6 laser treatments.  Patient reports that the laser has seemed to help her toenails but still has a few nails that are thick on the it is difficult to trim.  Patient also reports that she is using the topical medicine every other day and is noticing improvement.  Patient denies any other pedal complaints at this time.  Patient Active Problem List   Diagnosis Date Noted   Chronic cholecystitis with calculus 07/18/2020   H/O lumpectomy 05/04/2020   Chronic vulvitis 05/04/2020   Hardening of the aorta (main artery of the heart) ([place]) 05/03/2020   Iron deficiency 08/23/2016   Iron deficiency anemia 05/23/2016   LLQ pain 05/02/2012   Osteopenia    GERD (gastroesophageal reflux disease)    Hyperplastic polyps of stomach    Asthma without status asthmaticus    Gastritis    Hypothyroid    DIABETES MELLITUS 12/11/2007   BARRETTS ESOPHAGUS 12/11/2007   DIVERTICULOSIS, COLON 12/11/2007   IRRITABLE BOWEL SYNDROME 12/11/2007   ANAL FISSURE, HX OF 12/11/2007   HYPERLIPIDEMIA 04/11/2007   ANXIETY STATE, UNSPECIFIED 04/11/2007   Current Outpatient Medications on File Prior to Visit  Medication Sig Dispense Refill   ACCU-CHEK GUIDE test strip      albuterol (PROAIR HFA) 108 (90 Base) MCG/ACT inhaler Inhale 1-2 puffs into the lungs every 6 (six) hours as needed. (Patient taking differently: Inhale 1-2 puffs into the lungs every 6 (six) hours as needed for shortness of breath or wheezing.) 8 g 2   betamethasone dipropionate 0.05 % cream Apply 1 application topically daily as needed for rash.     bismuth subsalicylate (PEPTO BISMOL) 262 MG/15ML suspension Take 30 mLs by mouth every 6 (six) hours as needed for indigestion.     cholecalciferol (VITAMIN D3) 25 MCG (1000 UNIT) tablet Take 2,000 Units by mouth daily.     clobetasol  ointment (TEMOVATE) 0.05 % Apply topically 2 (two) times daily. Use only when has symptoms and do not use for more than 5 days. (Patient taking differently: Apply 1 application topically 2 (two) times daily as needed (rash). Use only when has symptoms and do not use for more than 5 days.) 30 g 1   colestipol (COLESTID) 1 g tablet Take by mouth.     desoximetasone (TOPICORT) 0.25 % cream Apply 1 application topically daily as needed (Arm rash).     dicyclomine (BENTYL) 20 MG tablet Take 1 tablet (20 mg total) by mouth 2 (two) times daily. (Patient taking differently: Take 20 mg by mouth 2 (two) times daily as needed for spasms.) 20 tablet 0   esomeprazole (NEXIUM) 40 MG capsule Take 1 capsule (40 mg total) by mouth 2 (two) times daily. (Patient taking differently: Take 40 mg by mouth daily.) 60 capsule 10   estradiol (ESTRACE) 0.1 MG/GM vaginal cream USE PEA SIZED AMOUNT OF CREAM EXTERNALLY TWO TIMES WEEKLY (Patient taking differently: USE PEA SIZED AMOUNT OF CREAM EXTERNALLY EVERY MON, WED, AND SAT) 42.5 g 2   fexofenadine (ALLEGRA) 180 MG tablet Take 180 mg by mouth at bedtime as needed for allergies.     fluticasone (FLONASE) 50 MCG/ACT nasal spray Place 1 spray into both nostrils daily as needed for allergies.  11   levothyroxine (SYNTHROID, LEVOTHROID) 25 MCG tablet Take 25-50 mcg by mouth See admin instructions. Take 50 mcg  daily on Mon, Tues, Wed, Thurs, and Fri. Take 25 mcg daily on Sat and Sun     loratadine (CLARITIN) 10 MG tablet Take 10 mg by mouth daily as needed for allergies.     metFORMIN (GLUCOPHAGE-XR) 500 MG 24 hr tablet Take 500 mg by mouth daily.     naproxen sodium (ALEVE) 220 MG tablet Take 440 mg by mouth 2 (two) times daily as needed (pain).     ondansetron (ZOFRAN ODT) 4 MG disintegrating tablet Take 1 tablet (4 mg total) by mouth every 8 (eight) hours as needed for nausea or vomiting. 20 tablet 0   oxyCODONE (OXY IR/ROXICODONE) 5 MG immediate release tablet Take 1 tablet (5 mg  total) by mouth every 6 (six) hours as needed for severe pain. 5 tablet 0   Polyethyl Glycol-Propyl Glycol 0.4-0.3 % SOLN Place 1 drop into both eyes at bedtime.     Probiotic Product (VSL#3 PO) Take 1 capsule by mouth daily.     simvastatin (ZOCOR) 10 MG tablet Take 10 mg by mouth daily.     Tea Tree Oil (NO FUNGUS NAIL PROTECTANT EX) Apply 1 drop topically 3 (three) times a week.     No current facility-administered medications on file prior to visit.   Allergies  Allergen Reactions   Chocolate Hives and Itching   Erythromycin Diarrhea   Iron Itching   Pneumococcal Vaccines     Fever    Prednisone     Stomach burning    Shrimp [Shellfish Allergy]     hives   Sulfa Antibiotics Diarrhea    Recent Results (from the past 2160 hour(s))  Glucose, capillary     Status: Abnormal   Collection Time: 08/17/20  2:42 PM  Result Value Ref Range   Glucose-Capillary 124 (H) 70 - 99 mg/dL    Comment: Glucose reference range applies only to samples taken after fasting for at least 8 hours.  CBC WITH DIFFERENTIAL     Status: None   Collection Time: 08/17/20  4:25 PM  Result Value Ref Range   WBC 8.9 4.0 - 10.5 K/uL   RBC 4.50 3.87 - 5.11 MIL/uL   Hemoglobin 12.6 12.0 - 15.0 g/dL   HCT 39.2 36.0 - 46.0 %   MCV 87.1 80.0 - 100.0 fL   MCH 28.0 26.0 - 34.0 pg   MCHC 32.1 30.0 - 36.0 g/dL   RDW 14.8 11.5 - 15.5 %   Platelets 264 150 - 400 K/uL   nRBC 0.0 0.0 - 0.2 %   Neutrophils Relative % 45 %   Neutro Abs 4.0 1.7 - 7.7 K/uL   Lymphocytes Relative 45 %   Lymphs Abs 4.0 0.7 - 4.0 K/uL   Monocytes Relative 5 %   Monocytes Absolute 0.5 0.1 - 1.0 K/uL   Eosinophils Relative 4 %   Eosinophils Absolute 0.4 0.0 - 0.5 K/uL   Basophils Relative 1 %   Basophils Absolute 0.1 0.0 - 0.1 K/uL   Immature Granulocytes 0 %   Abs Immature Granulocytes 0.04 0.00 - 0.07 K/uL    Comment: Performed at [facility], [address], [place], [place] [address]  Comprehensive metabolic panel      Status: None   Collection Time: 08/17/20  4:25 PM  Result Value Ref Range   Sodium 138 135 - 145 mmol/L   Potassium 3.8 3.5 - 5.1 mmol/L   Chloride 106 98 - 111 mmol/L   CO2 26 22 - 32 mmol/L  Glucose, Bld 97 70 - 99 mg/dL    Comment: Glucose reference range applies only to samples taken after fasting for at least 8 hours.   BUN 11 8 - 23 mg/dL   Creatinine, Ser 0.76 0.44 - 1.00 mg/dL   Calcium 9.5 8.9 - 10.3 mg/dL   Total Protein 6.8 6.5 - 8.1 g/dL   Albumin 3.8 3.5 - 5.0 g/dL   AST 15 15 - 41 U/L   ALT 16 0 - 44 U/L   Alkaline Phosphatase 80 38 - 126 U/L   Total Bilirubin 0.5 0.3 - 1.2 mg/dL   GFR, Estimated >60 >60 mL/min    Comment: (NOTE) Calculated using the CKD-EPI Creatinine Equation (2021)    Anion gap 6 5 - 15    Comment: Performed at [facility] [address], [place], [place] [address]  Hemoglobin A1c per protocol     Status: Abnormal   Collection Time: 08/17/20  4:25 PM  Result Value Ref Range   Hgb A1c MFr Bld 6.3 (H) 4.8 - 5.6 %    Comment: (NOTE) Pre diabetes:          5.7%-6.4%  Diabetes:              >6.4%  Glycemic control for   <7.0% adults with diabetes    Mean Plasma Glucose 134.11 mg/dL    Comment: Performed at [facility] [address], [place], [place] [address]  Glucose, capillary     Status: None   Collection Time: 08/23/20  7:10 AM  Result Value Ref Range   Glucose-Capillary 95 70 - 99 mg/dL    Comment: Glucose reference range applies only to samples taken after fasting for at least 8 hours.  Surgical pathology     Status: None   Collection Time: 08/23/20  9:03 AM  Result Value Ref Range   SURGICAL PATHOLOGY      SURGICAL PATHOLOGY CASE: [id number] PATIENT: [facility] Surgical Pathology Report     Clinical History: chronic cholecystitis with stones (cm)     FINAL MICROSCOPIC DIAGNOSIS:  A. GALLBLADDER, CHOLECYSTECTOMY: - Chronic cholecystitis and cholelithiasis     GROSS  DESCRIPTION:  Size/?Intact: An intact gallbladder measuring 9.8 x 2.4 x 2.0 cm Serosal surface: Tan-purple, smooth, hyperemic Mucosa/Wall: Mucosa is tan-yellow, and the wall measures 0.2 cm in thickness Contents: Gallbladder contains a moderate amount of green-yellow bile, and multiple green-black, irregular calculi ranging from 0.4 to 1.5 cm in greatest dimension Cystic duct: 0.2 cm in diameter, with a 0.4 cm calculus lodged within the neck of the gallbladder Block Summary: 1 block submitted  Craig Staggers 08/23/2020)    Final Diagnosis performed by Jaquita Folds, MD.   Electronically signed 08/24/2020 Technical component performed at [employer]. [facility], [place] [address] [place], [place] [address].  Professional component performed at [facility], [place] [address], [place], [place] [address].  Immunohistochemistry Technical component (if applicable) was performed at [facility]. [address], [place], [place], [place] [address].   IMMUNOHISTOCHEMISTRY DISCLAIMER (if applicable): Some of these immunohistochemical stains may have been developed and the performance characteristics determine by [facility]. Some may not have been cleared or approved by the U.S. Food and Drug Administration. The FDA has determined that such clearance or approval is not necessary. This test is used for clinical purposes. It should not be regarded as investigational or for research. This laboratory is certified under the Clinical Laboratory Improvement Amendments  of 1988 (CLIA-88) as qualified to perform high complexity clinical laboratory testing.  The controls stained appropriately.   Glucose, capillary     Status: Abnormal   Collection Time: 08/23/20 10:52 AM  Result Value Ref Range   Glucose-Capillary 145 (H) 70 - 99 mg/dL    Comment: Glucose reference range applies only to samples taken after fasting for at least 8 hours.     Objective: General: Patient is awake, alert, and oriented x 3 and in no acute distress.  Integument: Skin is warm, dry and supple bilateral. Nails are tender, long, thickened and dystrophic at the distal ends with subungual debris, consistent with treated onychomycosis, 1-5 bilateral. No signs of infection. No open lesions or preulcerative lesions present bilateral. Remaining integument unremarkable.  Vasculature:  Dorsalis Pedis pulse 1/4 bilateral. Posterior Tibial pulse 1 /4 bilateral.  Capillary fill time <3 sec 1-5 bilateral. Positive hair growth to the level of the digits. Temperature gradient within normal limits. No varicosities present bilateral. No edema present bilateral.   Neurology: Gross sensation present via light touch bilateral.  Musculoskeletal: Asymptomatic hammertoe pedal deformities noted bilateral. Muscular strength 5/5 in all lower extremity muscular groups bilateral without pain on range of motion . No tenderness with calf compression bilateral.  Assessment and Plan: Problem List Items Addressed This Visit   None Visit Diagnoses     Pain due to onychomycosis of toenails of both feet    -  Primary   Pain around toenail, right foot       Hammer toes of both feet       Diabetes mellitus without complication ([place])           -Examined patient. -Discussed and educated patient on diabetic foot care -Mechanically debrided all nails 1-5 bilateral using sterile nail nipper and filed with dremel without incident  -Advised patient to continue with topical every other day -At this time toenails are much improved she does not need any more laser treatments advised patient that we will let her nails continue to grow out over the next year and continue to monitor before we would consider any additional treatments -Answered all patient questions -Patient to return  in 3 months for at risk foot care -Patient advised to call the office if any problems or questions arise in  the meantime.  Landis Martins, DPM

## 2020-11-23 ENCOUNTER — Encounter (HOSPITAL_BASED_OUTPATIENT_CLINIC_OR_DEPARTMENT_OTHER): Payer: Self-pay | Admitting: *Deleted

## 2021-01-19 ENCOUNTER — Ambulatory Visit: Payer: Medicare PPO | Admitting: Sports Medicine

## 2021-01-26 ENCOUNTER — Ambulatory Visit: Payer: Medicare PPO | Admitting: Sports Medicine

## 2021-02-09 ENCOUNTER — Ambulatory Visit: Payer: Medicare PPO | Admitting: Sports Medicine

## 2021-03-02 ENCOUNTER — Ambulatory Visit: Payer: Medicare PPO | Admitting: Sports Medicine

## 2021-03-08 ENCOUNTER — Encounter: Payer: Self-pay | Admitting: Obstetrics and Gynecology

## 2021-03-09 ENCOUNTER — Encounter: Payer: Self-pay | Admitting: Sports Medicine

## 2021-03-09 ENCOUNTER — Ambulatory Visit: Payer: Medicare PPO | Admitting: Sports Medicine

## 2021-03-09 ENCOUNTER — Other Ambulatory Visit: Payer: Self-pay

## 2021-03-09 DIAGNOSIS — E119 Type 2 diabetes mellitus without complications: Secondary | ICD-10-CM | POA: Diagnosis not present

## 2021-03-09 DIAGNOSIS — M79675 Pain in left toe(s): Secondary | ICD-10-CM | POA: Diagnosis not present

## 2021-03-09 DIAGNOSIS — M79674 Pain in right toe(s): Secondary | ICD-10-CM

## 2021-03-09 DIAGNOSIS — B351 Tinea unguium: Secondary | ICD-10-CM | POA: Diagnosis not present

## 2021-03-09 NOTE — Progress Notes (Addendum)
? Subjective: ?Catherine [name] is a 71 y.o. female patient with history of diabetes who is diet controlled not on any medication presents to office today for nail trim.  States that she can not get all of her polish off. No other pedal complaints noted.  ? ?FBS 105 A1c 6.2 PCP visit Jonathon Jordan, MD Jan 2023. ? ?Patient Active Problem List  ? Diagnosis Date Noted  ? Chronic cholecystitis with calculus 07/18/2020  ? H/O lumpectomy 05/04/2020  ? Chronic vulvitis 05/04/2020  ? Hardening of the aorta (main artery of the heart) ([place]) 05/03/2020  ? Iron deficiency 08/23/2016  ? Iron deficiency anemia 05/23/2016  ? LLQ pain 05/02/2012  ? Osteopenia   ? GERD (gastroesophageal reflux disease)   ? Hyperplastic polyps of stomach   ? Asthma without status asthmaticus   ? Gastritis   ? Hypothyroid   ? DIABETES MELLITUS 12/11/2007  ? BARRETTS ESOPHAGUS 12/11/2007  ? DIVERTICULOSIS, COLON 12/11/2007  ? IRRITABLE BOWEL SYNDROME 12/11/2007  ? ANAL FISSURE, HX OF 12/11/2007  ? HYPERLIPIDEMIA 04/11/2007  ? ANXIETY STATE, UNSPECIFIED 04/11/2007  ? ?Current Outpatient Medications on File Prior to Visit  ?Medication Sig Dispense Refill  ? ACCU-CHEK GUIDE test strip     ? albuterol (PROAIR HFA) 108 (90 Base) MCG/ACT inhaler Inhale 1-2 puffs into the lungs every 6 (six) hours as needed. (Patient taking differently: Inhale 1-2 puffs into the lungs every 6 (six) hours as needed for shortness of breath or wheezing.) 8 g 2  ? betamethasone dipropionate 0.05 % cream Apply 1 application topically daily as needed for rash.    ? bismuth subsalicylate (PEPTO BISMOL) 262 MG/15ML suspension Take 30 mLs by mouth every 6 (six) hours as needed for indigestion.    ? cholecalciferol (VITAMIN D3) 25 MCG (1000 UNIT) tablet Take 2,000 Units by mouth daily.    ? clobetasol ointment (TEMOVATE) 0.05 % Apply topically 2 (two) times daily. Use only when has symptoms and do not use for more than 5 days. (Patient taking differently: Apply 1 application topically  2 (two) times daily as needed (rash). Use only when has symptoms and do not use for more than 5 days.) 30 g 1  ? colestipol (COLESTID) 1 g tablet Take by mouth.    ? desoximetasone (TOPICORT) 0.25 % cream Apply 1 application topically daily as needed (Arm rash).    ? dicyclomine (BENTYL) 20 MG tablet Take 1 tablet (20 mg total) by mouth 2 (two) times daily. (Patient taking differently: Take 20 mg by mouth 2 (two) times daily as needed for spasms.) 20 tablet 0  ? esomeprazole (NEXIUM) 40 MG capsule Take 1 capsule (40 mg total) by mouth 2 (two) times daily. (Patient taking differently: Take 40 mg by mouth daily.) 60 capsule 10  ? estradiol (ESTRACE) 0.1 MG/GM vaginal cream USE PEA SIZED AMOUNT OF CREAM EXTERNALLY TWO TIMES WEEKLY (Patient taking differently: USE PEA SIZED AMOUNT OF CREAM EXTERNALLY EVERY MON, WED, AND SAT) 42.5 g 2  ? fexofenadine (ALLEGRA) 180 MG tablet Take 180 mg by mouth at bedtime as needed for allergies.    ? fluticasone (FLONASE) 50 MCG/ACT nasal spray Place 1 spray into both nostrils daily as needed for allergies.  11  ? levothyroxine (SYNTHROID, LEVOTHROID) 25 MCG tablet Take 25-50 mcg by mouth See admin instructions. Take 50 mcg daily on Mon, Tues, Wed, Thurs, and Fri. Take 25 mcg daily on Sat and Sun    ? loratadine (CLARITIN) 10 MG tablet Take 10 mg by  mouth daily as needed for allergies.    ? metFORMIN (GLUCOPHAGE-XR) 500 MG 24 hr tablet Take 500 mg by mouth daily.    ? naproxen sodium (ALEVE) 220 MG tablet Take 440 mg by mouth 2 (two) times daily as needed (pain).    ? ondansetron (ZOFRAN ODT) 4 MG disintegrating tablet Take 1 tablet (4 mg total) by mouth every 8 (eight) hours as needed for nausea or vomiting. 20 tablet 0  ? oxyCODONE (OXY IR/ROXICODONE) 5 MG immediate release tablet Take 1 tablet (5 mg total) by mouth every 6 (six) hours as needed for severe pain. 5 tablet 0  ? Polyethyl Glycol-Propyl Glycol 0.4-0.3 % SOLN Place 1 drop into both eyes at bedtime.    ? Probiotic Product  (VSL#3 PO) Take 1 capsule by mouth daily.    ? simvastatin (ZOCOR) 10 MG tablet Take 10 mg by mouth daily.    ? Tea Tree Oil (NO FUNGUS NAIL PROTECTANT EX) Apply 1 drop topically 3 (three) times a week.    ? ?No current facility-administered medications on file prior to visit.  ? ?Allergies  ?Allergen Reactions  ? Chocolate Hives and Itching  ? Erythromycin Diarrhea  ? Iron Itching  ? Pneumococcal Vaccines   ?  Fever   ? Prednisone   ?  Stomach burning   ? Shrimp [Shellfish Allergy]   ?  hives  ? Sulfa Antibiotics Diarrhea  ? ? ?No results found for this or any previous visit (from the past 2160 hour(s)). ? ? ?Objective: ?General: Patient is awake, alert, and oriented x 3 and in no acute distress. ? ?Integument: Skin is warm, dry and supple bilateral. Nails are tender, long, thickened and dystrophic at the distal ends with minimal subungual debris, consistent with treated onychomycosis, 1-5 bilateral. No signs of infection. No open lesions or preulcerative lesions present bilateral. Remaining integument unremarkable. ? ?Vasculature:  Dorsalis Pedis pulse 1/4 bilateral. Posterior Tibial pulse 1 /4 bilateral.  ?Capillary fill time <3 sec 1-5 bilateral. Positive hair growth to the level of the digits. ?Temperature gradient within normal limits. No varicosities present bilateral. No edema present bilateral.  ? ?Neurology: Gross sensation present via light touch bilateral. ? ?Musculoskeletal: Asymptomatic hammertoe pedal deformities noted bilateral. Muscular strength 5/5 in all lower extremity muscular groups bilateral without pain on range of motion . No tenderness with calf compression bilateral. ? ?Assessment and Plan: ?Problem List Items Addressed This Visit   ?None ?Visit Diagnoses   ? ? Pain due to onychomycosis of toenails of both feet    -  Primary  ? Diabetes mellitus without complication ([place])      ? ?  ? ? ?-Examined patient. ?-Discussed and educated patient on diabetic foot care ?-Mechanically debrided all  painful nails 1-5 bilateral using sterile nail nipper and filed with dremel without incident  ?-Advised patient to continue with topical every other day like before as preventative ?-Answered all patient questions ?-Patient to return  in 3 months for at risk foot care ?-Patient advised to call the office if any problems or questions arise in the meantime. ? ?Landis Martins, DPM ? ?

## 2021-04-11 DIAGNOSIS — M25561 Pain in right knee: Secondary | ICD-10-CM | POA: Diagnosis not present

## 2021-04-11 DIAGNOSIS — M1990 Unspecified osteoarthritis, unspecified site: Secondary | ICD-10-CM | POA: Diagnosis not present

## 2021-04-11 DIAGNOSIS — M79643 Pain in unspecified hand: Secondary | ICD-10-CM | POA: Diagnosis not present

## 2021-04-11 DIAGNOSIS — M79642 Pain in left hand: Secondary | ICD-10-CM | POA: Diagnosis not present

## 2021-04-11 DIAGNOSIS — M25562 Pain in left knee: Secondary | ICD-10-CM | POA: Diagnosis not present

## 2021-04-11 DIAGNOSIS — M79641 Pain in right hand: Secondary | ICD-10-CM | POA: Diagnosis not present

## 2021-05-11 DIAGNOSIS — E611 Iron deficiency: Secondary | ICD-10-CM | POA: Diagnosis not present

## 2021-05-11 DIAGNOSIS — E039 Hypothyroidism, unspecified: Secondary | ICD-10-CM | POA: Diagnosis not present

## 2021-05-11 DIAGNOSIS — E1165 Type 2 diabetes mellitus with hyperglycemia: Secondary | ICD-10-CM | POA: Diagnosis not present

## 2021-05-18 DIAGNOSIS — E7800 Pure hypercholesterolemia, unspecified: Secondary | ICD-10-CM | POA: Diagnosis not present

## 2021-05-18 DIAGNOSIS — E559 Vitamin D deficiency, unspecified: Secondary | ICD-10-CM | POA: Diagnosis not present

## 2021-05-18 DIAGNOSIS — E039 Hypothyroidism, unspecified: Secondary | ICD-10-CM | POA: Diagnosis not present

## 2021-05-18 DIAGNOSIS — M1990 Unspecified osteoarthritis, unspecified site: Secondary | ICD-10-CM | POA: Diagnosis not present

## 2021-05-18 DIAGNOSIS — E611 Iron deficiency: Secondary | ICD-10-CM | POA: Diagnosis not present

## 2021-05-18 DIAGNOSIS — E1165 Type 2 diabetes mellitus with hyperglycemia: Secondary | ICD-10-CM | POA: Diagnosis not present

## 2021-06-07 DIAGNOSIS — H40013 Open angle with borderline findings, low risk, bilateral: Secondary | ICD-10-CM | POA: Diagnosis not present

## 2021-06-07 DIAGNOSIS — H353131 Nonexudative age-related macular degeneration, bilateral, early dry stage: Secondary | ICD-10-CM | POA: Diagnosis not present

## 2021-06-07 DIAGNOSIS — E119 Type 2 diabetes mellitus without complications: Secondary | ICD-10-CM | POA: Diagnosis not present

## 2021-06-07 DIAGNOSIS — H524 Presbyopia: Secondary | ICD-10-CM | POA: Diagnosis not present

## 2021-06-07 DIAGNOSIS — H04123 Dry eye syndrome of bilateral lacrimal glands: Secondary | ICD-10-CM | POA: Diagnosis not present

## 2021-06-13 ENCOUNTER — Ambulatory Visit: Payer: Medicare PPO | Admitting: Podiatry

## 2021-06-20 DIAGNOSIS — M25561 Pain in right knee: Secondary | ICD-10-CM | POA: Diagnosis not present

## 2021-06-23 DIAGNOSIS — S83241D Other tear of medial meniscus, current injury, right knee, subsequent encounter: Secondary | ICD-10-CM | POA: Diagnosis not present

## 2021-06-23 DIAGNOSIS — M25561 Pain in right knee: Secondary | ICD-10-CM | POA: Diagnosis not present

## 2021-06-28 ENCOUNTER — Ambulatory Visit (INDEPENDENT_AMBULATORY_CARE_PROVIDER_SITE_OTHER): Payer: Medicare PPO | Admitting: Podiatry

## 2021-06-28 ENCOUNTER — Encounter: Payer: Self-pay | Admitting: Podiatry

## 2021-06-28 DIAGNOSIS — M79674 Pain in right toe(s): Secondary | ICD-10-CM

## 2021-06-28 DIAGNOSIS — B351 Tinea unguium: Secondary | ICD-10-CM

## 2021-06-28 DIAGNOSIS — E119 Type 2 diabetes mellitus without complications: Secondary | ICD-10-CM | POA: Diagnosis not present

## 2021-06-28 DIAGNOSIS — M79675 Pain in left toe(s): Secondary | ICD-10-CM

## 2021-06-28 NOTE — Progress Notes (Signed)
Subjective: Catherine [name] is a 71 y.o. female patient with history of diabetes who is diet controlled not on any medication presents to office today for nail trim.  No other pedal complaints noted.   FBS 105 A1c 6.3 PCP visit Jonathon Jordan, MD   Patient Active Problem List   Diagnosis Date Noted   Chronic cholecystitis with calculus 07/18/2020   H/O lumpectomy 05/04/2020   Chronic vulvitis 05/04/2020   Hardening of the aorta (main artery of the heart) ([place]) 05/03/2020   Iron deficiency 08/23/2016   Iron deficiency anemia 05/23/2016   LLQ pain 05/02/2012   Osteopenia    GERD (gastroesophageal reflux disease)    Hyperplastic polyps of stomach    Asthma without status asthmaticus    Gastritis    Hypothyroid    DIABETES MELLITUS 12/11/2007   BARRETTS ESOPHAGUS 12/11/2007   DIVERTICULOSIS, COLON 12/11/2007   IRRITABLE BOWEL SYNDROME 12/11/2007   ANAL FISSURE, HX OF 12/11/2007   HYPERLIPIDEMIA 04/11/2007   ANXIETY STATE, UNSPECIFIED 04/11/2007   Current Outpatient Medications on File Prior to Visit  Medication Sig Dispense Refill   ACCU-CHEK GUIDE test strip      albuterol (PROAIR HFA) 108 (90 Base) MCG/ACT inhaler Inhale 1-2 puffs into the lungs every 6 (six) hours as needed. (Patient taking differently: Inhale 1-2 puffs into the lungs every 6 (six) hours as needed for shortness of breath or wheezing.) 8 g 2   betamethasone dipropionate 0.05 % cream Apply 1 application topically daily as needed for rash.     bismuth subsalicylate (PEPTO BISMOL) 262 MG/15ML suspension Take 30 mLs by mouth every 6 (six) hours as needed for indigestion.     cholecalciferol (VITAMIN D3) 25 MCG (1000 UNIT) tablet Take 2,000 Units by mouth daily.     cholestyramine (QUESTRAN) 4 g packet Take by mouth.     clobetasol ointment (TEMOVATE) 0.05 % Apply topically 2 (two) times daily. Use only when has symptoms and do not use for more than 5 days. (Patient taking differently: Apply 1 application topically 2  (two) times daily as needed (rash). Use only when has symptoms and do not use for more than 5 days.) 30 g 1   colestipol (COLESTID) 1 g tablet Take by mouth.     desoximetasone (TOPICORT) 0.25 % cream Apply 1 application topically daily as needed (Arm rash).     dicyclomine (BENTYL) 20 MG tablet Take 1 tablet (20 mg total) by mouth 2 (two) times daily. (Patient taking differently: Take 20 mg by mouth 2 (two) times daily as needed for spasms.) 20 tablet 0   esomeprazole (NEXIUM) 40 MG capsule Take 1 capsule (40 mg total) by mouth 2 (two) times daily. (Patient taking differently: Take 40 mg by mouth daily.) 60 capsule 10   estradiol (ESTRACE) 0.1 MG/GM vaginal cream USE PEA SIZED AMOUNT OF CREAM EXTERNALLY TWO TIMES WEEKLY (Patient taking differently: USE PEA SIZED AMOUNT OF CREAM EXTERNALLY EVERY MON, WED, AND SAT) 42.5 g 2   fexofenadine (ALLEGRA) 180 MG tablet Take 180 mg by mouth at bedtime as needed for allergies.     fluticasone (FLONASE) 50 MCG/ACT nasal spray Place 1 spray into both nostrils daily as needed for allergies.  11   levothyroxine (SYNTHROID, LEVOTHROID) 25 MCG tablet Take 25-50 mcg by mouth See admin instructions. Take 50 mcg daily on Mon, Tues, Wed, Thurs, and Fri. Take 25 mcg daily on Sat and Sun     loratadine (CLARITIN) 10 MG tablet Take 10 mg by  mouth daily as needed for allergies.     metFORMIN (GLUCOPHAGE-XR) 500 MG 24 hr tablet Take 500 mg by mouth daily.     naproxen sodium (ALEVE) 220 MG tablet Take 440 mg by mouth 2 (two) times daily as needed (pain).     ondansetron (ZOFRAN ODT) 4 MG disintegrating tablet Take 1 tablet (4 mg total) by mouth every 8 (eight) hours as needed for nausea or vomiting. 20 tablet 0   oxyCODONE (OXY IR/ROXICODONE) 5 MG immediate release tablet Take 1 tablet (5 mg total) by mouth every 6 (six) hours as needed for severe pain. 5 tablet 0   Polyethyl Glycol-Propyl Glycol 0.4-0.3 % SOLN Place 1 drop into both eyes at bedtime.     Probiotic Product  (VSL#3 PO) Take 1 capsule by mouth daily.     simvastatin (ZOCOR) 10 MG tablet Take 10 mg by mouth daily.     Tea Tree Oil (NO FUNGUS NAIL PROTECTANT EX) Apply 1 drop topically 3 (three) times a week.     No current facility-administered medications on file prior to visit.   Allergies  Allergen Reactions   Chocolate Hives and Itching   Erythromycin Diarrhea   Iron Itching   Pneumococcal Vaccines     Fever    Prednisone     Stomach burning    Shrimp [Shellfish Allergy]     hives   Sulfa Antibiotics Diarrhea    No results found for this or any previous visit (from the past 2160 hour(s)).   Objective: General: Patient is awake, alert, and oriented x 3 and in no acute distress.  Integument: Skin is warm, dry and supple bilateral. Nails are tender, long, thickened and dystrophic at the distal ends with minimal subungual debris, consistent with treated onychomycosis, 1-5 bilateral. No signs of infection. No open lesions or preulcerative lesions present bilateral. Remaining integument unremarkable.  Vasculature:  Dorsalis Pedis pulse 1/4 bilateral. Posterior Tibial pulse 1 /4 bilateral.  Capillary fill time <3 sec 1-5 bilateral. Positive hair growth to the level of the digits. Temperature gradient within normal limits. No varicosities present bilateral. No edema present bilateral.   Neurology: Gross sensation present via light touch bilateral.  Musculoskeletal: Asymptomatic hammertoe pedal deformities noted bilateral. Muscular strength 5/5 in all lower extremity muscular groups bilateral without pain on range of motion . No tenderness with calf compression bilateral.  Assessment and Plan: Problem List Items Addressed This Visit   None Visit Diagnoses     Pain due to onychomycosis of toenails of both feet    -  Primary   Diabetes mellitus without complication ([place])           -Examined patient. -Discussed and educated patient on diabetic foot care -Mechanically debrided all  painful nails 1-5 bilateral using sterile nail nipper and filed with dremel without incident  -Advised patient to continue with topical every other day like before as preventative -Answered all patient questions -Patient to return  in 3 months for at risk foot care -Patient advised to call the office if any problems or questions arise in the meantime.  Lorenda Peck, DPM

## 2021-07-18 ENCOUNTER — Encounter (HOSPITAL_COMMUNITY): Payer: Self-pay

## 2021-07-20 DIAGNOSIS — M25561 Pain in right knee: Secondary | ICD-10-CM | POA: Diagnosis not present

## 2021-07-20 DIAGNOSIS — S83241D Other tear of medial meniscus, current injury, right knee, subsequent encounter: Secondary | ICD-10-CM | POA: Diagnosis not present

## 2021-08-08 HISTORY — PX: KNEE ARTHROSCOPY: SUR90

## 2021-08-17 DIAGNOSIS — M1712 Unilateral primary osteoarthritis, left knee: Secondary | ICD-10-CM | POA: Diagnosis not present

## 2021-08-17 DIAGNOSIS — M25562 Pain in left knee: Secondary | ICD-10-CM | POA: Diagnosis not present

## 2021-08-21 DIAGNOSIS — M94261 Chondromalacia, right knee: Secondary | ICD-10-CM | POA: Diagnosis not present

## 2021-08-21 DIAGNOSIS — M65861 Other synovitis and tenosynovitis, right lower leg: Secondary | ICD-10-CM | POA: Diagnosis not present

## 2021-08-21 DIAGNOSIS — S83261A Peripheral tear of lateral meniscus, current injury, right knee, initial encounter: Secondary | ICD-10-CM | POA: Diagnosis not present

## 2021-08-21 DIAGNOSIS — M794 Hypertrophy of (infrapatellar) fat pad: Secondary | ICD-10-CM | POA: Diagnosis not present

## 2021-08-21 DIAGNOSIS — S83231A Complex tear of medial meniscus, current injury, right knee, initial encounter: Secondary | ICD-10-CM | POA: Diagnosis not present

## 2021-08-21 DIAGNOSIS — M6751 Plica syndrome, right knee: Secondary | ICD-10-CM | POA: Diagnosis not present

## 2021-08-21 DIAGNOSIS — M659 Synovitis and tenosynovitis, unspecified: Secondary | ICD-10-CM | POA: Diagnosis not present

## 2021-08-21 DIAGNOSIS — G8918 Other acute postprocedural pain: Secondary | ICD-10-CM | POA: Diagnosis not present

## 2021-08-30 DIAGNOSIS — M25561 Pain in right knee: Secondary | ICD-10-CM | POA: Diagnosis not present

## 2021-09-06 DIAGNOSIS — M25561 Pain in right knee: Secondary | ICD-10-CM | POA: Diagnosis not present

## 2021-09-19 DIAGNOSIS — M25561 Pain in right knee: Secondary | ICD-10-CM | POA: Diagnosis not present

## 2021-09-23 DIAGNOSIS — H35413 Lattice degeneration of retina, bilateral: Secondary | ICD-10-CM | POA: Diagnosis not present

## 2021-09-23 DIAGNOSIS — H43811 Vitreous degeneration, right eye: Secondary | ICD-10-CM | POA: Diagnosis not present

## 2021-09-23 DIAGNOSIS — H43391 Other vitreous opacities, right eye: Secondary | ICD-10-CM | POA: Diagnosis not present

## 2021-09-25 DIAGNOSIS — Z23 Encounter for immunization: Secondary | ICD-10-CM | POA: Diagnosis not present

## 2021-09-28 ENCOUNTER — Encounter (INDEPENDENT_AMBULATORY_CARE_PROVIDER_SITE_OTHER): Payer: Medicare PPO | Admitting: Ophthalmology

## 2021-09-28 DIAGNOSIS — H353121 Nonexudative age-related macular degeneration, left eye, early dry stage: Secondary | ICD-10-CM | POA: Diagnosis not present

## 2021-09-28 DIAGNOSIS — H353112 Nonexudative age-related macular degeneration, right eye, intermediate dry stage: Secondary | ICD-10-CM | POA: Diagnosis not present

## 2021-09-28 DIAGNOSIS — H43811 Vitreous degeneration, right eye: Secondary | ICD-10-CM

## 2021-10-18 ENCOUNTER — Ambulatory Visit: Payer: Medicare PPO | Admitting: Podiatry

## 2021-11-02 ENCOUNTER — Ambulatory Visit (HOSPITAL_BASED_OUTPATIENT_CLINIC_OR_DEPARTMENT_OTHER): Payer: Medicare PPO | Admitting: Obstetrics & Gynecology

## 2021-11-02 ENCOUNTER — Encounter (HOSPITAL_BASED_OUTPATIENT_CLINIC_OR_DEPARTMENT_OTHER): Payer: Self-pay | Admitting: Obstetrics & Gynecology

## 2021-11-02 VITALS — BP 118/70 | HR 74 | Ht 62.5 in | Wt 146.2 lb

## 2021-11-02 DIAGNOSIS — R102 Pelvic and perineal pain: Secondary | ICD-10-CM

## 2021-11-02 DIAGNOSIS — N763 Subacute and chronic vulvitis: Secondary | ICD-10-CM | POA: Diagnosis not present

## 2021-11-02 LAB — POCT URINALYSIS DIPSTICK
Bilirubin, UA: NEGATIVE
Glucose, UA: NEGATIVE
Ketones, UA: NEGATIVE
Nitrite, UA: NEGATIVE
Protein, UA: NEGATIVE
Spec Grav, UA: 1.015 (ref 1.010–1.025)
Urobilinogen, UA: 0.2 E.U./dL
pH, UA: 6 (ref 5.0–8.0)

## 2021-11-02 MED ORDER — CLOBETASOL PROPIONATE 0.05 % EX OINT: TOPICAL_OINTMENT | Freq: Two times a day (BID) | CUTANEOUS | 1 refills | Status: DC

## 2021-11-02 MED ORDER — ESTRADIOL 0.1 MG/GM VA CREA: TOPICAL_CREAM | VAGINAL | 2 refills | Status: AC

## 2021-11-02 MED ORDER — NITROFURANTOIN MONOHYD MACRO 100 MG PO CAPS: 100.0000 mg | ORAL_CAPSULE | Freq: Two times a day (BID) | ORAL | 0 refills | Status: DC

## 2021-11-02 NOTE — Progress Notes (Signed)
GYNECOLOGY  VISIT  CC:   pelvic pain  HPI: 71 y.o. [id number] Married [name] here for complaint of lower pelvic pain, almost nerve like pain, that started about three days ago.  She describes this as almost a vibration.  She feels this sensation almost every time when she voids.  Denies vaginal bleeding or vaginal discharge or odor.  She does use vaginal estrogen cream twice weekly.    Has chronic vulvitis that has been under good control with vaginal estradiol cream.  She does have a little tenderness on the left that is present as well.  Has an uncoming trip and would like letter about topical treatment for vulvitis to have with her so she can carry the medications in her carry-on.   Past Medical History:  Diagnosis Date   Anal fissure    Anxiety    Arthritis    bilateral knees   Asthma    Barrett esophagus    normal now   Cholelithiasis    Diabetes mellitus    Diverticulosis    Dr Catherine [name]   Gastritis    GERD (gastroesophageal reflux disease)    Hyperlipidemia    Hyperplastic polyps of stomach    Hypothyroidism    IBS (irritable bowel syndrome)    Osteopenia    Recurrent UTI    seeing Dr Catherine [name]-recent culture negative    MEDS:   Current Outpatient Medications on File Prior to Visit  Medication Sig Dispense Refill   ACCU-CHEK GUIDE test strip      albuterol (PROAIR HFA) 108 (90 Base) MCG/ACT inhaler Inhale 1-2 puffs into the lungs every 6 (six) hours as needed. (Patient taking differently: Inhale 1-2 puffs into the lungs every 6 (six) hours as needed for shortness of breath or wheezing.) 8 g 2   betamethasone dipropionate 0.05 % cream Apply 1 application topically daily as needed for rash.     bismuth subsalicylate (PEPTO BISMOL) 262 MG/15ML suspension Take 30 mLs by mouth every 6 (six) hours as needed for indigestion.     cholecalciferol (VITAMIN D3) 25 MCG (1000 UNIT) tablet Take 2,000 Units by mouth daily.     cholestyramine (QUESTRAN) 4 g packet Take by mouth.      clobetasol ointment (TEMOVATE) 0.05 % Apply topically 2 (two) times daily. Use only when has symptoms and do not use for more than 5 days. (Patient taking differently: Apply 1 application  topically 2 (two) times daily as needed (rash). Use only when has symptoms and do not use for more than 5 days.) 30 g 1   desoximetasone (TOPICORT) 0.25 % cream Apply 1 application topically daily as needed (Arm rash).     dicyclomine (BENTYL) 20 MG tablet Take 1 tablet (20 mg total) by mouth 2 (two) times daily. (Patient taking differently: Take 20 mg by mouth 2 (two) times daily as needed for spasms.) 20 tablet 0   esomeprazole (NEXIUM) 40 MG capsule Take 1 capsule (40 mg total) by mouth 2 (two) times daily. (Patient taking differently: Take 40 mg by mouth daily.) 60 capsule 10   estradiol (ESTRACE) 0.1 MG/GM vaginal cream USE PEA SIZED AMOUNT OF CREAM EXTERNALLY TWO TIMES WEEKLY (Patient taking differently: USE PEA SIZED AMOUNT OF CREAM EXTERNALLY EVERY MON, WED, AND SAT) 42.5 g 2   fexofenadine (ALLEGRA) 180 MG tablet Take 180 mg by mouth at bedtime as needed for allergies.     fluticasone (FLONASE) 50 MCG/ACT nasal spray Place 1 spray into both nostrils daily as needed for  allergies.  11   levothyroxine (SYNTHROID, LEVOTHROID) 25 MCG tablet Take 25-50 mcg by mouth See admin instructions. Take 50 mcg daily on Mon, Tues, Wed, Thurs, and Fri. Take 25 mcg daily on Sat and Sun     loratadine (CLARITIN) 10 MG tablet Take 10 mg by mouth daily as needed for allergies.     metFORMIN (GLUCOPHAGE-XR) 500 MG 24 hr tablet Take 500 mg by mouth daily.     naproxen sodium (ALEVE) 220 MG tablet Take 440 mg by mouth 2 (two) times daily as needed (pain).     ondansetron (ZOFRAN ODT) 4 MG disintegrating tablet Take 1 tablet (4 mg total) by mouth every 8 (eight) hours as needed for nausea or vomiting. 20 tablet 0   oxyCODONE (OXY IR/ROXICODONE) 5 MG immediate release tablet Take 1 tablet (5 mg total) by mouth every 6 (six) hours as  needed for severe pain. 5 tablet 0   Polyethyl Glycol-Propyl Glycol 0.4-0.3 % SOLN Place 1 drop into both eyes at bedtime.     Probiotic Product (VSL#3 PO) Take 1 capsule by mouth daily.     simvastatin (ZOCOR) 10 MG tablet Take 10 mg by mouth daily.     Tea Tree Oil (NO FUNGUS NAIL PROTECTANT EX) Apply 1 drop topically 3 (three) times a week.     colestipol (COLESTID) 1 g tablet Take by mouth.     No current facility-administered medications on file prior to visit.    ALLERGIES: Chocolate, Erythromycin, Iron, Pneumococcal vaccines, Prednisone, Shrimp [shellfish allergy], and Sulfa antibiotics  SH:  married, non smoker  Review of Systems  Constitutional: Negative.   Genitourinary:        Pelvic pain    PHYSICAL EXAMINATION:    BP 118/70 (BP Location: Left Arm, Patient Position: Sitting, Cuff Size: Large)   Pulse 74   Ht 5' 2.5" (1.588 m) Comment: Reported  Wt 146 lb 3.2 oz (66.3 kg)   LMP 01/08/2009   BMI 26.31 kg/m     General appearance: alert, cooperative and appears stated age Abdomen: soft, + suprapubic tenderness; bowel sounds normal; no masses,  no organomegaly Lymph:  no inguinal LAD noted  Pelvic: External genitalia:  no lesions              Urethra:  normal appearing urethra with no masses, tenderness or lesions              Bartholins and Skenes: normal                 Vagina: normal appearing vagina with normal color and discharge, no lesions              Cervix: no lesions              Bimanual Exam:  Uterus:  normal size, contour, position, consistency, mobility, non-tender              Adnexa: no mass, fullness, tenderness              Chaperone, Catherine [name], CMA, was present for exam.  Assessment/Plan: 1. Pelvic pain - POCT Urinalysis Dipstick - Urine Culture - nitrofurantoin, macrocrystal-monohydrate, (MACROBID) 100 MG capsule; Take 1 capsule (100 mg total) by mouth 2 (two) times daily.  Dispense: 10 capsule; Refill: 0  2. Chronic vulvitis -  estradiol (ESTRACE) 0.1 MG/GM vaginal cream; USE PEA SIZED AMOUNT OF CREAM EXTERNALLY EVERY MON, WED, AND SAT  Dispense: 42.5 g; Refill: 2 - clobetasol ointment (TEMOVATE) 0.05 %;  Apply topically 2 (two) times daily. Use only when has symptoms and do not use for more than 5 days.  Dispense: 30 g; Refill: 1

## 2021-11-04 LAB — URINE CULTURE

## 2021-11-06 ENCOUNTER — Encounter (HOSPITAL_BASED_OUTPATIENT_CLINIC_OR_DEPARTMENT_OTHER): Payer: Self-pay | Admitting: Obstetrics & Gynecology

## 2021-11-14 DIAGNOSIS — R8290 Unspecified abnormal findings in urine: Secondary | ICD-10-CM | POA: Diagnosis not present

## 2021-11-14 DIAGNOSIS — N3289 Other specified disorders of bladder: Secondary | ICD-10-CM | POA: Diagnosis not present

## 2021-11-16 DIAGNOSIS — Z1231 Encounter for screening mammogram for malignant neoplasm of breast: Secondary | ICD-10-CM | POA: Diagnosis not present

## 2021-11-20 ENCOUNTER — Encounter (HOSPITAL_BASED_OUTPATIENT_CLINIC_OR_DEPARTMENT_OTHER): Payer: Self-pay | Admitting: *Deleted

## 2021-12-05 ENCOUNTER — Ambulatory Visit: Payer: Medicare PPO | Admitting: Podiatry

## 2021-12-06 DIAGNOSIS — R102 Pelvic and perineal pain: Secondary | ICD-10-CM | POA: Diagnosis not present

## 2021-12-06 DIAGNOSIS — N952 Postmenopausal atrophic vaginitis: Secondary | ICD-10-CM | POA: Diagnosis not present

## 2022-01-04 DIAGNOSIS — K9089 Other intestinal malabsorption: Secondary | ICD-10-CM | POA: Diagnosis not present

## 2022-01-04 DIAGNOSIS — K219 Gastro-esophageal reflux disease without esophagitis: Secondary | ICD-10-CM | POA: Diagnosis not present

## 2022-01-04 DIAGNOSIS — K5730 Diverticulosis of large intestine without perforation or abscess without bleeding: Secondary | ICD-10-CM | POA: Diagnosis not present

## 2022-01-04 DIAGNOSIS — Z86010 Personal history of colonic polyps: Secondary | ICD-10-CM | POA: Diagnosis not present

## 2022-01-17 DIAGNOSIS — R8271 Bacteriuria: Secondary | ICD-10-CM | POA: Diagnosis not present

## 2022-01-17 DIAGNOSIS — R102 Pelvic and perineal pain: Secondary | ICD-10-CM | POA: Diagnosis not present

## 2022-01-17 DIAGNOSIS — N952 Postmenopausal atrophic vaginitis: Secondary | ICD-10-CM | POA: Diagnosis not present

## 2022-01-26 DIAGNOSIS — E559 Vitamin D deficiency, unspecified: Secondary | ICD-10-CM | POA: Diagnosis not present

## 2022-01-26 DIAGNOSIS — E039 Hypothyroidism, unspecified: Secondary | ICD-10-CM | POA: Diagnosis not present

## 2022-01-26 DIAGNOSIS — Z79899 Other long term (current) drug therapy: Secondary | ICD-10-CM | POA: Diagnosis not present

## 2022-01-26 DIAGNOSIS — E7800 Pure hypercholesterolemia, unspecified: Secondary | ICD-10-CM | POA: Diagnosis not present

## 2022-01-26 DIAGNOSIS — E1169 Type 2 diabetes mellitus with other specified complication: Secondary | ICD-10-CM | POA: Diagnosis not present

## 2022-01-26 DIAGNOSIS — D509 Iron deficiency anemia, unspecified: Secondary | ICD-10-CM | POA: Diagnosis not present

## 2022-01-30 DIAGNOSIS — E039 Hypothyroidism, unspecified: Secondary | ICD-10-CM | POA: Diagnosis not present

## 2022-01-30 DIAGNOSIS — J4541 Moderate persistent asthma with (acute) exacerbation: Secondary | ICD-10-CM | POA: Diagnosis not present

## 2022-01-30 DIAGNOSIS — F330 Major depressive disorder, recurrent, mild: Secondary | ICD-10-CM | POA: Diagnosis not present

## 2022-01-30 DIAGNOSIS — E1169 Type 2 diabetes mellitus with other specified complication: Secondary | ICD-10-CM | POA: Diagnosis not present

## 2022-01-30 DIAGNOSIS — Z0000 Encounter for general adult medical examination without abnormal findings: Secondary | ICD-10-CM | POA: Diagnosis not present

## 2022-01-30 DIAGNOSIS — E559 Vitamin D deficiency, unspecified: Secondary | ICD-10-CM | POA: Diagnosis not present

## 2022-01-30 DIAGNOSIS — I700 Atherosclerosis of aorta: Secondary | ICD-10-CM | POA: Diagnosis not present

## 2022-01-30 DIAGNOSIS — E7800 Pure hypercholesterolemia, unspecified: Secondary | ICD-10-CM | POA: Diagnosis not present

## 2022-01-30 DIAGNOSIS — L989 Disorder of the skin and subcutaneous tissue, unspecified: Secondary | ICD-10-CM | POA: Diagnosis not present

## 2022-03-22 DIAGNOSIS — K5730 Diverticulosis of large intestine without perforation or abscess without bleeding: Secondary | ICD-10-CM | POA: Diagnosis not present

## 2022-03-22 DIAGNOSIS — K9089 Other intestinal malabsorption: Secondary | ICD-10-CM | POA: Diagnosis not present

## 2022-03-22 DIAGNOSIS — Z86010 Personal history of colonic polyps: Secondary | ICD-10-CM | POA: Diagnosis not present

## 2022-03-22 DIAGNOSIS — K219 Gastro-esophageal reflux disease without esophagitis: Secondary | ICD-10-CM | POA: Diagnosis not present

## 2022-04-01 ENCOUNTER — Emergency Department (HOSPITAL_COMMUNITY): Payer: Medicare PPO

## 2022-04-01 ENCOUNTER — Other Ambulatory Visit: Payer: Self-pay

## 2022-04-01 ENCOUNTER — Emergency Department (HOSPITAL_COMMUNITY)
Admission: EM | Admit: 2022-04-01 | Discharge: 2022-04-01 | Disposition: A | Discharge status: Home / Self Care | Payer: Medicare PPO | Attending: Emergency Medicine | Admitting: Emergency Medicine

## 2022-04-01 DIAGNOSIS — I4891 Unspecified atrial fibrillation: Secondary | ICD-10-CM | POA: Diagnosis not present

## 2022-04-01 DIAGNOSIS — Z1152 Encounter for screening for COVID-19: Secondary | ICD-10-CM | POA: Diagnosis not present

## 2022-04-01 DIAGNOSIS — R002 Palpitations: Secondary | ICD-10-CM | POA: Diagnosis not present

## 2022-04-01 DIAGNOSIS — J209 Acute bronchitis, unspecified: Secondary | ICD-10-CM | POA: Diagnosis not present

## 2022-04-01 DIAGNOSIS — Z0389 Encounter for observation for other suspected diseases and conditions ruled out: Secondary | ICD-10-CM | POA: Diagnosis not present

## 2022-04-01 DIAGNOSIS — J029 Acute pharyngitis, unspecified: Secondary | ICD-10-CM | POA: Insufficient documentation

## 2022-04-01 LAB — CBC WITH DIFFERENTIAL/PLATELET
Abs Immature Granulocytes: 0.02 10*3/uL (ref 0.00–0.07)
Basophils Absolute: 0 10*3/uL (ref 0.0–0.1)
Basophils Relative: 1 %
Eosinophils Absolute: 0.2 10*3/uL (ref 0.0–0.5)
Eosinophils Relative: 2 %
HCT: 40.3 % (ref 36.0–46.0)
Hemoglobin: 12.9 g/dL (ref 12.0–15.0)
Immature Granulocytes: 0 %
Lymphocytes Relative: 23 %
Lymphs Abs: 2 10*3/uL (ref 0.7–4.0)
MCH: 27.3 pg (ref 26.0–34.0)
MCHC: 32 g/dL (ref 30.0–36.0)
MCV: 85.2 fL (ref 80.0–100.0)
Monocytes Absolute: 0.4 10*3/uL (ref 0.1–1.0)
Monocytes Relative: 4 %
Neutro Abs: 6.2 10*3/uL (ref 1.7–7.7)
Neutrophils Relative %: 70 %
Platelets: 248 10*3/uL (ref 150–400)
RBC: 4.73 MIL/uL (ref 3.87–5.11)
RDW: 14.8 % (ref 11.5–15.5)
WBC: 8.8 10*3/uL (ref 4.0–10.5)
nRBC: 0 % (ref 0.0–0.2)

## 2022-04-01 LAB — COMPREHENSIVE METABOLIC PANEL
ALT: 17 U/L (ref 0–44)
AST: 19 U/L (ref 15–41)
Albumin: 3.5 g/dL (ref 3.5–5.0)
Alkaline Phosphatase: 115 U/L (ref 38–126)
Anion gap: 10 (ref 5–15)
BUN: 9 mg/dL (ref 8–23)
CO2: 25 mmol/L (ref 22–32)
Calcium: 9.3 mg/dL (ref 8.9–10.3)
Chloride: 103 mmol/L (ref 98–111)
Creatinine, Ser: 0.82 mg/dL (ref 0.44–1.00)
GFR, Estimated: >60 mL/min (ref >60)
Glucose, Bld: 143 mg/dL — ABNORMAL HIGH (ref 70–99)
Potassium: 3.5 mmol/L (ref 3.5–5.1)
Sodium: 138 mmol/L (ref 135–145)
Total Bilirubin: 0.6 mg/dL (ref 0.3–1.2)
Total Protein: 7.2 g/dL (ref 6.5–8.1)

## 2022-04-01 LAB — RESP PANEL BY RT-PCR (RSV, FLU A&B, COVID)  RVPGX2
Influenza A by PCR: NEGATIVE
Influenza B by PCR: NEGATIVE
Resp Syncytial Virus by PCR: NEGATIVE
SARS Coronavirus 2 by RT PCR: NEGATIVE

## 2022-04-01 LAB — T4, FREE: Free T4: 1 ng/dL (ref 0.61–1.12)

## 2022-04-01 LAB — TSH: TSH: 2.337 u[IU]/mL (ref 0.350–4.500)

## 2022-04-01 LAB — MAGNESIUM: Magnesium: 2 mg/dL (ref 1.7–2.4)

## 2022-04-01 MED ORDER — DEXAMETHASONE 4 MG PO TABS
10.0000 mg | ORAL_TABLET | Freq: Once | ORAL | Status: AC
  Administered 2022-04-01: 10 mg via ORAL
  Filled 2022-04-01: qty 3

## 2022-04-01 MED ORDER — SODIUM CHLORIDE 0.9 % IV BOLUS
1000.0000 mL | Freq: Once | INTRAVENOUS | Status: AC
  Administered 2022-04-01: 1000 mL via INTRAVENOUS

## 2022-04-01 NOTE — Discharge Instructions (Signed)
Please follow-up with your family doctor and cardiologist.  I have placed a referral in for the cardiology office to give you a call, there is also the number for the A-fib clinic that you can call on Monday to set up a urgent appointment.  If you have recurrent and persistent symptoms then you can return to the emergency department for repeat evaluation.

## 2022-04-01 NOTE — ED Provider Notes (Signed)
[facility] Provider Note   CSN: [id number] Arrival date & time: 04/01/22  1200     History  Chief Complaint  Patient presents with   Palpitations   Sore Cold [place] Gartin is a 72 y.o. female.  72 yo F with a chief complaints of feeling her heart is racing.  This seems to come and go.  She has had 3 episodes of this 1 was sometime ago 1 was a couple weeks ago and then 1 was this morning.  She says that usually gets better on its own.  This morning seemed a bit more persistent and she went to her doctor's office for evaluation.  She has been sick this week cough congestion feels like her asthma has gotten worse sore throat no fevers.  Has been using her inhaler every 6 hours while awake.  She had medication added to her regiment for diarrhea status postcholecystectomy this was changed about a year ago.  She denies any more recent medication changes.   Palpitations Sore Throat       Home Medications Prior to Admission medications   Medication Sig Start Date End Date Taking? Authorizing Provider  ACCU-CHEK GUIDE test strip  08/21/20   [provider]  albuterol (PROAIR HFA) 108 (90 Base) MCG/ACT inhaler Inhale 1-2 puffs into the lungs every 6 (six) hours as needed. Patient taking differently: Inhale 1-2 puffs into the lungs every 6 (six) hours as needed for shortness of breath or wheezing. 11/25/18   Megan Salon, MD  betamethasone dipropionate 0.05 % cream Apply 1 application topically daily as needed for rash. 06/16/20   [provider]  bismuth subsalicylate (PEPTO BISMOL) 262 MG/15ML suspension Take 30 mLs by mouth every 6 (six) hours as needed for indigestion.    [provider]  cholecalciferol (VITAMIN D3) 25 MCG (1000 UNIT) tablet Take 2,000 Units by mouth daily.    [provider]  cholestyramine (QUESTRAN) 4 g packet Take by mouth. 12/03/20   [provider]  clobetasol  ointment (TEMOVATE) 0.05 % Apply topically 2 (two) times daily. Use only when has symptoms and do not use for more than 5 days. 11/02/21   Megan Salon, MD  colestipol (COLESTID) 1 g tablet Take by mouth. 09/06/20 09/06/21  [provider]  desoximetasone (TOPICORT) 0.25 % cream Apply 1 application topically daily as needed (Arm rash). 07/27/15   [provider]  dicyclomine (BENTYL) 20 MG tablet Take 1 tablet (20 mg total) by mouth 2 (two) times daily. Patient taking differently: Take 20 mg by mouth 2 (two) times daily as needed for spasms. 06/21/20   Horton, Barbette Hair, MD  esomeprazole (NEXIUM) 40 MG capsule Take 1 capsule (40 mg total) by mouth 2 (two) times daily. Patient taking differently: Take 40 mg by mouth daily. 09/17/12   Lafayette Dragon, MD  estradiol (ESTRACE) 0.1 MG/GM vaginal cream USE PEA SIZED AMOUNT OF CREAM EXTERNALLY EVERY MON, WED, AND SAT 11/02/21   Megan Salon, MD  fexofenadine (ALLEGRA) 180 MG tablet Take 180 mg by mouth at bedtime as needed for allergies.    [provider]  fluticasone (FLONASE) 50 MCG/ACT nasal spray Place 1 spray into both nostrils daily as needed for allergies. 09/19/17   [provider]  levothyroxine (SYNTHROID, LEVOTHROID) 25 MCG tablet Take 25-50 mcg by mouth See admin instructions. Take 50 mcg daily on Mon, Tues, Wed, Thurs, and Fri. Take 25  mcg daily on Sat and Sun    [provider]  loratadine (CLARITIN) 10 MG tablet Take 10 mg by mouth daily as needed for allergies.    [provider]  metFORMIN (GLUCOPHAGE-XR) 500 MG 24 hr tablet Take 500 mg by mouth daily.    [provider]  naproxen sodium (ALEVE) 220 MG tablet Take 440 mg by mouth 2 (two) times daily as needed (pain).    [provider]  nitrofurantoin, macrocrystal-monohydrate, (MACROBID) 100 MG capsule Take 1 capsule (100 mg total) by mouth 2 (two) times daily. 11/02/21   Megan Salon, MD  ondansetron (ZOFRAN ODT) 4 MG  disintegrating tablet Take 1 tablet (4 mg total) by mouth every 8 (eight) hours as needed for nausea or vomiting. 06/21/20   Horton, Barbette Hair, MD  Polyethyl Glycol-Propyl Glycol 0.4-0.3 % SOLN Place 1 drop into both eyes at bedtime.    [provider]  Probiotic Product (VSL#3 PO) Take 1 capsule by mouth daily.    [provider]  simvastatin (ZOCOR) 10 MG tablet Take 10 mg by mouth daily.    [provider]  Tea Tree Oil (NO FUNGUS NAIL PROTECTANT EX) Apply 1 drop topically 3 (three) times a week.    [provider]      Allergies    Chocolate, Erythromycin, Iron, Pneumococcal vaccines, Prednisone, Shrimp [shellfish allergy], and Sulfa antibiotics    Review of Systems   Review of Systems  Cardiovascular:  Positive for palpitations.    Physical Exam Updated Vital Signs BP 127/74   Pulse 90   Temp 98.7 F (37.1 C) (Oral)   Resp (!) 24   Ht 5\' 3"  (1.6 m)   Wt 65.8 kg   LMP 01/08/2009   SpO2 100%   BMI 25.69 kg/m  Physical Exam Vitals and nursing note reviewed.  Constitutional:      General: She is not in acute distress.    Appearance: She is well-developed. She is not diaphoretic.  HENT:     Head: Normocephalic and atraumatic.  Eyes:     Pupils: Pupils are equal, round, and reactive to light.  Cardiovascular:     Rate and Rhythm: Normal rate and regular rhythm.     Heart sounds: No murmur heard.    No friction rub. No gallop.  Pulmonary:     Effort: Pulmonary effort is normal.     Breath sounds: No wheezing or rales.  Abdominal:     General: There is no distension.     Palpations: Abdomen is soft.     Tenderness: There is no abdominal tenderness.  Musculoskeletal:        General: No tenderness.     Cervical back: Normal range of motion and neck supple.  Skin:    General: Skin is warm and dry.  Neurological:     Mental Status: She is alert and oriented to person, place, and time.  Psychiatric:        Behavior: Behavior normal.      ED Results / Procedures / Treatments   Labs (all labs ordered are listed, but only abnormal results are displayed) Labs Reviewed  COMPREHENSIVE METABOLIC PANEL - Abnormal; Notable for the following components:      Result Value   Glucose, Bld 143 (*)    All other components within normal limits  RESP PANEL BY RT-PCR (RSV, FLU A&B, COVID)  RVPGX2  CBC WITH DIFFERENTIAL/PLATELET  MAGNESIUM  TSH  T4, FREE    EKG  EKG Interpretation  Date/Time:  Sunday April 01 2022 12:16:59 EDT Ventricular Rate:  106 PR Interval:  139 QRS Duration: 80 QT Interval:  306 QTC Calculation: 407 R Axis:   8 Text Interpretation: Sinus tachycardia Ventricular premature complex Aberrant complex Borderline low voltage, extremity leads Since last tracing rate faster Otherwise no significant change Confirmed by Deno Etienne [phone]) on 04/01/2022 12:34:44 PM  Radiology DG Chest Port 1 View  Result Date: 04/01/2022 CLINICAL DATA:  Afib? EXAM: PORTABLE CHEST - 1 VIEW COMPARISON:  12/24/2019. FINDINGS: Cardiac silhouette is unremarkable. No pneumothorax or pleural effusion. The lungs are clear. Aorta is calcified. The visualized skeletal structures are unremarkable. IMPRESSION: No acute cardiopulmonary process. Electronically Signed   By: Sammie Bench M.D.   On: 04/01/2022 13:20    Procedures .1-3 Lead EKG Interpretation  Performed by: Deno Etienne, DO Authorized by: Deno Etienne, DO     Interpretation: normal     ECG rate:  86   ECG rate assessment: normal     Rhythm: sinus rhythm     Ectopy: none     Conduction: normal       Medications Ordered in ED Medications  dexamethasone (DECADRON) tablet 10 mg (has no administration in time range)  sodium chloride 0.9 % bolus 1,000 mL (1,000 mLs Intravenous New Bag/Given 04/01/22 1241)    ED Course/ Medical Decision Making/ A&P                             Medical Decision Making Amount and/or Complexity of Data Reviewed Labs: ordered. Radiology:  ordered.  Risk Prescription drug management.   72 yo F with a cc of palpitations.  Started this morning. Has had two prior episodes that were self resolved.  She does feel better now.  I reviewed the note from the patient's [facility] walk-in clinic, she does have an EKG that is concerning for either atrial fibrillation or atrial flutter with a 2-1 block.  She currently is in sinus tachycardia.  Will obtain a laboratory evaluation.  I calculated her CHA2DS2-VASc would be 3(though she has a hx of DM not requiring medicine).  Will discuss with her about possible anticoagulation.   Patient is reluctant to start anticoagulation at this time.  Will give a dose of Decadron for her exacerbation of her asthma.  Chest x-ray independently interpreted by me without focal infiltrate or pneumothorax.  No anemia, no significant electrolyte abnormality.  Magnesium levels normal.  No recurrence of A-fib while in the ED.  2:08 PM:  I have discussed the diagnosis/risks/treatment options with the patient and family.  Evaluation and diagnostic testing in the emergency department does not suggest an emergent condition requiring admission or immediate intervention beyond what has been performed at this time.  They will follow up with PCP, cards. We also discussed returning to the ED immediately if new or worsening sx occur. We discussed the sx which are most concerning (e.g., sudden worsening pain, fever, inability to tolerate by mouth) that necessitate immediate return. Medications administered to the patient during their visit and any new prescriptions provided to the patient are listed below.  Medications given during this visit Medications  dexamethasone (DECADRON) tablet 10 mg (has no administration in time range)  sodium chloride 0.9 % bolus 1,000 mL (1,000 mLs Intravenous New Bag/Given 04/01/22 1241)     The patient appears reasonably screen and/or stabilized for discharge and I doubt any other medical condition or  other  EMC requiring further screening, evaluation, or treatment in the ED at this time prior to discharge.    CHA2DS2/VAS Stroke Risk Points      N/A >= 2 Points: High Risk  1 - 1.99 Points: Medium Risk  0 Points: Low Risk    Last Change: N/A      This score determines the patient's risk of having a stroke if the  patient has atrial fibrillation.      This score is not applicable to this patient. Components are not  calculated.            Final Clinical Impression(s) / ED Diagnoses Final diagnoses:  Atrial fibrillation with RVR ([place])    Rx / DC Orders ED Discharge Orders          Ordered    Ambulatory referral to Cardiology       Comments: If you have not heard from the Cardiology office within the next 72 hours please call [phone].   04/01/22 [place], [place], DO 04/01/22 1408

## 2022-04-01 NOTE — ED Triage Notes (Signed)
Pt arrived via POV, from PCP D/T complaints of heart palpitations was in Afib with RVR at PCP office. Upon arrival PT also complains of sore throat and Hx of asthma, stating using inhaler multiple times a day recently. Recent cholecystectomy. Endorses light-headedness, denies n/v and pain.

## 2022-04-03 DIAGNOSIS — J029 Acute pharyngitis, unspecified: Secondary | ICD-10-CM | POA: Diagnosis not present

## 2022-04-03 DIAGNOSIS — J4521 Mild intermittent asthma with (acute) exacerbation: Secondary | ICD-10-CM | POA: Diagnosis not present

## 2022-04-03 DIAGNOSIS — I4891 Unspecified atrial fibrillation: Secondary | ICD-10-CM | POA: Diagnosis not present

## 2022-04-03 DIAGNOSIS — R9431 Abnormal electrocardiogram [ECG] [EKG]: Secondary | ICD-10-CM | POA: Diagnosis not present

## 2022-04-05 ENCOUNTER — Ambulatory Visit: Payer: Medicare PPO | Admitting: Cardiology

## 2022-04-05 ENCOUNTER — Encounter: Payer: Self-pay | Admitting: Cardiology

## 2022-04-05 VITALS — BP 105/71 | HR 87 | Ht 63.0 in | Wt 141.0 lb

## 2022-04-05 DIAGNOSIS — R9431 Abnormal electrocardiogram [ECG] [EKG]: Secondary | ICD-10-CM

## 2022-04-05 DIAGNOSIS — E119 Type 2 diabetes mellitus without complications: Secondary | ICD-10-CM

## 2022-04-05 DIAGNOSIS — I484 Atypical atrial flutter: Secondary | ICD-10-CM | POA: Diagnosis not present

## 2022-04-05 DIAGNOSIS — I2584 Coronary atherosclerosis due to calcified coronary lesion: Secondary | ICD-10-CM | POA: Diagnosis not present

## 2022-04-05 DIAGNOSIS — E782 Mixed hyperlipidemia: Secondary | ICD-10-CM

## 2022-04-05 DIAGNOSIS — I2510 Atherosclerotic heart disease of native coronary artery without angina pectoris: Secondary | ICD-10-CM

## 2022-04-05 MED ORDER — SIMVASTATIN 20 MG PO TABS: 40.0000 mg | ORAL_TABLET | Freq: Every day | ORAL | 3 refills | Status: DC

## 2022-04-05 MED ORDER — ASPIRIN 81 MG PO TBEC: 81.0000 mg | DELAYED_RELEASE_TABLET | Freq: Every day | ORAL | 3 refills | Status: DC

## 2022-04-05 NOTE — Progress Notes (Signed)
Primary Physician/Referring:  Jonathon Jordan, MD  Patient ID: Catherine [name], female    DOB: [birth year], 72 y.o.   MRN: [id number]  Chief Complaint  Patient presents with   Atrial Fibrillation   New Patient (Initial Visit)   HPI:    Catherine [name]  is a 72 y.o. female with a significant PMH for but not limited to DM, GERD, hyperlipidemia, hypothyroidism,asthma, and cholelithiasis, who presents today to establish care (04/05/2022), and cardiac evaluation for concerns for atrial fibrillation.   Pt was recently seen in the ED for palpitations in which the EKG showed sinus tachycardia, however, previous notes form note from the patient's [facility] walk-in clinic, states she does have an EKG that is concerning for either atrial fibrillation or atrial flutter with a 2-1 block. She was reluctant to start anticoagulation despite CHA2DS2-VASc Score of (3).   Today she denies chest pain, shortness of breath, palpitations, leg edema, orthopnea, PND, TIA/syncope. She was was recently using an inhaler for allergies. She felt that her allergies were bother her, used her inhaler and then she felt lightheadedness and palpitations. This happened about two weeks ago , but after drinking water and walking around. She states when these episodes start, she feels her heart racing. She does admit to this happening to when she is emotional but thinks this feeling is different. Last event was Sunday 04/01/2022. She does also endorse lightheadedness when standing up. She states exercises with stretches daily.   Past Medical History:  Diagnosis Date   Anal fissure    Anxiety    Arthritis    bilateral knees   Asthma    Barrett esophagus    normal now   Cholelithiasis    Diabetes mellitus    Diverticulosis    Dr Olevia Perches   Gastritis    GERD (gastroesophageal reflux disease)    Hyperlipidemia    Hyperplastic polyps of stomach    Hypothyroidism    IBS (irritable bowel syndrome)    Osteopenia    Recurrent UTI     seeing Dr Dahlsted-recent culture negative   Past Surgical History:  Procedure Laterality Date   CATARACT EXTRACTION Bilateral    R, then L.  Cataracts excision   CHOLECYSTECTOMY N/A 08/23/2020   Procedure: LAPAROSCOPIC CHOLECYSTECTOMY;  Surgeon: Stark Klein, MD;  Location: [facility];  Service: General;  Laterality: N/A;   DILATION AND CURETTAGE OF UTERUS     KNEE ARTHROSCOPY Right 08/2021   partial ligmanent tear   LAPAROSCOPY N/A 05/19/2012   Procedure: LAPAROSCOPY OPERATIVE;  Surgeon: Lyman Speller, MD;  Location: [facility];  Service: Gynecology;  Laterality: N/A;   RADIOACTIVE SEED GUIDED EXCISIONAL BREAST BIOPSY Left 04/12/2020   Procedure: LEFT BREAST SEED LOCALIZED EXCISIONAL BIOPSY;  Surgeon: Stark Klein, MD;  Location: [facility];  Service: General;  Laterality: Left;   SALPINGOOPHORECTOMY Bilateral 05/19/2012   Procedure: SALPINGO OOPHORECTOMY;  Surgeon: Lyman Speller, MD;  Location: [facility];  Service: Gynecology;  Laterality: Bilateral;  including left dermoid cyst   TUBAL LIGATION     with D&C due to Incomplete AB   Family History  Problem Relation Age of Onset   Diabetes Mother 86       on insulin   Hypertension Father    Stroke Father    Diabetes Father 12       on po meds   Diabetes Sister        maternal aunt   Lymphoma Brother    Heart disease Brother  heart stent   Thyroid disease Son    Colon cancer Neg Hx     Social History   Tobacco Use   Smoking status: Never   Smokeless tobacco: Never  Substance Use Topics   Alcohol use: Never   Marital Status: Married  ROS  Review of Systems  Constitutional: Negative.  Cardiovascular: Negative.   Respiratory:  Positive for cough.   Musculoskeletal: Negative.   Psychiatric/Behavioral: Negative.     Objective      04/05/2022    9:09 AM 04/01/2022    1:00 PM 04/01/2022   12:45 PM  Vitals with BMI  Height 5\' 3"     Weight 141 lbs    BMI [id number]    Systolic [id number]  Diastolic 71 74 75  Pulse  87 90 86   SpO2: 98 %   Physical Exam Constitutional:      Appearance: Normal appearance.  Cardiovascular:     Rate and Rhythm: Normal rate.     Pulses: Normal pulses.     Heart sounds: Normal heart sounds. No murmur heard.    No gallop.  Pulmonary:     Effort: Pulmonary effort is normal.     Breath sounds: Normal breath sounds.  Abdominal:     General: Abdomen is flat.     Palpations: Abdomen is soft.  Musculoskeletal:     Right lower leg: No edema.     Left lower leg: No edema.  Skin:    General: Skin is warm and dry.  Neurological:     Mental Status: She is alert.  Psychiatric:        Mood and Affect: Mood normal.     Laboratory examination:   Recent Labs    04/01/22 1200  NA 138  K 3.5  CL 103  CO2 25  GLUCOSE 143*  BUN 9  CREATININE 0.82  CALCIUM 9.3  GFRNONAA >60    Lab Results  Component Value Date   GLUCOSE 143 (H) 04/01/2022   NA 138 04/01/2022   K 3.5 04/01/2022   CL 103 04/01/2022   CO2 25 04/01/2022   BUN 9 04/01/2022   CREATININE 0.82 04/01/2022   GFRNONAA >60 04/01/2022   CALCIUM 9.3 04/01/2022   PROT 7.2 04/01/2022   ALBUMIN 3.5 04/01/2022   BILITOT 0.6 04/01/2022   ALKPHOS 115 04/01/2022   AST 19 04/01/2022   ALT 17 04/01/2022   ANIONGAP 10 04/01/2022      Lab Results  Component Value Date   ALT 17 04/01/2022   AST 19 04/01/2022   ALKPHOS 115 04/01/2022   BILITOT 0.6 04/01/2022       Latest Ref Rng & Units 04/01/2022   12:00 PM 08/17/2020    4:25 PM 06/20/2020    8:11 PM  Hepatic Function  Total Protein 6.5 - 8.1 g/dL 7.2  6.8  7.3   Albumin 3.5 - 5.0 g/dL 3.5  3.8  4.2   AST 15 - 41 U/L 19  15  11    ALT 0 - 44 U/L 17  16  12    Alk Phosphatase 38 - 126 U/L 115  80  97   Total Bilirubin 0.3 - 1.2 mg/dL 0.6  0.5  0.4     Lipid Panel No results for input(s): "CHOL", "TRIG", "LDLCALC", "VLDL", "HDL", "CHOLHDL", "LDLDIRECT" in the last 8760 hours.  HEMOGLOBIN A1C Lab Results  Component Value Date   HGBA1C 6.3 (H)  08/17/2020   MPG 134.11 08/17/2020   TSH  Recent Labs    04/01/22 1200  TSH 2.337    External labs:   01/30/2022: A1c 6.5%. Vitamin D40.8. Total cholesterol 208, triglycerides 68, HDL 72, LDL 124.  Non-HDL cholesterol 136.  Radiology:   CT of the abdomen and pelvis 06/20/2020: Lower chest: Atelectatic changes in the otherwise clear lung bases. Normal heart size. No pericardial effusion. Coronary artery calcifications. Vascular/Lymphatic: Atherosclerotic calcifications within the abdominal aorta and branch vessels. No aneurysm or ectasia Gallbladder contains multiple calcified gallstones.  Cardiac Studies:   No results found for this or any previous visit from the past 1095 days.     No results found for this or any previous visit from the past 1095 days.   EKG:   EKG 04/05/2022: Normal sinus rhythm at the rate of 83 bpm, LAE, otherwise normal EKG.    Medications and allergies   Allergies  Allergen Reactions   Chocolate Hives and Itching   Chocolate Flavor Other (See Comments)   Erythromycin Diarrhea   Erythromycin Base     Other Reaction(s): Diarrhea   Iron Itching    Other Reaction(s): itching & rashes   Pneumococcal 13-Val Conj Vacc     Other Reaction(s): Rash, sore arm, fever   Pneumococcal Vaccines     Fever    Prednisone     Stomach burning    Shrimp Flavor Other (See Comments)   Shrimp [Shellfish Allergy]     hives   Sulfa Antibiotics Diarrhea   Tetanus-Diphtheria Toxoids Td     Other Reaction(s): Fever, sore arm     Medication list   Current Outpatient Medications:    ACCU-CHEK GUIDE test strip, , Disp: , Rfl:    albuterol (PROAIR HFA) 108 (90 Base) MCG/ACT inhaler, Inhale 1-2 puffs into the lungs every 6 (six) hours as needed. (Patient taking differently: Inhale 1-2 puffs into the lungs every 6 (six) hours as needed for shortness of breath or wheezing.), Disp: 8 g, Rfl: 2   aspirin EC 81 MG tablet, Take 1 tablet (81 mg total) by mouth daily.,  Disp: 90 tablet, Rfl: 3   betamethasone dipropionate 0.05 % cream, Apply 1 application topically daily as needed for rash., Disp: , Rfl:    bismuth subsalicylate (PEPTO BISMOL) 262 MG/15ML suspension, Take 30 mLs by mouth every 6 (six) hours as needed for indigestion., Disp: , Rfl:    clobetasol ointment (TEMOVATE) 0.05 %, Apply topically 2 (two) times daily. Use only when has symptoms and do not use for more than 5 days., Disp: 30 g, Rfl: 1   colestipol (COLESTID) 1 g tablet, Take 1 g by mouth 2 (two) times daily., Disp: , Rfl:    desoximetasone (TOPICORT) 0.25 % cream, Apply 1 application topically daily as needed (Arm rash)., Disp: , Rfl:    dicyclomine (BENTYL) 20 MG tablet, Take 1 tablet (20 mg total) by mouth 2 (two) times daily. (Patient taking differently: Take 20 mg by mouth 2 (two) times daily as needed for spasms.), Disp: 20 tablet, Rfl: 0   esomeprazole (NEXIUM) 40 MG capsule, Take 1 capsule (40 mg total) by mouth 2 (two) times daily. (Patient taking differently: Take 40 mg by mouth daily.), Disp: 60 capsule, Rfl: 10   estradiol (ESTRACE) 0.1 MG/GM vaginal cream, USE PEA SIZED AMOUNT OF CREAM EXTERNALLY EVERY MON, WED, AND SAT, Disp: 42.5 g, Rfl: 2   fexofenadine (ALLEGRA) 180 MG tablet, Take 180 mg by mouth at bedtime as needed for allergies., Disp: , Rfl:    fluticasone (FLONASE)  50 MCG/ACT nasal spray, Place 1 spray into both nostrils daily as needed for allergies., Disp: , Rfl: 11   levothyroxine (SYNTHROID, LEVOTHROID) 25 MCG tablet, Take 25-50 mcg by mouth See admin instructions. Take 50 mcg daily on Mon, Tues, Wed, Thurs, and Fri. Take 25 mcg daily on Sat and Sun, Disp: , Rfl:    loratadine (CLARITIN) 10 MG tablet, Take 10 mg by mouth daily as needed for allergies., Disp: , Rfl:    naproxen sodium (ALEVE) 220 MG tablet, Take 440 mg by mouth 2 (two) times daily as needed (pain)., Disp: , Rfl:    ondansetron (ZOFRAN ODT) 4 MG disintegrating tablet, Take 1 tablet (4 mg total) by mouth  every 8 (eight) hours as needed for nausea or vomiting., Disp: 20 tablet, Rfl: 0   Polyethyl Glycol-Propyl Glycol 0.4-0.3 % SOLN, Place 1 drop into both eyes at bedtime., Disp: , Rfl:    Probiotic Product (VSL#3 PO), Take 1 capsule by mouth daily., Disp: , Rfl:    simvastatin (ZOCOR) 20 MG tablet, Take 2 tablets (40 mg total) by mouth at bedtime., Disp: 180 tablet, Rfl: 3  Assessment     ICD-10-CM   1. Atypical atrial flutter (HCC)  I48.4     2. Coronary atherosclerosis due to calcified coronary lesion  I25.10 PCV ECHOCARDIOGRAM COMPLETE   I25.84 PCV MYOCARDIAL PERFUSION WO LEXISCAN    3. Abnormal EKG  R94.31 EKG 12-Lead    4. Mixed hyperlipidemia  E78.2     5. Diet-controlled diabetes mellitus ([place])  E11.9        Orders Placed This Encounter  Procedures   PCV MYOCARDIAL PERFUSION WO LEXISCAN    Standing Status:   Future    Standing Expiration Date:   06/05/2022   EKG 12-Lead   PCV ECHOCARDIOGRAM COMPLETE    Standing Status:   Future    Standing Expiration Date:   04/05/2023    Meds ordered this encounter  Medications   aspirin EC 81 MG tablet    Sig: Take 1 tablet (81 mg total) by mouth daily.    Dispense:  90 tablet    Refill:  3   simvastatin (ZOCOR) 20 MG tablet    Sig: Take 2 tablets (40 mg total) by mouth at bedtime.    Dispense:  180 tablet    Refill:  3    Medications Discontinued During This Encounter  Medication Reason   cholecalciferol (VITAMIN D3) 25 MCG (1000 UNIT) tablet    cholestyramine (QUESTRAN) 4 g packet    metFORMIN (GLUCOPHAGE-XR) 500 MG 24 hr tablet Completed Course   nitrofurantoin, macrocrystal-monohydrate, (MACROBID) 100 MG capsule Completed Course   Tea Tree Oil (NO FUNGUS NAIL PROTECTANT EX)    simvastatin (ZOCOR) 10 MG tablet      Recommendations:   Catherine [name]  is a 72 y.o. female with a significant PMH for but not limited to DM, GERD, hyperlipidemia, hypothyroidism, and cholelithiasis, who presents today to establish care  (04/05/2022), and cardiac evaluation for concerns for atrial fibrillation.   1. Abnormal EKG/Atypical Atrial Flutter Today she presents in NSR. Personally revived EKG form [facility] in what appears to be atypical atrial flutter. In the ER she self converted back into sinus rhythm.  I believe the atrial tachycardia was precipitated by the use of albuterol. Given that this incidence was brief, and likely medication induced, would not benefit from cardiac monitor at this time. Could reevaluate this if symptoms present.  CHA2DS2-VASc Score of (3), however,  no need for anticoagulant at this time.   2. Coronary atherosclerosis due to calcified coronary lesion CT from 06/2020, resulted in atherosclerotic calcifications within the abdominal aorta and branch vessels. Plan - Schedule Echocardiogram  - Schedule Exercise Nuclear Stress Test  - Start ASA 81 mg   4. Mixed hyperlipidemia Lipids personally reviewed. Plan - Increase Simvastatin to 40 mg   5. Diet-controlled diabetes mellitus ([place]) Patient not currently taking metformin due to diet controlled DM.  Continue dietary changes to promote decrease in HbA1C   Follow up in 8 weeks     Adrian Prows, MD, [facility] 04/05/2022, 10:18 AM Office: [phone]

## 2022-04-12 ENCOUNTER — Ambulatory Visit: Payer: Medicare PPO

## 2022-04-12 DIAGNOSIS — I2510 Atherosclerotic heart disease of native coronary artery without angina pectoris: Secondary | ICD-10-CM | POA: Diagnosis not present

## 2022-04-12 DIAGNOSIS — I2584 Coronary atherosclerosis due to calcified coronary lesion: Secondary | ICD-10-CM | POA: Diagnosis not present

## 2022-04-12 NOTE — Progress Notes (Signed)
Normal stress test

## 2022-04-13 NOTE — Progress Notes (Signed)
Gave patient results, she acknowledged understanding and had no further questions.

## 2022-04-16 ENCOUNTER — Ambulatory Visit (HOSPITAL_COMMUNITY): Payer: Medicare PPO | Admitting: Internal Medicine

## 2022-04-17 DIAGNOSIS — K219 Gastro-esophageal reflux disease without esophagitis: Secondary | ICD-10-CM | POA: Diagnosis not present

## 2022-04-17 DIAGNOSIS — R053 Chronic cough: Secondary | ICD-10-CM | POA: Diagnosis not present

## 2022-04-17 DIAGNOSIS — J4520 Mild intermittent asthma, uncomplicated: Secondary | ICD-10-CM | POA: Diagnosis not present

## 2022-04-22 ENCOUNTER — Other Ambulatory Visit: Payer: Self-pay | Admitting: Urology

## 2022-04-22 MED ORDER — DOXYCYCLINE HYCLATE 100 MG PO CAPS: 100.0000 mg | ORAL_CAPSULE | Freq: Two times a day (BID) | ORAL | 0 refills | Status: DC

## 2022-04-24 ENCOUNTER — Telehealth: Payer: Self-pay

## 2022-04-24 ENCOUNTER — Ambulatory Visit: Payer: Medicare PPO

## 2022-04-24 DIAGNOSIS — I2510 Atherosclerotic heart disease of native coronary artery without angina pectoris: Secondary | ICD-10-CM | POA: Diagnosis not present

## 2022-04-24 DIAGNOSIS — I2584 Coronary atherosclerosis due to calcified coronary lesion: Secondary | ICD-10-CM | POA: Diagnosis not present

## 2022-04-24 NOTE — Telephone Encounter (Signed)
Patient wanted to let you know that she could not tolerate Simvastatin  . She has reduced the dosage to .

## 2022-04-27 DIAGNOSIS — N3020 Other chronic cystitis without hematuria: Secondary | ICD-10-CM | POA: Diagnosis not present

## 2022-04-27 DIAGNOSIS — R102 Pelvic and perineal pain: Secondary | ICD-10-CM | POA: Diagnosis not present

## 2022-04-27 DIAGNOSIS — N952 Postmenopausal atrophic vaginitis: Secondary | ICD-10-CM | POA: Diagnosis not present

## 2022-05-16 ENCOUNTER — Ambulatory Visit: Payer: Medicare PPO | Admitting: Cardiovascular Disease

## 2022-05-16 DIAGNOSIS — E611 Iron deficiency: Secondary | ICD-10-CM | POA: Diagnosis not present

## 2022-05-16 DIAGNOSIS — E1165 Type 2 diabetes mellitus with hyperglycemia: Secondary | ICD-10-CM | POA: Diagnosis not present

## 2022-05-16 DIAGNOSIS — E039 Hypothyroidism, unspecified: Secondary | ICD-10-CM | POA: Diagnosis not present

## 2022-05-16 DIAGNOSIS — E7800 Pure hypercholesterolemia, unspecified: Secondary | ICD-10-CM | POA: Diagnosis not present

## 2022-05-24 DIAGNOSIS — E039 Hypothyroidism, unspecified: Secondary | ICD-10-CM | POA: Diagnosis not present

## 2022-05-24 DIAGNOSIS — M1990 Unspecified osteoarthritis, unspecified site: Secondary | ICD-10-CM | POA: Diagnosis not present

## 2022-05-24 DIAGNOSIS — E1165 Type 2 diabetes mellitus with hyperglycemia: Secondary | ICD-10-CM | POA: Diagnosis not present

## 2022-05-24 DIAGNOSIS — E611 Iron deficiency: Secondary | ICD-10-CM | POA: Diagnosis not present

## 2022-05-24 DIAGNOSIS — E7800 Pure hypercholesterolemia, unspecified: Secondary | ICD-10-CM | POA: Diagnosis not present

## 2022-05-24 DIAGNOSIS — E559 Vitamin D deficiency, unspecified: Secondary | ICD-10-CM | POA: Diagnosis not present

## 2022-05-25 ENCOUNTER — Encounter: Payer: Self-pay | Admitting: Cardiology

## 2022-05-26 NOTE — Progress Notes (Signed)
Labs 05/16/2022:  Hb 11.8/HCT 37.6, platelets 245.  Serum glucose 1 1 2  mg, BUN 13, creatinine 0.78, EGFR 80 mL, sodium 141, potassium 4.3, LFTs normal.  A1c 5.4%.  TSH 3.410.  Total cholesterol 175, triglycerides 72, HDL 78, LDL 83.

## 2022-05-28 MED ORDER — SIMVASTATIN 20 MG PO TABS: 20.0000 mg | ORAL_TABLET | Freq: Every day | ORAL | 3 refills | Status: DC

## 2022-05-31 ENCOUNTER — Encounter: Payer: Self-pay | Admitting: Cardiology

## 2022-05-31 ENCOUNTER — Ambulatory Visit: Payer: Medicare PPO | Admitting: Cardiology

## 2022-05-31 VITALS — BP 113/75 | HR 77 | Resp 16 | Ht 63.0 in | Wt 146.0 lb

## 2022-05-31 DIAGNOSIS — E782 Mixed hyperlipidemia: Secondary | ICD-10-CM | POA: Diagnosis not present

## 2022-05-31 DIAGNOSIS — I2510 Atherosclerotic heart disease of native coronary artery without angina pectoris: Secondary | ICD-10-CM | POA: Diagnosis not present

## 2022-05-31 DIAGNOSIS — I2584 Coronary atherosclerosis due to calcified coronary lesion: Secondary | ICD-10-CM | POA: Diagnosis not present

## 2022-05-31 NOTE — Progress Notes (Signed)
Primary Physician/Referring:  Mila Palmer, MD  Patient ID: Catherine [name], female    DOB: [birth year], 72 y.o.   MRN: [id number]  Chief Complaint  Patient presents with   Atrial Flutter   Follow-up    8 week   HPI:    Catherine [name]  is a 72 y.o.  female with a significant PMH for but not limited to prediabetes DM, GERD, hyperlipidemia, hypothyroidism, and cholelithiasis, established with [place] 3 months ago in view of cardiovascular risk factors and coronary calcification noted on the CT scan.  She also had brief atypical atrial flutter when she presented to the emergency room in March 2024.  No recurrence of palpitations, overall asymptomatic.  Past Medical History:  Diagnosis Date   Anal fissure    Anxiety    Arthritis    bilateral knees   Asthma    Barrett esophagus    normal now   Cholelithiasis    Diabetes mellitus    Diverticulosis    Dr Juanda Chance   Gastritis    GERD (gastroesophageal reflux disease)    Hyperlipidemia    Hyperplastic polyps of stomach    Hypothyroidism    IBS (irritable bowel syndrome)    Osteopenia    Recurrent UTI    seeing Dr Dahlsted-recent culture negative   Past Surgical History:  Procedure Laterality Date   CATARACT EXTRACTION Bilateral    R, then L.  Cataracts excision   CHOLECYSTECTOMY N/A 08/23/2020   Procedure: LAPAROSCOPIC CHOLECYSTECTOMY;  Surgeon: Almond Lint, MD;  Location: [facility];  Service: General;  Laterality: N/A;   DILATION AND CURETTAGE OF UTERUS     KNEE ARTHROSCOPY Right 08/2021   partial ligmanent tear   LAPAROSCOPY N/A 05/19/2012   Procedure: LAPAROSCOPY OPERATIVE;  Surgeon: Annamaria Boots, MD;  Location: [facility];  Service: Gynecology;  Laterality: N/A;   RADIOACTIVE SEED GUIDED EXCISIONAL BREAST BIOPSY Left 04/12/2020   Procedure: LEFT BREAST SEED LOCALIZED EXCISIONAL BIOPSY;  Surgeon: Almond Lint, MD;  Location: [facility];  Service: General;  Laterality: Left;   SALPINGOOPHORECTOMY Bilateral 05/19/2012   Procedure:  SALPINGO OOPHORECTOMY;  Surgeon: Annamaria Boots, MD;  Location: [facility];  Service: Gynecology;  Laterality: Bilateral;  including left dermoid cyst   TUBAL LIGATION     with D&C due to Incomplete AB   Family History  Problem Relation Age of Onset   Diabetes Mother 9       on insulin   Hypertension Father    Stroke Father    Diabetes Father 19       on po meds   Diabetes Sister        maternal aunt   Lymphoma Brother    Heart disease Brother        heart stent   Thyroid disease Son    Colon cancer Neg Hx     Social History   Tobacco Use   Smoking status: Never   Smokeless tobacco: Never  Substance Use Topics   Alcohol use: Never   Marital Status: Married  ROS  Review of Systems  Cardiovascular:  Negative for chest pain, dyspnea on exertion and leg swelling.   Objective      05/31/2022   11:39 AM 04/05/2022    9:09 AM 04/01/2022    1:00 PM  Vitals with BMI  Height 5\' 3"  5\' 3"    Weight 146 lbs 141 lbs   BMI 25.87 24.98   Systolic 113 105 130  Diastolic 75 71 74  Pulse 77 87 90   SpO2: 97 %   Physical Exam Neck:     Vascular: No carotid bruit or JVD.  Cardiovascular:     Rate and Rhythm: Normal rate and regular rhythm.     Pulses: Intact distal pulses.     Heart sounds: Normal heart sounds. No murmur heard.    No gallop.  Pulmonary:     Effort: Pulmonary effort is normal.     Breath sounds: Normal breath sounds.  Abdominal:     General: Bowel sounds are normal.     Palpations: Abdomen is soft.  Musculoskeletal:     Right lower leg: No edema.     Left lower leg: No edema.     Laboratory examination:   Recent Labs    04/01/22 1200  NA 138  K 3.5  CL 103  CO2 25  GLUCOSE 143*  BUN 9  CREATININE 0.82  CALCIUM 9.3  GFRNONAA >60    Lab Results  Component Value Date   GLUCOSE 143 (H) 04/01/2022   NA 138 04/01/2022   K 3.5 04/01/2022   CL 103 04/01/2022   CO2 25 04/01/2022   BUN 9 04/01/2022   CREATININE 0.82 04/01/2022   GFRNONAA  >60 04/01/2022   CALCIUM 9.3 04/01/2022   PROT 7.2 04/01/2022   ALBUMIN 3.5 04/01/2022   BILITOT 0.6 04/01/2022   ALKPHOS 115 04/01/2022   AST 19 04/01/2022   ALT 17 04/01/2022   ANIONGAP 10 04/01/2022      Lab Results  Component Value Date   ALT 17 04/01/2022   AST 19 04/01/2022   ALKPHOS 115 04/01/2022   BILITOT 0.6 04/01/2022       Latest Ref Rng & Units 04/01/2022   12:00 PM 08/17/2020    4:25 PM 06/20/2020    8:11 PM  Hepatic Function  Total Protein 6.5 - 8.1 g/dL 7.2  6.8  7.3   Albumin 3.5 - 5.0 g/dL 3.5  3.8  4.2   AST 15 - 41 U/L 19  15  11    ALT 0 - 44 U/L 17  16  12    Alk Phosphatase 38 - 126 U/L 115  80  97   Total Bilirubin 0.3 - 1.2 mg/dL 0.6  0.5  0.4    TSH Recent Labs    04/01/22 1200  TSH 2.337    External labs:   Labs 05/16/2022:  Hb 11.8/HCT 37.6, platelets 245.  Serum glucose 1 1 2  mg, BUN 13, creatinine 0.78, EGFR 80 mL, sodium 141, potassium 4.3, LFTs normal.  A1c 5.4%.  TSH 3.410.  Total cholesterol 175, triglycerides 72, HDL 78, LDL 83.   01/30/2022: A1c 6.5%. Vitamin D40.8. Total cholesterol 208, triglycerides 68, HDL 72, LDL 124.  Non-HDL cholesterol 136.  Radiology:   CT of the abdomen and pelvis 06/20/2020: Lower chest: Atelectatic changes in the otherwise clear lung bases. Normal heart size. No pericardial effusion. Coronary artery calcifications. Vascular/Lymphatic: Atherosclerotic calcifications within the abdominal aorta and branch vessels. No aneurysm or ectasia Gallbladder contains multiple calcified gallstones.  Cardiac Studies:    PCV ECHOCARDIOGRAM COMPLETE 04/24/2022  Narrative Echocardiogram 04/24/2022: Normal LV systolic function with visual EF 60-65%. Left ventricle cavity is normal in size. Normal left ventricular wall thickness. Normal global wall motion. Normal diastolic filling pattern, normal LAP. Calculated EF 67%. Structurally normal tricuspid valve.  Mild tricuspid regurgitation. No evidence of  pulmonary hypertension. No prior available for comparison.    PCV MYOCARDIAL PERFUSION WO LEXISCAN 04/12/2022  Narrative Exercise nuclear stress test 04/12/2022 Myocardial perfusion is normal. Breast attenuation is present. Overall LV systolic function is normal without regional wall motion abnormalities. Stress LV EF: 72%. Low risk study. Normal ECG stress. The patient exercised for 5 minutes and 56 seconds of a Modified Bruce protocol, achieving approximately 5.45 METs and 88% MPHR. Peak EKG/ECG demonstrated sinus tachycardia. The heart rate response was normal. The blood pressure response was normal. No previous exam available for comparison.   EKG:   EKG 04/05/2022: Normal sinus rhythm at the rate of 83 bpm, LAE, otherwise normal EKG.    Medications and allergies   Allergies  Allergen Reactions   Chocolate Hives and Itching   Chocolate Flavor Other (See Comments)   Erythromycin Diarrhea   Erythromycin Base     Other Reaction(s): Diarrhea   Iron Itching    Other Reaction(s): itching & rashes   Pneumococcal 13-Val Conj Vacc     Other Reaction(s): Rash, sore arm, fever   Pneumococcal Vaccines     Fever    Prednisone     Stomach burning    Shrimp Flavor Other (See Comments)   Shrimp [Shellfish Allergy]     hives   Sulfa Antibiotics Diarrhea   Tetanus-Diphtheria Toxoids Td     Other Reaction(s): Fever, sore arm     Medication list   Current Outpatient Medications:    ACCU-CHEK GUIDE test strip, , Disp: , Rfl:    albuterol (PROAIR HFA) 108 (90 Base) MCG/ACT inhaler, Inhale 1-2 puffs into the lungs every 6 (six) hours as needed. (Patient taking differently: Inhale 1-2 puffs into the lungs every 6 (six) hours as needed for shortness of breath or wheezing.), Disp: 8 g, Rfl: 2   aspirin EC 81 MG tablet, Take 1 tablet (81 mg total) by mouth daily., Disp: 90 tablet, Rfl: 3   betamethasone dipropionate 0.05 % cream, Apply 1 application topically daily as needed for rash., Disp: ,  Rfl:    bismuth subsalicylate (PEPTO BISMOL) 262 MG/15ML suspension, Take 30 mLs by mouth every 6 (six) hours as needed for indigestion., Disp: , Rfl:    cholecalciferol (VITAMIN D3) 25 MCG (1000 UNIT) tablet, Take 1,000 Units by mouth daily., Disp: , Rfl:    clobetasol ointment (TEMOVATE) 0.05 %, Apply topically 2 (two) times daily. Use only when has symptoms and do not use for more than 5 days., Disp: 30 g, Rfl: 1   colestipol (COLESTID) 1 g tablet, Take 1 g by mouth 2 (two) times daily., Disp: , Rfl:    desoximetasone (TOPICORT) 0.25 % cream, Apply 1 application topically daily as needed (Arm rash)., Disp: , Rfl:    esomeprazole (NEXIUM) 40 MG capsule, Take 1 capsule (40 mg total) by mouth 2 (two) times daily. (Patient taking differently: Take 40 mg by mouth daily.), Disp: 60 capsule, Rfl: 10   estradiol (ESTRACE) 0.1 MG/GM vaginal cream, USE PEA SIZED AMOUNT OF CREAM EXTERNALLY EVERY MON, WED, AND SAT, Disp: 42.5 g, Rfl: 2   fexofenadine (ALLEGRA) 180 MG tablet, Take 180 mg by mouth at bedtime as needed for allergies., Disp: , Rfl:    fluticasone (FLONASE) 50 MCG/ACT nasal spray, Place 1 spray into both nostrils daily as needed for allergies., Disp: , Rfl: 11   levothyroxine (SYNTHROID, LEVOTHROID) 25 MCG tablet, Take 25-50 mcg by mouth See admin instructions. Take 50 mcg daily on Mon, Tues, Wed, Thurs, and Fri. Take 25 mcg daily on Sat and Sun, Disp: , Rfl:    loratadine (CLARITIN)  10 MG tablet, Take 10 mg by mouth daily as needed for allergies., Disp: , Rfl:    meclizine (ANTIVERT) 25 MG tablet, Take 25 mg by mouth as needed., Disp: , Rfl:    naproxen sodium (ALEVE) 220 MG tablet, Take 440 mg by mouth 2 (two) times daily as needed (pain)., Disp: , Rfl:    Polyethyl Glycol-Propyl Glycol 0.4-0.3 % SOLN, Place 1 drop into both eyes at bedtime., Disp: , Rfl:    Probiotic Product (VSL#3 PO), Take 1 capsule by mouth daily., Disp: , Rfl:    simvastatin (ZOCOR) 20 MG tablet, Take 1 tablet (20 mg total)  by mouth at bedtime., Disp: 90 tablet, Rfl: 3   doxycycline (VIBRAMYCIN) 100 MG capsule, Take 1 capsule (100 mg total) by mouth every 12 (twelve) hours. (Patient not taking: Reported on 05/31/2022), Disp: 14 capsule, Rfl: 0  Assessment     ICD-10-CM   1. Coronary atherosclerosis due to calcified coronary lesion  I25.10    I25.84     2. Mixed hyperlipidemia  E78.2        No orders of the defined types were placed in this encounter.   No orders of the defined types were placed in this encounter.   Medications Discontinued During This Encounter  Medication Reason   ondansetron (ZOFRAN ODT) 4 MG disintegrating tablet    dicyclomine (BENTYL) 20 MG tablet      Recommendations:   Catherine [name]  is a 72 y.o. female with a significant PMH for but not limited to prediabetes DM, GERD, hyperlipidemia, hypothyroidism, and cholelithiasis, established with [place] 3 months ago in view of cardiovascular risk factors and coronary calcification noted on the CT scan.  She also had brief atypical atrial flutter when she presented to the emergency room in March 2024.  No recurrence of palpitations, overall asymptomatic.  1. Coronary atherosclerosis due to calcified coronary lesion Extensive review with the patient regarding the stress test which reveals no evidence of ischemia, low risk.  Echocardiogram reviewed, mild tricuspid valve regurgitation.  No further follow-up is indicated.  2. Mixed hyperlipidemia I had increase the dose of simvastatin to 40 mg however patient developed headaches hence he is back on 20 mg dose of simvastatin in the evening.  She had repeat labs done, lipid profile testing reveals well-controlled LDL goal <100.  In the absence of diabetes she is at goal.  Continue present management.  In 2022, A1c was 6.1%.  Upon review of recent labs from 2024, A1c is 5.4%.  I reviewed her recent labs.  I wanted to see her back on a as needed basis but patient request that I see her back on annual  basis.   Yates Decamp, MD, [facility] 05/31/2022, 5:46 PM Office: [phone]

## 2022-06-12 DIAGNOSIS — H524 Presbyopia: Secondary | ICD-10-CM | POA: Diagnosis not present

## 2022-06-12 DIAGNOSIS — H353131 Nonexudative age-related macular degeneration, bilateral, early dry stage: Secondary | ICD-10-CM | POA: Diagnosis not present

## 2022-06-12 DIAGNOSIS — H40013 Open angle with borderline findings, low risk, bilateral: Secondary | ICD-10-CM | POA: Diagnosis not present

## 2022-06-12 DIAGNOSIS — H04123 Dry eye syndrome of bilateral lacrimal glands: Secondary | ICD-10-CM | POA: Diagnosis not present

## 2022-06-12 DIAGNOSIS — E119 Type 2 diabetes mellitus without complications: Secondary | ICD-10-CM | POA: Diagnosis not present

## 2022-06-20 ENCOUNTER — Encounter (INDEPENDENT_AMBULATORY_CARE_PROVIDER_SITE_OTHER): Payer: Medicare PPO | Admitting: Ophthalmology

## 2022-06-27 DIAGNOSIS — R1030 Lower abdominal pain, unspecified: Secondary | ICD-10-CM | POA: Diagnosis not present

## 2022-06-27 DIAGNOSIS — K5792 Diverticulitis of intestine, part unspecified, without perforation or abscess without bleeding: Secondary | ICD-10-CM | POA: Diagnosis not present

## 2022-08-29 DIAGNOSIS — N952 Postmenopausal atrophic vaginitis: Secondary | ICD-10-CM | POA: Diagnosis not present

## 2022-08-29 DIAGNOSIS — N3020 Other chronic cystitis without hematuria: Secondary | ICD-10-CM | POA: Diagnosis not present

## 2022-08-29 DIAGNOSIS — R102 Pelvic and perineal pain: Secondary | ICD-10-CM | POA: Diagnosis not present

## 2022-09-06 DIAGNOSIS — Z8249 Family history of ischemic heart disease and other diseases of the circulatory system: Secondary | ICD-10-CM | POA: Diagnosis not present

## 2022-09-06 DIAGNOSIS — I951 Orthostatic hypotension: Secondary | ICD-10-CM | POA: Diagnosis not present

## 2022-09-06 DIAGNOSIS — E1122 Type 2 diabetes mellitus with diabetic chronic kidney disease: Secondary | ICD-10-CM | POA: Diagnosis not present

## 2022-09-06 DIAGNOSIS — M1990 Unspecified osteoarthritis, unspecified site: Secondary | ICD-10-CM | POA: Diagnosis not present

## 2022-09-06 DIAGNOSIS — E785 Hyperlipidemia, unspecified: Secondary | ICD-10-CM | POA: Diagnosis not present

## 2022-09-06 DIAGNOSIS — J4520 Mild intermittent asthma, uncomplicated: Secondary | ICD-10-CM | POA: Diagnosis not present

## 2022-09-06 DIAGNOSIS — K219 Gastro-esophageal reflux disease without esophagitis: Secondary | ICD-10-CM | POA: Diagnosis not present

## 2022-09-06 DIAGNOSIS — N182 Chronic kidney disease, stage 2 (mild): Secondary | ICD-10-CM | POA: Diagnosis not present

## 2022-09-06 DIAGNOSIS — E039 Hypothyroidism, unspecified: Secondary | ICD-10-CM | POA: Diagnosis not present

## 2022-10-17 ENCOUNTER — Ambulatory Visit: Payer: Medicare PPO | Admitting: Podiatry

## 2022-10-22 DIAGNOSIS — L304 Erythema intertrigo: Secondary | ICD-10-CM | POA: Diagnosis not present

## 2022-10-29 ENCOUNTER — Ambulatory Visit: Payer: Medicare PPO | Admitting: Podiatry

## 2022-10-29 DIAGNOSIS — M2061 Acquired deformities of toe(s), unspecified, right foot: Secondary | ICD-10-CM

## 2022-10-29 DIAGNOSIS — M79675 Pain in left toe(s): Secondary | ICD-10-CM | POA: Diagnosis not present

## 2022-10-29 DIAGNOSIS — M79674 Pain in right toe(s): Secondary | ICD-10-CM | POA: Diagnosis not present

## 2022-10-29 DIAGNOSIS — B351 Tinea unguium: Secondary | ICD-10-CM

## 2022-10-29 NOTE — Progress Notes (Unsigned)
Subjective:  Patient ID: Catherine [name], female    DOB: [birth year],  MRN: [id number]  Catherine [name] presents to clinic today for:  Chief Complaint  Patient presents with   Nail Problem    [facility]-  here for nail care.  Her primary care physician is Dr. Mila Palmer, MD  in January 2024.  She had the laser for nail fungus that has helped.   Pain in the second toenail-  in the winter it hurts in enclosed shoes. Using formula 7 and wants a refill of this medication.    Patient notes nails are thick, discolored, elongated and painful in shoegear when trying to ambulate.  Patient is noting pain in the second toe when she transitions to close toed shoes through the winter.  She did have laser nail treatment for her nail fungus and states that that did help overall.  She is requesting refill of the formula 7 that she was using on her nail fungus.  PCP is Mila Palmer, MD.  Past Medical History:  Diagnosis Date   Anal fissure    Anxiety    Arthritis    bilateral knees   Asthma    Barrett esophagus    normal now   Cholelithiasis    Diabetes mellitus    Diverticulosis    Dr Juanda Chance   Gastritis    GERD (gastroesophageal reflux disease)    Hyperlipidemia    Hyperplastic polyps of stomach    Hypothyroidism    IBS (irritable bowel syndrome)    Osteopenia    Recurrent UTI    seeing Dr Dahlsted-recent culture negative    Past Surgical History:  Procedure Laterality Date   CATARACT EXTRACTION Bilateral    R, then L.  Cataracts excision   CHOLECYSTECTOMY N/A 08/23/2020   Procedure: LAPAROSCOPIC CHOLECYSTECTOMY;  Surgeon: Almond Lint, MD;  Location: [facility];  Service: General;  Laterality: N/A;   DILATION AND CURETTAGE OF UTERUS     KNEE ARTHROSCOPY Right 08/2021   partial ligmanent tear   LAPAROSCOPY N/A 05/19/2012   Procedure: LAPAROSCOPY OPERATIVE;  Surgeon: Annamaria Boots, MD;  Location: [facility];  Service: Gynecology;  Laterality: N/A;   RADIOACTIVE SEED  GUIDED EXCISIONAL BREAST BIOPSY Left 04/12/2020   Procedure: LEFT BREAST SEED LOCALIZED EXCISIONAL BIOPSY;  Surgeon: Almond Lint, MD;  [facility]: [facility];  Service: General;  Laterality: Left;   SALPINGOOPHORECTOMY Bilateral 05/19/2012   Procedure: SALPINGO OOPHORECTOMY;  Surgeon: Annamaria Boots, MD;  Location: [facility];  Service: Gynecology;  Laterality: Bilateral;  including left dermoid cyst   TUBAL LIGATION     with D&C due to Incomplete AB    Allergies  Allergen Reactions   Chocolate Hives and Itching   Chocolate Flavor Other (See Comments)   Erythromycin Diarrhea   Erythromycin Base     Other Reaction(s): Diarrhea   Iron Itching    Other Reaction(s): itching & rashes   Pneumococcal 13-Val Conj Vacc     Other Reaction(s): Rash, sore arm, fever   Pneumococcal Vaccines     Fever    Prednisone     Stomach burning    Shrimp Flavor Other (See Comments)   Shrimp [Shellfish Allergy]     hives   Sulfa Antibiotics Diarrhea   Tetanus-Diphtheria Toxoids Td     Other Reaction(s): Fever, sore arm    Review of Systems: Negative except as noted in the HPI.  Objective:  Catherine [name] is a pleasant 72 y.o. female  in NAD. AAO x 3.  Vascular Examination: Capillary refill time is 3-5 seconds to toes bilateral. Palpable pedal pulses b/l LE. Digital hair present b/l.  Skin temperature gradient WNL b/l. No varicosities b/l. No cyanosis noted b/l.   Dermatological Examination: Pedal skin with normal turgor, texture and tone b/l. No open wounds. No interdigital macerations b/l. Toenails x10 are 3mm thick, discolored, dystrophic with subungual debris. There is pain with compression of the nail plates.  They are elongated x10  Musculoskeletal Examination: Muscle strength 5/5 to all LE muscle groups b/l.  The second toes are the longest toes of her feet with the right second toe being even longer than the second toe on the left foot.  Her shoes were evaluated today and her toe impressions are  all the way at the tip of her enclosed shoes.  The pressure from the front of the shoe may be what is causing her pain at this time.  Assessment/Plan: 1. Pain due to onychomycosis of toenails of both feet   2. Acquired deformity of right toe     The mycotic toenails were sharply debrided x10 with sterile nail nippers and a power debriding burr to decrease bulk/thickness and length.    Recommended patient go up a half size in her closed toe shoes.  She was shown where her toes are hitting at the end of the shoe so that she can see for herself that she is all the way at the very front of the shoe and the shoes are not made for a longer second toe typically.  She can continue to use the gel toe cap but ultimately this will take up more space in the shoe and put more pressure on the toes.  Will message the front desk to pull formula 7 for her to purchase at checkout.  Return in about 3 months (around 01/29/2023) for RFC with Dr. Ralene Cork.   Clerance Lav, DPM, FACFAS [facility]     [address] [place], [place] [address]                Office [phone]  Fax [phone]

## 2022-11-05 DIAGNOSIS — H938X1 Other specified disorders of right ear: Secondary | ICD-10-CM | POA: Diagnosis not present

## 2022-11-05 DIAGNOSIS — Z23 Encounter for immunization: Secondary | ICD-10-CM | POA: Diagnosis not present

## 2022-11-05 DIAGNOSIS — F40243 Fear of flying: Secondary | ICD-10-CM | POA: Diagnosis not present

## 2022-11-28 ENCOUNTER — Other Ambulatory Visit (HOSPITAL_BASED_OUTPATIENT_CLINIC_OR_DEPARTMENT_OTHER): Payer: Self-pay | Admitting: *Deleted

## 2022-11-28 DIAGNOSIS — Z780 Asymptomatic menopausal state: Secondary | ICD-10-CM

## 2022-12-14 DIAGNOSIS — Z1231 Encounter for screening mammogram for malignant neoplasm of breast: Secondary | ICD-10-CM | POA: Diagnosis not present

## 2022-12-25 DIAGNOSIS — E538 Deficiency of other specified B group vitamins: Secondary | ICD-10-CM | POA: Diagnosis not present

## 2022-12-25 DIAGNOSIS — E559 Vitamin D deficiency, unspecified: Secondary | ICD-10-CM | POA: Diagnosis not present

## 2022-12-25 DIAGNOSIS — E611 Iron deficiency: Secondary | ICD-10-CM | POA: Diagnosis not present

## 2022-12-25 DIAGNOSIS — E039 Hypothyroidism, unspecified: Secondary | ICD-10-CM | POA: Diagnosis not present

## 2022-12-25 DIAGNOSIS — E1165 Type 2 diabetes mellitus with hyperglycemia: Secondary | ICD-10-CM | POA: Diagnosis not present

## 2022-12-25 DIAGNOSIS — E7800 Pure hypercholesterolemia, unspecified: Secondary | ICD-10-CM | POA: Diagnosis not present

## 2022-12-31 DIAGNOSIS — E559 Vitamin D deficiency, unspecified: Secondary | ICD-10-CM | POA: Diagnosis not present

## 2022-12-31 DIAGNOSIS — M1990 Unspecified osteoarthritis, unspecified site: Secondary | ICD-10-CM | POA: Diagnosis not present

## 2022-12-31 DIAGNOSIS — E039 Hypothyroidism, unspecified: Secondary | ICD-10-CM | POA: Diagnosis not present

## 2022-12-31 DIAGNOSIS — E611 Iron deficiency: Secondary | ICD-10-CM | POA: Diagnosis not present

## 2022-12-31 DIAGNOSIS — E7800 Pure hypercholesterolemia, unspecified: Secondary | ICD-10-CM | POA: Diagnosis not present

## 2022-12-31 DIAGNOSIS — E1165 Type 2 diabetes mellitus with hyperglycemia: Secondary | ICD-10-CM | POA: Diagnosis not present

## 2023-01-17 ENCOUNTER — Encounter (HOSPITAL_BASED_OUTPATIENT_CLINIC_OR_DEPARTMENT_OTHER): Payer: Self-pay

## 2023-02-01 DIAGNOSIS — D649 Anemia, unspecified: Secondary | ICD-10-CM | POA: Diagnosis not present

## 2023-02-01 DIAGNOSIS — E1169 Type 2 diabetes mellitus with other specified complication: Secondary | ICD-10-CM | POA: Diagnosis not present

## 2023-02-01 DIAGNOSIS — E559 Vitamin D deficiency, unspecified: Secondary | ICD-10-CM | POA: Diagnosis not present

## 2023-02-01 DIAGNOSIS — E039 Hypothyroidism, unspecified: Secondary | ICD-10-CM | POA: Diagnosis not present

## 2023-02-01 DIAGNOSIS — Z79899 Other long term (current) drug therapy: Secondary | ICD-10-CM | POA: Diagnosis not present

## 2023-02-01 DIAGNOSIS — E7800 Pure hypercholesterolemia, unspecified: Secondary | ICD-10-CM | POA: Diagnosis not present

## 2023-02-01 DIAGNOSIS — D509 Iron deficiency anemia, unspecified: Secondary | ICD-10-CM | POA: Diagnosis not present

## 2023-02-04 ENCOUNTER — Ambulatory Visit: Payer: Medicare PPO | Admitting: Podiatry

## 2023-02-04 DIAGNOSIS — M79675 Pain in left toe(s): Secondary | ICD-10-CM | POA: Diagnosis not present

## 2023-02-04 DIAGNOSIS — M79674 Pain in right toe(s): Secondary | ICD-10-CM

## 2023-02-04 DIAGNOSIS — B351 Tinea unguium: Secondary | ICD-10-CM

## 2023-02-04 NOTE — Progress Notes (Signed)
Subjective:  Patient ID: Catherine [name], female    DOB: 03/26/1950,  MRN: [id number]   Catherine [name] presents to clinic today for:  Chief Complaint  Patient presents with   Diabetes    DFC A1C - 6.4    Patient notes nails are thick, discolored, elongated and painful in shoegear when trying to ambulate.    PCP is Mila Palmer, MD.  Past Medical History:  Diagnosis Date   Anal fissure    Anxiety    Arthritis    bilateral knees   Asthma    Barrett esophagus    normal now   Cholelithiasis    Diabetes mellitus    Diverticulosis    Dr Juanda Chance   Gastritis    GERD (gastroesophageal reflux disease)    Hyperlipidemia    Hyperplastic polyps of stomach    Hypothyroidism    IBS (irritable bowel syndrome)    Osteopenia    Recurrent UTI    seeing Dr Dahlsted-recent culture negative    Past Surgical History:  Procedure Laterality Date   CATARACT EXTRACTION Bilateral    R, then L.  Cataracts excision   CHOLECYSTECTOMY N/A 08/23/2020   Procedure: LAPAROSCOPIC CHOLECYSTECTOMY;  Surgeon: Almond Lint, MD;  [facility]: [facility];  Service: General;  Laterality: N/A;   DILATION AND CURETTAGE OF UTERUS     KNEE ARTHROSCOPY Right 08/2021   partial ligmanent tear   LAPAROSCOPY N/A 05/19/2012   Procedure: LAPAROSCOPY OPERATIVE;  Surgeon: Annamaria Boots, MD;  Location: [facility];  Service: Gynecology;  Laterality: N/A;   RADIOACTIVE SEED GUIDED EXCISIONAL BREAST BIOPSY Left 04/12/2020   Procedure: LEFT BREAST SEED LOCALIZED EXCISIONAL BIOPSY;  Surgeon: Almond Lint, MD;  Location: [facility];  Service: General;  Laterality: Left;   SALPINGOOPHORECTOMY Bilateral 05/19/2012   Procedure: SALPINGO OOPHORECTOMY;  Surgeon: Annamaria Boots, MD;  Location: [facility];  Service: Gynecology;  Laterality: Bilateral;  including left dermoid cyst   TUBAL LIGATION     with D&C due to Incomplete AB    Allergies  Allergen Reactions   Chocolate Hives and Itching   Chocolate Flavoring Agent  (Non-Screening) Other (See Comments)   Erythromycin Diarrhea   Erythromycin Base     Other Reaction(s): Diarrhea   Iron Itching    Other Reaction(s): itching & rashes   Pneumococcal 13-Val Conj Vacc     Other Reaction(s): Rash, sore arm, fever   Pneumococcal Vaccines     Fever    Prednisone     Stomach burning    Shrimp Flavor Agent (Non-Screening) Other (See Comments)   Shrimp [Shellfish Allergy]     hives   Sulfa Antibiotics Diarrhea   Tetanus-Diphtheria Toxoids Td     Other Reaction(s): Fever, sore arm    Review of Systems: Negative except as noted in the HPI.  Objective:  Catherine [name] is a pleasant 73 y.o. female in NAD. AAO x 3.  Vascular Examination: Capillary refill time is 3-5 seconds to toes bilateral. Palpable pedal pulses b/l LE. Digital hair present b/l.  Skin temperature gradient WNL b/l. No varicosities b/l. No cyanosis noted b/l.   Dermatological Examination: Pedal skin with normal turgor, texture and tone b/l. No open wounds. No interdigital macerations b/l. Toenails x10 are 3mm thick, discolored, dystrophic with subungual debris. There is pain with compression of the nail plates.  They are elongated x10  Assessment/Plan: 1. Pain due to onychomycosis of toenails of both feet    The mycotic  toenails were sharply debrided x10 with sterile nail nippers and a power debriding burr to decrease bulk/thickness and length.    Return in about 3 months (around 05/05/2023) for [facility].   Clerance Lav, DPM, FACFAS [facility]     [address] [place], [place] [address]                Office [phone]  Fax [phone]

## 2023-02-05 DIAGNOSIS — Z0000 Encounter for general adult medical examination without abnormal findings: Secondary | ICD-10-CM | POA: Diagnosis not present

## 2023-02-05 DIAGNOSIS — E559 Vitamin D deficiency, unspecified: Secondary | ICD-10-CM | POA: Diagnosis not present

## 2023-02-05 DIAGNOSIS — L509 Urticaria, unspecified: Secondary | ICD-10-CM | POA: Diagnosis not present

## 2023-02-05 DIAGNOSIS — D649 Anemia, unspecified: Secondary | ICD-10-CM | POA: Diagnosis not present

## 2023-02-05 DIAGNOSIS — E039 Hypothyroidism, unspecified: Secondary | ICD-10-CM | POA: Diagnosis not present

## 2023-02-05 DIAGNOSIS — D509 Iron deficiency anemia, unspecified: Secondary | ICD-10-CM | POA: Diagnosis not present

## 2023-02-05 DIAGNOSIS — E1169 Type 2 diabetes mellitus with other specified complication: Secondary | ICD-10-CM | POA: Diagnosis not present

## 2023-02-05 DIAGNOSIS — Z79899 Other long term (current) drug therapy: Secondary | ICD-10-CM | POA: Diagnosis not present

## 2023-02-05 DIAGNOSIS — E7800 Pure hypercholesterolemia, unspecified: Secondary | ICD-10-CM | POA: Diagnosis not present

## 2023-02-07 DIAGNOSIS — L304 Erythema intertrigo: Secondary | ICD-10-CM | POA: Diagnosis not present

## 2023-02-18 ENCOUNTER — Ambulatory Visit: Payer: Medicare PPO | Admitting: Podiatry

## 2023-03-11 IMAGING — CT CT ABD-PELV W/ CM
2 of 5 series · 15 of 46 positions shown, 17 images · IV contrast (APPLIED)
Comparison: CT 05/02/2012

CLINICAL DATA: Abdominal pain since lunch at 1133 hours

EXAM:
CT ABDOMEN AND PELVIS WITH CONTRAST
TECHNIQUE: Multidetector CT imaging of the abdomen and pelvis was performed
using the standard protocol following bolus administration of
intravenous contrast.
CONTRAST:  75mL OMNIPAQUE IOHEXOL 300 MG/ML  SOLN

[Series 2: abd pel w · axial · 0.73mm/px · z∈[+727,+1087]mm · 12 of 82 slices shown, 14 images]
[im 5/82  soft-tissue]
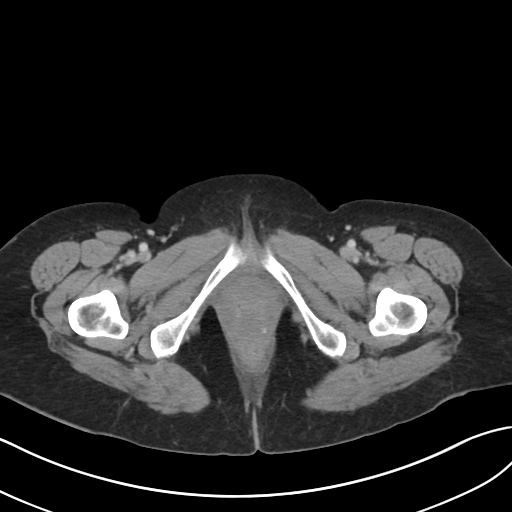
[im 5/82  bone]
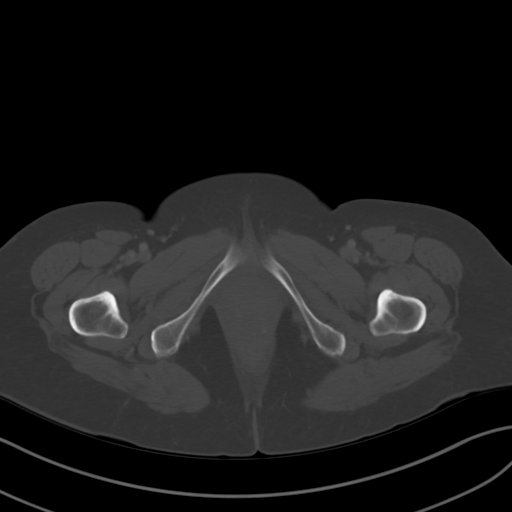
[im 13/82  soft-tissue]
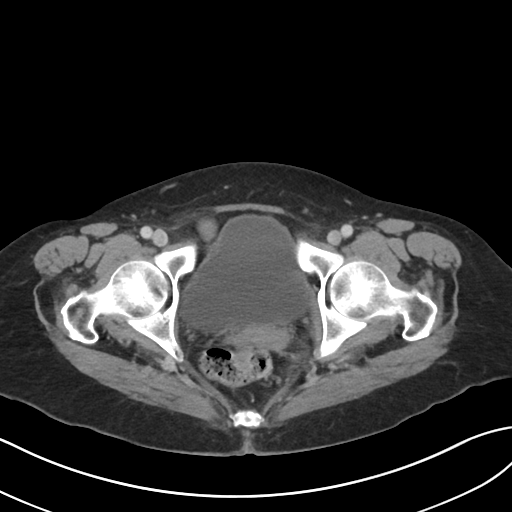
[im 18/82  soft-tissue]
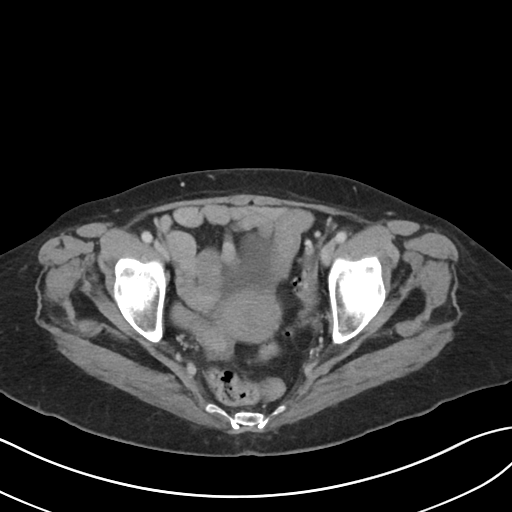
[im 26/82  soft-tissue]
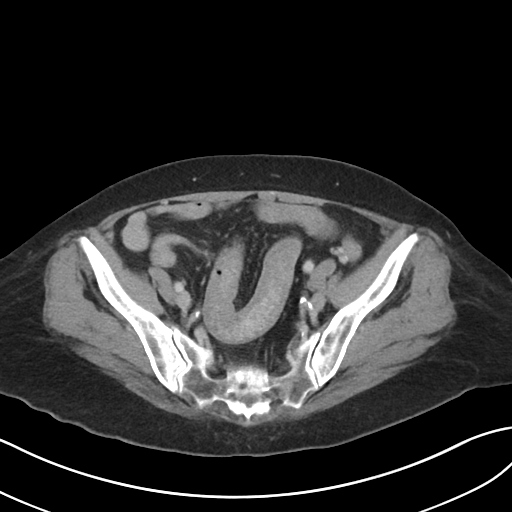
[im 30/82  soft-tissue]
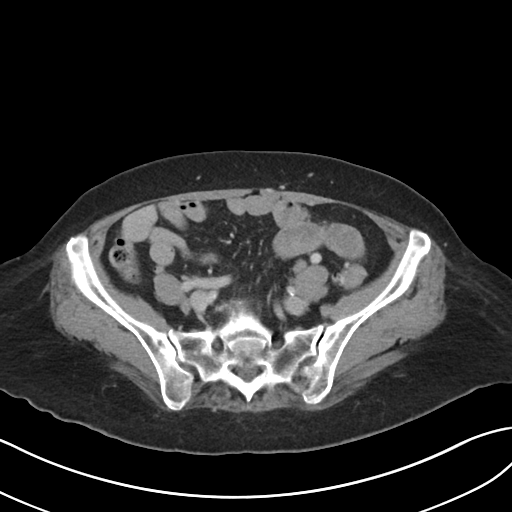
[im 39/82  soft-tissue]
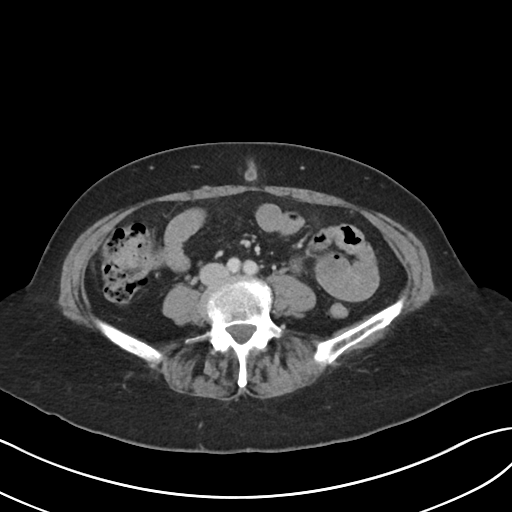
[im 43/82  soft-tissue]
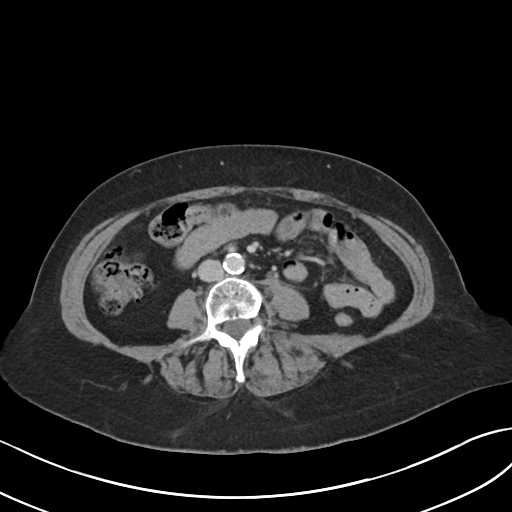
[im 52/82  soft-tissue]
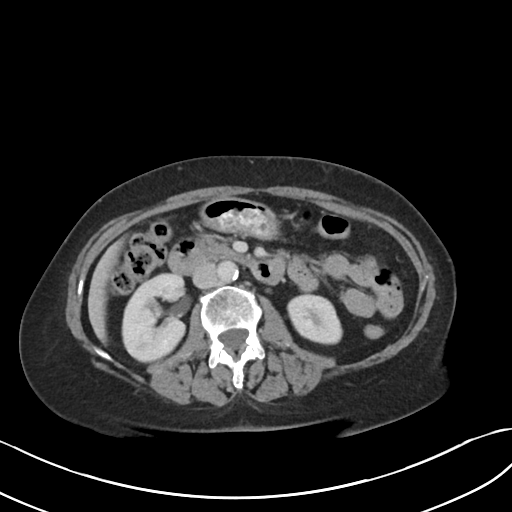
[im 56/82  soft-tissue]
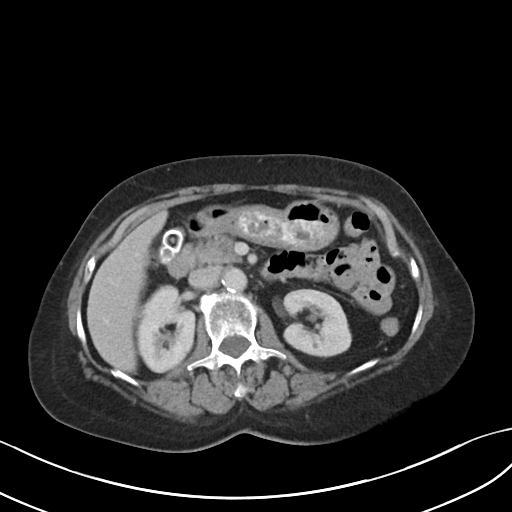
[im 56/82  bone]
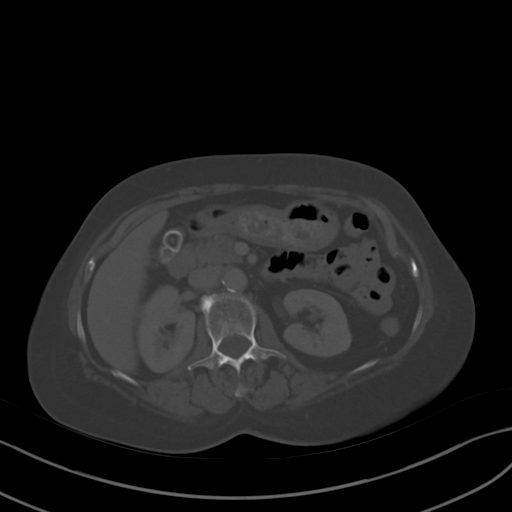
[im 64/82  soft-tissue]
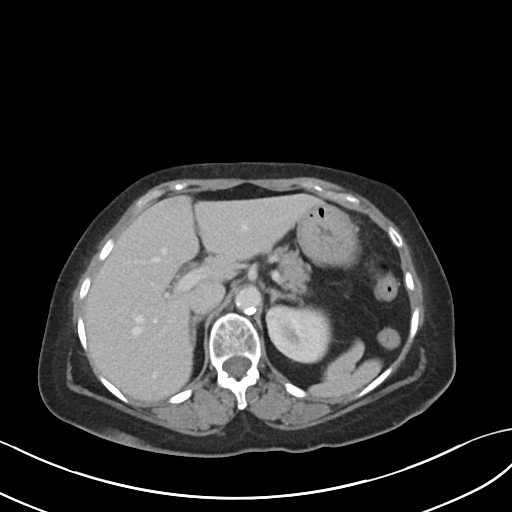
[im 69/82  soft-tissue]
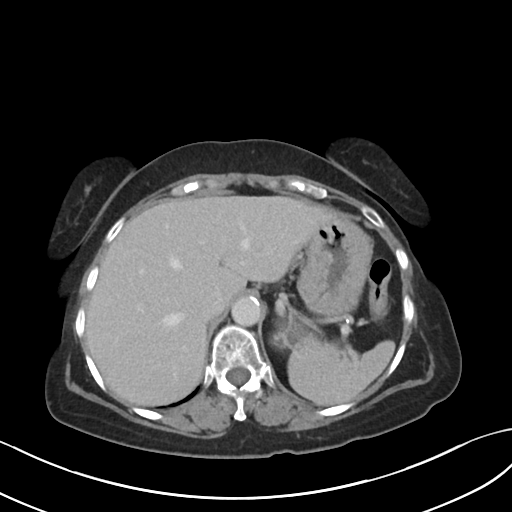
[im 77/82  soft-tissue]
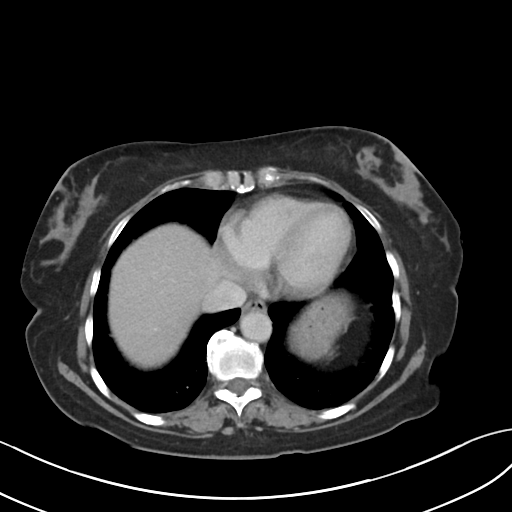

[Series 5: coronal · coronal · 0.74mm/px · 3 of 76 slices shown]
[im 26/76  soft-tissue]
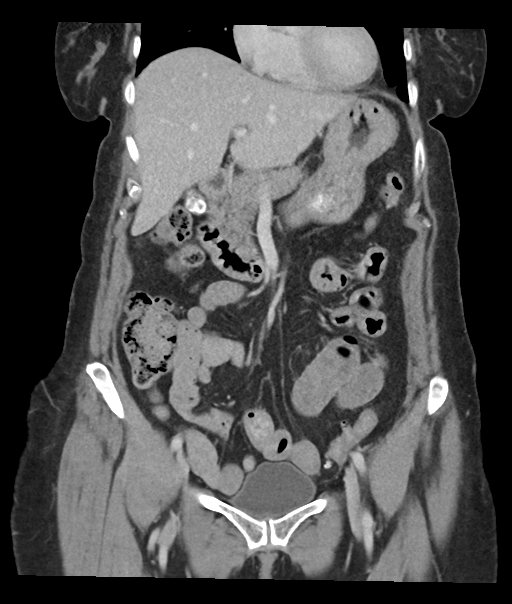
[im 34/76  soft-tissue]
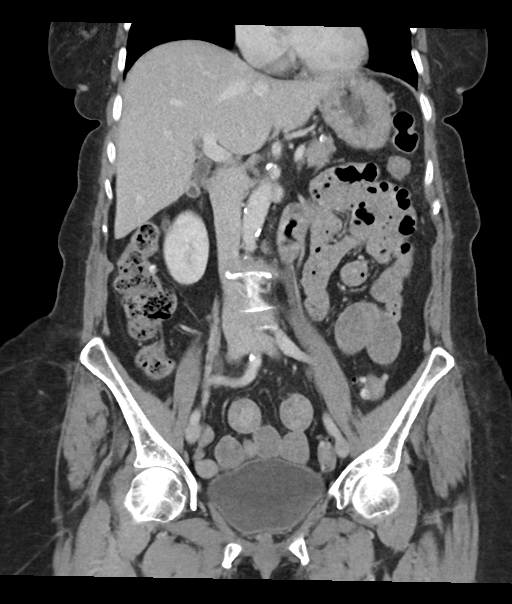
[im 42/76  soft-tissue]
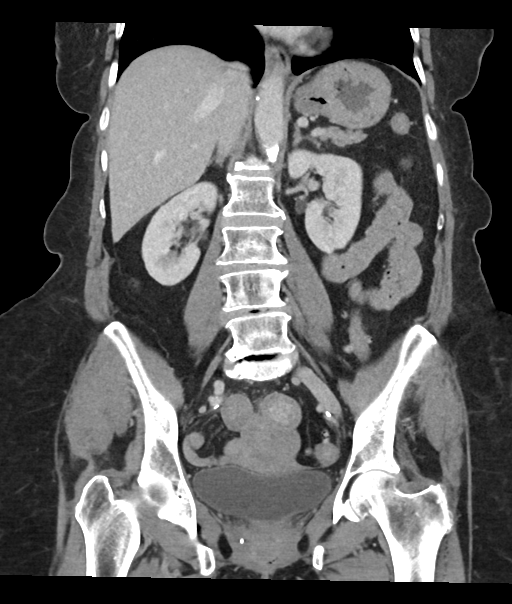

[15 of 46 positions shown; findings below may reference images not displayed]

FINDINGS: Lower chest: Atelectatic changes in the otherwise clear lung bases.
Normal heart size. No pericardial effusion. Coronary artery
calcifications.

Hepatobiliary: No concerning focal liver lesion. Normal hepatic
attenuation. Smooth surface contour.Gallbladder contains multiple
calcified gallstones. No significant pericholecystic fluid or
inflammation. No gallbladder wall thickening. No significant biliary
ductal dilatation or visible intraductal gallstones.

Pancreas: No pancreatic ductal dilatation or surrounding
inflammatory changes.

Spleen: Normal in size. No concerning splenic lesions.

Adrenals/Urinary Tract: Normal adrenals. Kidneys are normally
located with symmetric enhancementand excretion. Few tiny
subcentimeter hypoattenuating foci in both kidneys too small to
fully characterize on CT imaging but statistically likely benign.
No suspicious renal lesion, urolithiasis or hydronephrosis. Urinary
bladder is unremarkable for degree of distension.

Stomach/Bowel: Distal esophagus, stomach and duodenum are
unremarkable. Focally thickened segment of small bowel noted in the
low midline abdomen ([DATE]) with some increased surrounding
mesenteric vascularity. Diffusely fluid-filled appearance throughout
this segment and much of the distal small bowel. No other
significant mural thickening. No worrisome dilatation. A normal
appendix is visualized. No colonic dilatation or wall thickening.
Scattered colonic diverticula without focal inflammation to suggest
diverticulitis.

Vascular/Lymphatic: Atherosclerotic calcifications within the
abdominal aorta and branch vessels. No aneurysm or ectasia. No
enlarged abdominopelvic lymph nodes.

Reproductive: Anteverted uterus.  No concerning adnexal lesions.

Other: No abdominopelvic free fluid or free gas. No bowel containing
hernias. Tiny fat containing umbilical hernia.

Musculoskeletal: No acute osseous abnormality or suspicious osseous
lesion. Multilevel degenerative changes are present in the imaged
portions of the spine. Features most pronounced L5-S1. Slight
dextrocurvature of the lumbar spine similar to priors, apex L3.
Additional degenerative changes in the hips and pelvis.
IMPRESSION: Focally thickened segment of distal small bowel in the low midline
abdomen with some increased mesenteric vascularity, could reflect a
nonspecific focal enteritis in the appropriate clinical setting.

Colonic diverticulosis without evidence of acute diverticulitis.

Cholelithiasis without evidence of acute cholecystitis, visible
choledocholithiasis or biliary ductal dilatation.

Aortic Atherosclerosis (CXFTH-3NA.A).  Coronary atherosclerosis.

## 2023-03-14 DIAGNOSIS — H938X3 Other specified disorders of ear, bilateral: Secondary | ICD-10-CM | POA: Diagnosis not present

## 2023-03-14 DIAGNOSIS — Z03818 Encounter for observation for suspected exposure to other biological agents ruled out: Secondary | ICD-10-CM | POA: Diagnosis not present

## 2023-03-14 DIAGNOSIS — R053 Chronic cough: Secondary | ICD-10-CM | POA: Diagnosis not present

## 2023-04-09 DIAGNOSIS — F40243 Fear of flying: Secondary | ICD-10-CM | POA: Diagnosis not present

## 2023-04-09 DIAGNOSIS — H938X2 Other specified disorders of left ear: Secondary | ICD-10-CM | POA: Diagnosis not present

## 2023-04-09 DIAGNOSIS — R051 Acute cough: Secondary | ICD-10-CM | POA: Diagnosis not present

## 2023-04-24 DIAGNOSIS — R102 Pelvic and perineal pain: Secondary | ICD-10-CM | POA: Diagnosis not present

## 2023-04-24 DIAGNOSIS — N3020 Other chronic cystitis without hematuria: Secondary | ICD-10-CM | POA: Diagnosis not present

## 2023-04-24 DIAGNOSIS — N952 Postmenopausal atrophic vaginitis: Secondary | ICD-10-CM | POA: Diagnosis not present

## 2023-05-06 ENCOUNTER — Ambulatory Visit: Payer: Medicare PPO | Admitting: Podiatry

## 2023-05-10 DIAGNOSIS — E1165 Type 2 diabetes mellitus with hyperglycemia: Secondary | ICD-10-CM | POA: Diagnosis not present

## 2023-05-10 DIAGNOSIS — E039 Hypothyroidism, unspecified: Secondary | ICD-10-CM | POA: Diagnosis not present

## 2023-05-13 DIAGNOSIS — G514 Facial myokymia: Secondary | ICD-10-CM | POA: Diagnosis not present

## 2023-05-22 DIAGNOSIS — M8588 Other specified disorders of bone density and structure, other site: Secondary | ICD-10-CM | POA: Diagnosis not present

## 2023-05-28 DIAGNOSIS — Z860100 Personal history of colon polyps, unspecified: Secondary | ICD-10-CM | POA: Diagnosis not present

## 2023-05-28 DIAGNOSIS — K9089 Other intestinal malabsorption: Secondary | ICD-10-CM | POA: Diagnosis not present

## 2023-05-28 DIAGNOSIS — K5730 Diverticulosis of large intestine without perforation or abscess without bleeding: Secondary | ICD-10-CM | POA: Diagnosis not present

## 2023-05-28 DIAGNOSIS — R1033 Periumbilical pain: Secondary | ICD-10-CM | POA: Diagnosis not present

## 2023-05-29 ENCOUNTER — Ambulatory Visit: Payer: Medicare PPO | Admitting: Cardiology

## 2023-05-29 ENCOUNTER — Ambulatory Visit: Payer: Medicare PPO | Attending: Cardiology | Admitting: Cardiology

## 2023-05-29 ENCOUNTER — Encounter: Payer: Self-pay | Admitting: Cardiology

## 2023-05-29 VITALS — BP 98/56 | HR 64 | Ht 63.0 in | Wt 148.4 lb

## 2023-05-29 DIAGNOSIS — I2584 Coronary atherosclerosis due to calcified coronary lesion: Secondary | ICD-10-CM

## 2023-05-29 DIAGNOSIS — I2510 Atherosclerotic heart disease of native coronary artery without angina pectoris: Secondary | ICD-10-CM

## 2023-05-29 DIAGNOSIS — E782 Mixed hyperlipidemia: Secondary | ICD-10-CM | POA: Diagnosis not present

## 2023-05-29 NOTE — Patient Instructions (Signed)
 Medication Instructions:  No medication changes. *If you need a refill on your cardiac medications before your next appointment, please call your pharmacy*  Lab Work: None ordered today. If you have labs (blood work) drawn today and your tests are completely normal, you will receive your results only by: MyChart Message (if you have MyChart) OR A paper copy in the mail If you have any lab test that is abnormal or we need to change your treatment, we will call you to review the results.  Testing/Procedures: None ordered today.  Follow-Up: At [facility], you and your health needs are our priority.  As part of our continuing mission to provide you with exceptional heart care, our providers are all part of one team.  This team includes your primary Cardiologist (physician) and Advanced Practice Providers or APPs (Physician Assistants and Nurse Practitioners) who all work together to provide you with the care you need, when you need it.  Your next appointment:   As needed  Provider:   Knox Perl, MD

## 2023-05-29 NOTE — Progress Notes (Signed)
 Cardiology Office Note:  .   Date:  05/29/2023  ID:  Johnice Nail, DOB Jun 17, 1950, MRN [id number] PCP: Olin Bertin, MD  [facility] Providers Cardiologist:  None   History of Present Illness: .   MEILAH DELROSARIO is a 73 y.o.  female with a significant PMH of prediabetes, GERD, hyperlipidemia, hypothyroidism and coronary calcification noted on the CT scan.    She presented to the emergency room on 04/01/2022 for with palpitations after she has had acute exacerbation of her asthma and cough congestion.  She was also using albuterol  every 6 hours during that time.  EKG reviewed sinus tachycardia, was discharged home.  There was question whether she had atrial fibrillation or a flutter with 2: 1 conduction.  EKG personally reviewed, sinus tachycardia. Diagnosed with influenza B.   Due to underlying risk factors, she had an echocardiogram and modified Bruce protocol Lexiscan  nuclear stress test in April 2024 revealing normal LVEF with no significant valvular abnormality and no evidence of ischemia and achieved 5.45 METS by nuclear stress.  Overall considered low risk.  This is annual visit.  Discussed the use of AI scribe software for clinical note transcription with the patient, who gave verbal consent to proceed.  History of Present Illness JAYCE BOYKO is a 73 year old female who presents for a cardiology follow-up.  In March 2024, she experienced an episode of suspected atrial fibrillation and was diagnosed with influenza B, which exacerbated her asthma symptoms. She experiences occasional palpitations, which have improved since her ferritin levels were addressed. Her ferritin was previously very low, and she was started on Endari in January, improving her ferritin levels to 62. She has low hemoglobin and iron levels, managed with iron supplements, and feels better with improved energy levels since starting iron therapy.  She is currently taking 10 mg of simvastatin  for cholesterol  management, reduced from 20 mg due to headaches. She has stopped taking baby aspirin  due to bruising concerns and a sensitive stomach. She has a family history of heart disease, with both parents having heart-related issues in their later years. Her mother had an enlarged heart, and her father also had heart problems both lived into their 73s. She is concerned about her own heart health, particularly given her family history.  Labs   Lab Results  Component Value Date   NA 138 04/01/2022   K 3.5 04/01/2022   CO2 25 04/01/2022   GLUCOSE 143 (H) 04/01/2022   BUN 9 04/01/2022   CREATININE 0.82 04/01/2022   CALCIUM 9.3 04/01/2022   GFR 89.67 05/02/2012   GFRNONAA >60 04/01/2022      Latest Ref Rng & Units 04/01/2022   12:00 PM 08/17/2020    4:25 PM 06/20/2020    8:11 PM  BMP  Glucose 70 - 99 mg/dL 409  97  811   BUN 8 - 23 mg/dL 9  11  18    Creatinine 0.44 - 1.00 mg/dL 9.14  7.82  9.56   Sodium 135 - 145 mmol/L 138  138  136   Potassium 3.5 - 5.1 mmol/L 3.5  3.8  4.2   Chloride 98 - 111 mmol/L 103  106  102   CO2 22 - 32 mmol/L 25  26  25    Calcium 8.9 - 10.3 mg/dL 9.3  9.5  9.7       Latest Ref Rng & Units 04/01/2022   12:00 PM 08/17/2020    4:25 PM 06/20/2020    8:11  PM  CBC  WBC 4.0 - 10.5 K/uL 8.8  8.9  11.9   Hemoglobin 12.0 - 15.0 g/dL 16.1  09.6  04.5   Hematocrit 36.0 - 46.0 % 40.3  39.2  41.8   Platelets 150 - 400 K/uL 248  264  261    Lab Results  Component Value Date   HGBA1C 6.3 (H) 08/17/2020    Lab Results  Component Value Date   TSH 2.337 04/01/2022    External Labs:  PCP faxed Labs   02/01/2023:  Total cholesterol 180, triglycerides 112, HDL 76, LDL 86.  TSH 2.19   WBC 6.7 4.0-11.0 K/ul    RBC 4.56 4.20-5.40 M/uL    HGB 13.0 12.0-16.0 g/dL    HCT 40.9 81.1-91.4 %    MCV 86.3 81.0-99.0 fL    MCH 28.5 27.0-33.0 pg    MCHC 33.0 32.0-36.0 g/dL    RDW 78.2 95.6-21.3 %    PLT 226 150-400 K/uL    Hgb A1c 6.2 4.8-5.6 %    05/16/2022:   Hb 11.8/HCT  37.6, platelets 245.   Serum glucose 1 1 2  mg, BUN 13, creatinine 0.78, EGFR 80 mL, sodium 141, potassium 4.3, LFTs normal.   A1c 5.4%.  TSH 3.410.   Total cholesterol 175, triglycerides 72, HDL 78, LDL 83.  ROS  Review of Systems  Cardiovascular:  Positive for dyspnea on exertion (chronic episodic asthma) and palpitations (occasional and brief). Negative for chest pain and leg swelling.    Physical Exam:   VS:  BP (!) 98/56 (BP Location: Left Arm, Patient Position: Sitting, Cuff Size: Normal)   Pulse 64   Ht 5\' 3"  (1.6 m)   Wt 148 lb 6.4 oz (67.3 kg)   LMP 01/08/2009   SpO2 98%   BMI 26.29 kg/m    Wt Readings from Last 3 Encounters:  05/29/23 148 lb 6.4 oz (67.3 kg)  05/31/22 146 lb (66.2 kg)  04/05/22 141 lb (64 kg)    Physical Exam Neck:     Vascular: No carotid bruit or JVD.  Cardiovascular:     Rate and Rhythm: Normal rate and regular rhythm.     Pulses: Intact distal pulses.     Heart sounds: Normal heart sounds. No murmur heard.    No gallop.  Pulmonary:     Effort: Pulmonary effort is normal.     Breath sounds: Normal breath sounds.  Abdominal:     General: Bowel sounds are normal.     Palpations: Abdomen is soft.  Musculoskeletal:     Right lower leg: No edema.     Left lower leg: No edema.    Studies Reviewed: Aaron Aas     EKG:    EKG Interpretation Date/Time:  Wednesday May 29 2023 13:41:41 EDT Ventricular Rate:  64 PR Interval:  154 QRS Duration:  86 QT Interval:  386 QTC Calculation: 398 R Axis:   37  Text Interpretation: EKG 05/29/2023: Normal sinus rhythm with rate of 64 bpm, normal axis, no evidence of ischemia, normal EKG.  Compared to 04/01/2022, sinus tachycardia at rate of 106 bpm. Confirmed by Demir Titsworth, Jagadeesh ([id number]) on 05/29/2023 1:45:38 PM    Medications and allergies    Allergies  Allergen Reactions   Chocolate Hives and Itching   Chocolate Flavoring Agent (Non-Screening) Other (See Comments)   Erythromycin Diarrhea   Erythromycin  Base     Other Reaction(s): Diarrhea   Iron Itching    Other Reaction(s): itching & rashes   Pneumococcal  13-Val Conj Vacc     Other Reaction(s): Rash, sore arm, fever   Pneumococcal Vaccines     Fever    Prednisone     Stomach burning    Shrimp Flavor Agent (Non-Screening) Other (See Comments)   Shrimp [Shellfish Allergy]     hives   Sulfa Antibiotics Diarrhea   Tetanus-Diphtheria Toxoids Td     Other Reaction(s): Fever, sore arm     Current Outpatient Medications:    ACCU-CHEK GUIDE test strip, , Disp: , Rfl:    albuterol  (PROAIR  HFA) 108 (90 Base) MCG/ACT inhaler, Inhale 1-2 puffs into the lungs every 6 (six) hours as needed. (Patient taking differently: Inhale 1-2 puffs into the lungs every 6 (six) hours as needed for shortness of breath or wheezing.), Disp: 8 g, Rfl: 2   ALPRAZolam (XANAX) 0.25 MG tablet, Take 0.25-0.5 mg by mouth 2 (two) times daily as needed., Disp: , Rfl:    aspirin  EC 81 MG tablet, Take 1 tablet (81 mg total) by mouth daily., Disp: 90 tablet, Rfl: 3   betamethasone dipropionate 0.05 % cream, Apply 1 application topically daily as needed for rash., Disp: , Rfl:    bismuth subsalicylate (PEPTO BISMOL) 262 MG/15ML suspension, Take 30 mLs by mouth every 6 (six) hours as needed for indigestion., Disp: , Rfl:    cholecalciferol (VITAMIN D3) 25 MCG (1000 UNIT) tablet, Take 1,000 Units by mouth daily., Disp: , Rfl:    clobetasol  ointment (TEMOVATE ) 0.05 %, Apply topically 2 (two) times daily. Use only when has symptoms and do not use for more than 5 days., Disp: 30 g, Rfl: 1   desoximetasone (TOPICORT) 0.25 % cream, Apply 1 application topically daily as needed (Arm rash)., Disp: , Rfl:    esomeprazole  (NEXIUM ) 40 MG capsule, Take 1 capsule (40 mg total) by mouth 2 (two) times daily. (Patient taking differently: Take 40 mg by mouth daily.), Disp: 60 capsule, Rfl: 10   estradiol  (ESTRACE ) 0.1 MG/GM vaginal cream, USE PEA SIZED AMOUNT OF CREAM EXTERNALLY EVERY MON, WED,  AND SAT, Disp: 42.5 g, Rfl: 2   Fe Fum-FePoly-Vit C-Vit B3 (INTEGRA) 62.5-62.5-40-3 MG CAPS, Take 1 capsule by mouth daily., Disp: , Rfl:    fexofenadine (ALLEGRA) 180 MG tablet, Take 180 mg by mouth at bedtime as needed for allergies., Disp: , Rfl:    fluticasone (FLONASE) 50 MCG/ACT nasal spray, Place 1 spray into both nostrils daily as needed for allergies., Disp: , Rfl: 11   levothyroxine (SYNTHROID, LEVOTHROID) 25 MCG tablet, Take 25-50 mcg by mouth See admin instructions. Take 50 mcg daily on Mon, Tues, Wed, Thurs, and Fri. Take 25 mcg daily on Sat and Sun, Disp: , Rfl:    loratadine (CLARITIN) 10 MG tablet, Take 10 mg by mouth daily as needed for allergies., Disp: , Rfl:    meclizine (ANTIVERT) 25 MG tablet, Take 25 mg by mouth as needed., Disp: , Rfl:    naproxen sodium (ALEVE) 220 MG tablet, Take 440 mg by mouth 2 (two) times daily as needed (pain)., Disp: , Rfl:    Polyethyl Glycol-Propyl Glycol 0.4-0.3 % SOLN, Place 1 drop into both eyes at bedtime., Disp: , Rfl:    Probiotic Product (VSL#3 PO), Take 1 capsule by mouth daily., Disp: , Rfl:    simvastatin  (ZOCOR ) 20 MG tablet, Take 1 tablet (20 mg total) by mouth at bedtime., Disp: 90 tablet, Rfl: 3   colestipol (COLESTID) 1 g tablet, Take 1 g by mouth 2 (two) times daily., Disp: ,  Rfl:    doxycycline  (VIBRAMYCIN ) 100 MG capsule, Take 1 capsule (100 mg total) by mouth every 12 (twelve) hours. (Patient not taking: Reported on 05/29/2023), Disp: 14 capsule, Rfl: 0   No orders of the defined types were placed in this encounter.    There are no discontinued medications.   ASSESSMENT AND PLAN: .      ICD-10-CM   1. Coronary atherosclerosis due to calcified coronary lesion  I25.10 EKG 12-Lead   I25.84     2. Mixed hyperlipidemia  E78.2       Assessment and Plan Assessment & Plan Coronary atherosclerosis   Mild plaque buildup in the aorta and heart was noted on previous imaging. Simvastatin  10 mg effectively manages her cholesterol  levels and is crucial for preventing myocardial infarction and cerebrovascular accidents.  She was concerned about potential statin-related memory issues but was reassured that statins do not typically cause cognitive impairment and may protect against cognitive decline. Aspirin  was discussed for myocardial infarction prevention, but due to bruising and gastrointestinal sensitivity, it was discontinued.   If she chooses to resume aspirin , she may try it three times weekly while monitoring for bruising or gastrointestinal irritation. Continue simvastatin  10 mg daily. Discontinue aspirin  due to bruising and gastrointestinal sensitivity, but consider three times weekly if tolerated. Report any symptoms of angina or dyspnea.  Tricuspid valve regurgitation   Mild tricuspid valve regurgitation was observed on echocardiogram but is not audible on auscultation. This is a common finding without significant clinical symptoms or risk, and no further evaluation is indicated at this point.  She is otherwise stable from cardiac standpoint, I will see her back on a as needed basis.  Total time spent 37 minutes in review of her chart, discussion regarding anemia, iron deficiency and coronary atherosclerosis and reassurance     Signed,  Knox Perl, MD, [facility] 05/29/2023, 1:49 PM [facility] [address] [place], [place] [address] Phone: [phone]. Fax:  [phone]

## 2023-05-30 ENCOUNTER — Other Ambulatory Visit (HOSPITAL_BASED_OUTPATIENT_CLINIC_OR_DEPARTMENT_OTHER): Payer: Self-pay | Admitting: *Deleted

## 2023-05-30 ENCOUNTER — Ambulatory Visit (HOSPITAL_BASED_OUTPATIENT_CLINIC_OR_DEPARTMENT_OTHER): Payer: Self-pay | Admitting: Obstetrics & Gynecology

## 2023-05-30 DIAGNOSIS — Z780 Asymptomatic menopausal state: Secondary | ICD-10-CM

## 2023-06-10 ENCOUNTER — Ambulatory Visit: Admitting: Podiatry

## 2023-06-10 DIAGNOSIS — M79675 Pain in left toe(s): Secondary | ICD-10-CM | POA: Diagnosis not present

## 2023-06-10 DIAGNOSIS — M79674 Pain in right toe(s): Secondary | ICD-10-CM

## 2023-06-10 DIAGNOSIS — B351 Tinea unguium: Secondary | ICD-10-CM | POA: Diagnosis not present

## 2023-06-10 NOTE — Progress Notes (Signed)
 Subjective:  Patient ID: Catherine [name], female    DOB: 05-07-1950,  MRN: [id number]  Catherine [name] presents to clinic today for:  Chief Complaint  Patient presents with   [name] Problem    [name] trim    Patient notes nails are thick, discolored, elongated and painful in shoegear when trying to ambulate.  She is approximately 1 month longer than the typical 30-month [name] care appointment.  She had traveled out of the country for several weeks.  States that she caught a virus on the way back and is on the mend now.  She is feeling much better now.  PCP is Olin Bertin, MD.  Past Medical History:  Diagnosis Date   Anal fissure    Anxiety    Arthritis    bilateral knees   Asthma    Barrett esophagus    normal now   Cholelithiasis    Diabetes mellitus    Diverticulosis    Dr Grandville Lax   Gastritis    GERD (gastroesophageal reflux disease)    Hyperlipidemia    Hyperplastic polyps of stomach    Hypothyroidism    IBS (irritable bowel syndrome)    Osteopenia    Recurrent UTI    seeing Dr Dahlsted-recent culture negative   Past Surgical History:  Procedure Laterality Date   CATARACT EXTRACTION Bilateral    R, then L.  Cataracts excision   CHOLECYSTECTOMY N/A 08/23/2020   Procedure: LAPAROSCOPIC CHOLECYSTECTOMY;  Surgeon: Lockie Rima, MD;  Location: [facility] OR;  Service: General;  Laterality: N/A;   DILATION AND CURETTAGE OF UTERUS     KNEE ARTHROSCOPY Right 08/2021   partial ligmanent tear   LAPAROSCOPY N/A 05/19/2012   Procedure: LAPAROSCOPY OPERATIVE;  Surgeon: Axel Bohr, MD;  Location: [facility] ORS;  Service: Gynecology;  Laterality: N/A;   RADIOACTIVE SEED GUIDED EXCISIONAL BREAST BIOPSY Left 04/12/2020   Procedure: LEFT BREAST SEED LOCALIZED EXCISIONAL BIOPSY;  Surgeon: Lockie Rima, MD;  Location: [facility] OR;  Service: General;  Laterality: Left;   SALPINGOOPHORECTOMY Bilateral 05/19/2012   Procedure: SALPINGO OOPHORECTOMY;  Surgeon: Axel Bohr, MD;  Location: [facility]  ORS;  Service: Gynecology;  Laterality: Bilateral;  including left dermoid cyst   TUBAL LIGATION     with D&C due to Incomplete AB   Allergies  Allergen Reactions   Chocolate Hives and Itching   Chocolate Flavoring Agent (Non-Screening) Other (See Comments)   Erythromycin Diarrhea   Erythromycin Base     Other Reaction(s): Diarrhea   Iron Itching    Other Reaction(s): itching & rashes   Pneumococcal 13-Val Conj Vacc     Other Reaction(s): Rash, sore arm, fever   Pneumococcal Vaccines     Fever    Prednisone     Stomach burning    Shrimp Flavor Agent (Non-Screening) Other (See Comments)   Shrimp [Shellfish Allergy]     hives   Sulfa Antibiotics Diarrhea   Tetanus-Diphtheria Toxoids Td     Other Reaction(s): Fever, sore arm    Review of Systems: Negative except as noted in the HPI.  Objective:  Catherine [name] is a pleasant 73 y.o. female in NAD. AAO x 3.  Vascular Examination: Capillary refill time is 3-5 seconds to toes bilateral. Palpable pedal pulses b/l LE. Digital hair present b/l.  Skin temperature gradient WNL b/l. No varicosities b/l. No cyanosis noted b/l.   Dermatological Examination: Pedal skin with normal turgor, texture and tone b/l. No open wounds. No interdigital macerations b/l. Toenails  x10 are 3mm thick, discolored, dystrophic with subungual debris. There is pain with compression of the [name] plates.  They are elongated x10  Assessment/Plan: 1. Pain due to onychomycosis of toenails of both feet     The mycotic toenails were sharply debrided x10 with sterile [name] nippers and a power debriding burr to decrease bulk/thickness and length.    Return in about 3 months (around 09/10/2023) for [facility].   Joe Murders, DPM, FACFAS [facility]     [address] [place], [place] [address]                Office [phone]  Fax [phone]

## 2023-06-26 DIAGNOSIS — E7800 Pure hypercholesterolemia, unspecified: Secondary | ICD-10-CM | POA: Diagnosis not present

## 2023-06-26 DIAGNOSIS — E559 Vitamin D deficiency, unspecified: Secondary | ICD-10-CM | POA: Diagnosis not present

## 2023-06-26 DIAGNOSIS — E611 Iron deficiency: Secondary | ICD-10-CM | POA: Diagnosis not present

## 2023-06-26 DIAGNOSIS — E1165 Type 2 diabetes mellitus with hyperglycemia: Secondary | ICD-10-CM | POA: Diagnosis not present

## 2023-06-26 DIAGNOSIS — E039 Hypothyroidism, unspecified: Secondary | ICD-10-CM | POA: Diagnosis not present

## 2023-07-03 DIAGNOSIS — E559 Vitamin D deficiency, unspecified: Secondary | ICD-10-CM | POA: Diagnosis not present

## 2023-07-03 DIAGNOSIS — E611 Iron deficiency: Secondary | ICD-10-CM | POA: Diagnosis not present

## 2023-07-03 DIAGNOSIS — E7800 Pure hypercholesterolemia, unspecified: Secondary | ICD-10-CM | POA: Diagnosis not present

## 2023-07-03 DIAGNOSIS — E039 Hypothyroidism, unspecified: Secondary | ICD-10-CM | POA: Diagnosis not present

## 2023-07-03 DIAGNOSIS — E1165 Type 2 diabetes mellitus with hyperglycemia: Secondary | ICD-10-CM | POA: Diagnosis not present

## 2023-07-03 DIAGNOSIS — M810 Age-related osteoporosis without current pathological fracture: Secondary | ICD-10-CM | POA: Diagnosis not present

## 2023-07-25 DIAGNOSIS — R399 Unspecified symptoms and signs involving the genitourinary system: Secondary | ICD-10-CM | POA: Diagnosis not present

## 2023-08-02 ENCOUNTER — Ambulatory Visit (HOSPITAL_BASED_OUTPATIENT_CLINIC_OR_DEPARTMENT_OTHER): Admitting: Obstetrics & Gynecology

## 2023-08-02 ENCOUNTER — Other Ambulatory Visit (HOSPITAL_COMMUNITY)
Admission: RE | Admit: 2023-08-02 | Discharge: 2023-08-02 | Disposition: A | Discharge status: Home / Self Care | Source: Ambulatory Visit | Attending: Obstetrics & Gynecology | Admitting: Obstetrics & Gynecology

## 2023-08-02 VITALS — BP 118/77 | HR 78 | Wt 148.4 lb

## 2023-08-02 DIAGNOSIS — N763 Subacute and chronic vulvitis: Secondary | ICD-10-CM | POA: Diagnosis not present

## 2023-08-02 DIAGNOSIS — Z113 Encounter for screening for infections with a predominantly sexual mode of transmission: Secondary | ICD-10-CM | POA: Insufficient documentation

## 2023-08-02 DIAGNOSIS — N3090 Cystitis, unspecified without hematuria: Secondary | ICD-10-CM

## 2023-08-02 DIAGNOSIS — B951 Streptococcus, group B, as the cause of diseases classified elsewhere: Secondary | ICD-10-CM | POA: Diagnosis not present

## 2023-08-02 DIAGNOSIS — N898 Other specified noninflammatory disorders of vagina: Secondary | ICD-10-CM

## 2023-08-02 DIAGNOSIS — N390 Urinary tract infection, site not specified: Secondary | ICD-10-CM | POA: Diagnosis not present

## 2023-08-02 DIAGNOSIS — M818 Other osteoporosis without current pathological fracture: Secondary | ICD-10-CM | POA: Diagnosis not present

## 2023-08-02 LAB — POCT URINALYSIS DIPSTICK
Bilirubin, UA: NEGATIVE
Glucose, UA: NEGATIVE
Ketones, UA: NEGATIVE
Nitrite, UA: NEGATIVE
Protein, UA: NEGATIVE
Spec Grav, UA: 1.01 (ref 1.010–1.025)
Urobilinogen, UA: 0.2 U/dL
pH, UA: 5.5 (ref 5.0–8.0)

## 2023-08-02 MED ORDER — ESTRADIOL 0.1 MG/GM VA CREA: TOPICAL_CREAM | VAGINAL | 2 refills | Status: AC

## 2023-08-02 MED ORDER — AMOXICILLIN 500 MG PO CAPS
500.0000 mg | ORAL_CAPSULE | Freq: Four times a day (QID) | ORAL | 0 refills | Status: AC
Start: 2023-08-02 — End: 2023-08-07

## 2023-08-02 MED ORDER — CLOBETASOL PROPIONATE 0.05 % EX OINT
TOPICAL_OINTMENT | Freq: Two times a day (BID) | CUTANEOUS | 1 refills | Status: AC
Start: 2023-08-02 — End: ?

## 2023-08-02 MED ORDER — FLUCONAZOLE 150 MG PO TABS: 150.0000 mg | ORAL_TABLET | Freq: Once | ORAL | 0 refills | Status: AC

## 2023-08-02 NOTE — Progress Notes (Signed)
 GYNECOLOGY  VISIT  CC:   vaginal irritation  HPI: 73 y.o. [id number] Married [place] female here for concerns about vaginal/vulvar sensitivity.  She was diagnosed with a UTI with her PCP.  She was advised the UTI came from a bad bacteria in the vagina.  She finished her antibiotics on Monday.  Was able to see results in Care Everywhere.  Culture grew GBS.  Pt treated wit nitrofurantoin .  Still with symptoms including urgency and dysuria.  GBS and treatment recommendations reviewed.  She has seen Dr. Elisabeth for hx of recurrent UTIs.  Wondering if she needs appt.  Recommend treating GBS first and seeing if culture is still positive before seeing specialist.  She is also having vulvar irritation.  H/o vulvitis.  Needs steroids refill.  Uses sparingly.  Also using vaginal estrogen cream externally up to three times weekly.  Needs RF for this as well.  Will test for other causes of vaginitis due to irritation and increased vaginal tenderness/irritation she describes today.  Unrelated, does have osteoporosis.  She does have endocrinologist, Dr. Tommas and has done some blood work with her but would like to discuss options and osteoporosis clinic was suggested.  She would like this information.  Referral will be placed.     Past Medical History:  Diagnosis Date   Anal fissure    Anxiety    Arthritis    bilateral knees   Asthma    Barrett esophagus    normal now   Cholelithiasis    Diabetes mellitus    Diverticulosis    Dr Obie   Gastritis    GERD (gastroesophageal reflux disease)    Hyperlipidemia    Hyperplastic polyps of stomach    Hypothyroidism    IBS (irritable bowel syndrome)    Osteopenia    Recurrent UTI    seeing Dr Dahlsted-recent culture negative    MEDS:   Current Outpatient Medications on File Prior to Visit  Medication Sig Dispense Refill   ACCU-CHEK GUIDE test strip      albuterol  (PROAIR  HFA) 108 (90 Base) MCG/ACT inhaler Inhale 1-2 puffs into the lungs every 6  (six) hours as needed. (Patient taking differently: Inhale 1-2 puffs into the lungs every 6 (six) hours as needed for shortness of breath or wheezing.) 8 g 2   ALPRAZolam (XANAX) 0.25 MG tablet Take 0.25-0.5 mg by mouth 2 (two) times daily as needed.     betamethasone dipropionate 0.05 % cream Apply 1 application topically daily as needed for rash.     bismuth subsalicylate (PEPTO BISMOL) 262 MG/15ML suspension Take 30 mLs by mouth every 6 (six) hours as needed for indigestion.     cholecalciferol (VITAMIN D3) 25 MCG (1000 UNIT) tablet Take 1,000 Units by mouth daily.     clobetasol  ointment (TEMOVATE ) 0.05 % Apply topically 2 (two) times daily. Use only when has symptoms and do not use for more than 5 days. 30 g 1   colestipol (COLESTID) 1 g tablet Take 1 g by mouth 2 (two) times daily.     desoximetasone (TOPICORT) 0.25 % cream Apply 1 application topically daily as needed (Arm rash).     esomeprazole  (NEXIUM ) 40 MG capsule Take 1 capsule (40 mg total) by mouth 2 (two) times daily. (Patient taking differently: Take 40 mg by mouth daily.) 60 capsule 10   estradiol  (ESTRACE ) 0.1 MG/GM vaginal cream USE PEA SIZED AMOUNT OF CREAM EXTERNALLY EVERY MON, WED, AND SAT 42.5 g 2   Fe Fum-FePoly-Vit  C-Vit B3 (INTEGRA) 62.5-62.5-40-3 MG CAPS Take 1 capsule by mouth daily.     fexofenadine (ALLEGRA) 180 MG tablet Take 180 mg by mouth at bedtime as needed for allergies.     fluticasone (FLONASE) 50 MCG/ACT nasal spray Place 1 spray into both nostrils daily as needed for allergies.  11   levothyroxine (SYNTHROID, LEVOTHROID) 25 MCG tablet Take 25-50 mcg by mouth See admin instructions. Take 50 mcg daily on Mon, Tues, Wed, Thurs, and Fri. Take 25 mcg daily on Sat and Sun     loratadine (CLARITIN) 10 MG tablet Take 10 mg by mouth daily as needed for allergies.     meclizine (ANTIVERT) 25 MG tablet Take 25 mg by mouth as needed.     naproxen sodium (ALEVE) 220 MG tablet Take 440 mg by mouth 2 (two) times daily as  needed (pain).     Polyethyl Glycol-Propyl Glycol 0.4-0.3 % SOLN Place 1 drop into both eyes at bedtime.     Probiotic Product (VSL#3 PO) Take 1 capsule by mouth daily.     simvastatin  (ZOCOR ) 20 MG tablet Take 1 tablet (20 mg total) by mouth at bedtime. 90 tablet 3   No current facility-administered medications on file prior to visit.    ALLERGIES: Chocolate, Chocolate flavoring agent (non-screening), Erythromycin, Erythromycin base, Iron, Pneumococcal 13-val conj vacc, Pneumococcal vaccines, Prednisone, Shrimp flavor agent (non-screening), Shrimp [shellfish allergy], Sulfa antibiotics, and Tetanus-diphtheria toxoids td  SH:  married, non smoker  Review of Systems  Constitutional: Negative.   Genitourinary:        Vaginal/vulvar irritation    PHYSICAL EXAMINATION:    BP 118/77 (BP Location: Left Arm, Patient Position: Sitting, Cuff Size: Normal)   Pulse 78   Wt 148 lb 6.4 oz (67.3 kg)   LMP 01/08/2009   SpO2 100%   BMI 26.29 kg/m     General appearance: alert, cooperative and appears stated age Lymph:  no inguinal LAD noted Pelvic: External genitalia:  no lesions              Urethra:  normal appearing urethra with no masses, tenderness or lesions              Bartholins and Skenes: normal                 Vagina: atrophic mucosa, no lesions               Assessment/Plan: 1. Cystitis (Primary) - POCT today with WBCs and RBCs present. - will treated GBS urine with PCN class medication.   - amoxicillin  (AMOXIL ) 500 MG capsule; Take 1 capsule (500 mg total) by mouth 4 (four) times daily for 5 days.  Dispense: 20 capsule; Refill: 0 - plan to repeat culture in about 10 days.  Future order place and lab appt scheduled - Urine Culture; Future  2. Group B streptococcal UTI   3. Chronic vulvitis - clobetasol  ointment (TEMOVATE ) 0.05 %; Apply topically 2 (two) times daily. Use only when has symptoms and do not use for more than 5 days.  Dispense: 30 g; Refill: 1 - POCT  Urinalysis Dipstick  4. Age-related osteoporosis without fracture - referral placed and information given to pt so she can call for appt as well. - Amb Referral to Osteoporosis Management   5. Vaginal discharge - Cervicovaginal ancillary only

## 2023-08-03 ENCOUNTER — Encounter (HOSPITAL_BASED_OUTPATIENT_CLINIC_OR_DEPARTMENT_OTHER): Payer: Self-pay | Admitting: Obstetrics & Gynecology

## 2023-08-05 LAB — CERVICOVAGINAL ANCILLARY ONLY
Chlamydia: NEGATIVE
Comment: NEGATIVE
Comment: NEGATIVE
Comment: NORMAL
Neisseria Gonorrhea: NEGATIVE
Trichomonas: NEGATIVE

## 2023-08-06 ENCOUNTER — Ambulatory Visit (HOSPITAL_BASED_OUTPATIENT_CLINIC_OR_DEPARTMENT_OTHER): Payer: Self-pay | Admitting: Obstetrics & Gynecology

## 2023-08-12 ENCOUNTER — Other Ambulatory Visit (HOSPITAL_BASED_OUTPATIENT_CLINIC_OR_DEPARTMENT_OTHER): Payer: Self-pay | Admitting: Obstetrics & Gynecology

## 2023-08-12 ENCOUNTER — Telehealth (HOSPITAL_BASED_OUTPATIENT_CLINIC_OR_DEPARTMENT_OTHER): Payer: Self-pay | Admitting: Obstetrics & Gynecology

## 2023-08-12 ENCOUNTER — Other Ambulatory Visit (HOSPITAL_BASED_OUTPATIENT_CLINIC_OR_DEPARTMENT_OTHER): Payer: Self-pay

## 2023-08-12 DIAGNOSIS — N3090 Cystitis, unspecified without hematuria: Secondary | ICD-10-CM | POA: Diagnosis not present

## 2023-08-12 NOTE — Telephone Encounter (Signed)
 New message    The patient received an alert on mychart labs results.   Asking for a call back to discuss.

## 2023-08-13 ENCOUNTER — Ambulatory Visit: Admitting: Family Medicine

## 2023-08-13 NOTE — Progress Notes (Signed)
 Called patient. DOB verified. Informed patient of lab results and recommendations. Patient expressed understanding. tbw

## 2023-08-14 ENCOUNTER — Other Ambulatory Visit: Payer: Self-pay | Admitting: Cardiology

## 2023-08-14 LAB — URINE CULTURE

## 2023-08-14 MED ORDER — SIMVASTATIN 20 MG PO TABS: 20.0000 mg | ORAL_TABLET | Freq: Every day | ORAL | 2 refills | Status: AC

## 2023-08-19 DIAGNOSIS — F410 Panic disorder [episodic paroxysmal anxiety] without agoraphobia: Secondary | ICD-10-CM | POA: Diagnosis not present

## 2023-08-19 DIAGNOSIS — H8110 Benign paroxysmal vertigo, unspecified ear: Secondary | ICD-10-CM | POA: Diagnosis not present

## 2023-08-19 NOTE — Telephone Encounter (Signed)
 F/u   Left a voicemail on urine results,   Asking for a call back today

## 2023-08-20 DIAGNOSIS — R42 Dizziness and giddiness: Secondary | ICD-10-CM | POA: Diagnosis not present

## 2023-08-22 NOTE — Telephone Encounter (Signed)
 F/U  The patient verbalized understanding of Catherine [name]'s recommendation regarding the test results.  Per patient request, please fax  results to [facility] and Dr. Elisabeth at [facility].

## 2023-08-26 NOTE — Telephone Encounter (Signed)
 Urine culture results faxed to [facility] and [facility]

## 2023-09-07 DIAGNOSIS — R112 Nausea with vomiting, unspecified: Secondary | ICD-10-CM | POA: Diagnosis not present

## 2023-09-07 DIAGNOSIS — R197 Diarrhea, unspecified: Secondary | ICD-10-CM | POA: Diagnosis not present

## 2023-09-11 DIAGNOSIS — F418 Other specified anxiety disorders: Secondary | ICD-10-CM | POA: Diagnosis not present

## 2023-09-11 DIAGNOSIS — H8110 Benign paroxysmal vertigo, unspecified ear: Secondary | ICD-10-CM | POA: Diagnosis not present

## 2023-09-18 DIAGNOSIS — H90A22 Sensorineural hearing loss, unilateral, left ear, with restricted hearing on the contralateral side: Secondary | ICD-10-CM | POA: Diagnosis not present

## 2023-09-18 DIAGNOSIS — H90A31 Mixed conductive and sensorineural hearing loss, unilateral, right ear with restricted hearing on the contralateral side: Secondary | ICD-10-CM | POA: Diagnosis not present

## 2023-09-18 DIAGNOSIS — R42 Dizziness and giddiness: Secondary | ICD-10-CM | POA: Diagnosis not present

## 2023-09-23 ENCOUNTER — Ambulatory Visit: Admitting: Podiatry

## 2023-12-30 ENCOUNTER — Ambulatory Visit: Admitting: Podiatry

## 2023-12-30 DIAGNOSIS — Z91199 Patient's noncompliance with other medical treatment and regimen due to unspecified reason: Secondary | ICD-10-CM

## 2023-12-30 NOTE — Progress Notes (Signed)
 Cancel 24 hours

## 2024-01-20 ENCOUNTER — Ambulatory Visit: Admitting: Podiatry

## 2024-01-20 DIAGNOSIS — B351 Tinea unguium: Secondary | ICD-10-CM

## 2024-01-20 DIAGNOSIS — M79672 Pain in left foot: Secondary | ICD-10-CM

## 2024-01-20 DIAGNOSIS — M79671 Pain in right foot: Secondary | ICD-10-CM | POA: Diagnosis not present

## 2024-01-20 NOTE — Progress Notes (Signed)
 Patient presents for evaluation and treatment of tenderness and some redness around nails feet.  Tenderness around toes with walking and wearing shoes.  Physical exam:  General appearance: Alert, pleasant, and in no acute distress.  Vascular: Pedal pulses: DP 2/4 B/L, PT 2/4 B/L. MIld edema lower legs bilaterally.  Capillary refill time immediate bilaterally  Neurologic:  Dermatologic:  Nails thickened, disfigured, discolored 1-5 BL with subungual debris.  Redness and hypertrophic nail folds along nail folds bilaterally but no signs of drainage or infection.  Nail borders especially on the second toe right are digging into the skin.  No signs of skin breakdown or infection but it is tender.  Musculoskeletal:     Diagnosis: 1. Painful onychomycotic nails 1 through 5 bilaterally. 2. Pain toes 1 through 5 bilaterally.  Plan: -Debrided onychomycotic nails 1 through 5 bilaterally.  Sharply debrided nails with nail clipper and reduced with a power bur.  Recommending soaking the right foot once or twice a day or clean with warm soapy water.  Apply some Neosporin for a week or 2 should be feeling better if not may need to consider matricectomy's on the borders of the second nail right  Return 3 months [facility]

## 2024-04-21 ENCOUNTER — Ambulatory Visit: Admitting: Podiatry

## 2024-05-21 ENCOUNTER — Ambulatory Visit: Admitting: Diagnostic Neuroimaging
